# Patient Record
Sex: Female | Born: 1940 | Race: White | Hispanic: No | Marital: Married | Smoking: Former smoker
Health system: Southern US, Community
[De-identification: ages and names within clinical notes are randomized; demographics above are authoritative.]

## PROBLEM LIST (undated history)

## (undated) DIAGNOSIS — I2510 Atherosclerotic heart disease of native coronary artery without angina pectoris: Secondary | ICD-10-CM

## (undated) DIAGNOSIS — C541 Malignant neoplasm of endometrium: Secondary | ICD-10-CM

## (undated) DIAGNOSIS — IMO0001 Reserved for inherently not codable concepts without codable children: Secondary | ICD-10-CM

## (undated) DIAGNOSIS — IMO0002 Reserved for concepts with insufficient information to code with codable children: Secondary | ICD-10-CM

## (undated) DIAGNOSIS — I10 Essential (primary) hypertension: Secondary | ICD-10-CM

## (undated) HISTORY — PX: ABDOMINAL HYSTERECTOMY: SHX81

## (undated) HISTORY — PX: CHOLECYSTECTOMY: SHX55

## (undated) HISTORY — DX: Reserved for inherently not codable concepts without codable children: IMO0001

## (undated) HISTORY — DX: Reserved for concepts with insufficient information to code with codable children: IMO0002

---

## 2015-02-13 HISTORY — PX: ROBOTIC ASSISTED TOTAL HYSTERECTOMY WITH BILATERAL SALPINGO OOPHERECTOMY: SHX6086

## 2015-05-15 ENCOUNTER — Telehealth: Payer: Self-pay | Admitting: *Deleted

## 2015-05-15 NOTE — Telephone Encounter (Signed)
Requested CDs and office notes from Dr. Polly Cobia.  Have pathology

## 2015-05-23 ENCOUNTER — Encounter: Payer: Self-pay | Admitting: Radiation Oncology

## 2015-05-23 NOTE — Progress Notes (Signed)
GYN Location of Tumor / Histology: endometrial cancer  Gina Duncan presented with having postmenopausal bleeding for approximately 2-3 months.   Biopsies revealed:   02/15/15 DIAGNOSIS   1. UTERUS AND BILATERAL ADNEXAE, HYSTERECTOMY AND BILATERAL  SALPINGO-OOPHORECTOMY:  INVASIVE ENDOMETRIAL ADENOCARCINOMA, ENDOMETRIOID TYPE, FIGO GRADE 2  (OF 3).  CERVIX, ATROPHY; NEGATIVE FOR ADENOCARCINOMA.  BILATERAL OVARIES, NEGATIVE FOR ADENOCARCINOMA.  BILATERAL FALLOPIAN TUBES, NEGATIVE FOR ADENOCARCINOMA.  SEE TABLE.  (NLG[RSB])   2. LEFT PELVIC LYMPH NODES, REGIONAL DISSECTION:  SIX LYMPH NODES (OF 6) NEGATIVE FOR CARCINOMA.  (RSB)   3. RIGHT PELVIC LYMPH NODES, REGIONAL DISSECTION:  SEVEN LYMPH NODES (OF 7), NEGATIVE FOR CARCINOMA.  (RSB)     Past/Anticipated interventions by Gyn/Onc surgery, if any: February 13, 2015 where she underwent an uncomplicated robotic hysterectomy with BSO and bilateral pelvic lymph node dissection   Past/Anticipated interventions by medical oncology, if any: Dr. Polly Cobia recommends vaginal cuff brachyetherapy  Weight changes, if any: no  Bowel/Bladder complaints, if any: no  Nausea/Vomiting, if any: no  Pain issues, if any:  no  SAFETY ISSUES:  Prior radiation? no  Pacemaker/ICD? no  Possible current pregnancy? no  Is the patient on methotrexate? no  Current Complaints / other details:  Patient is here with her husband.  She has two children.  Her daughter had breast cancer and recently had chemotherapy here.  BP 189/83 mmHg  Pulse 78  Temp(Src) 98 F (36.7 C) (Oral)  Ht 5\' 5"  (1.651 m)  Wt 190 lb 8 oz (86.41 kg)  BMI 31.70 kg/m2  SpO2 98%

## 2015-05-24 ENCOUNTER — Ambulatory Visit
Admission: RE | Admit: 2015-05-24 | Discharge: 2015-05-24 | Disposition: A | Discharge status: Home / Self Care | Payer: Medicare FFS | Source: Ambulatory Visit | Attending: Radiation Oncology | Admitting: Radiation Oncology

## 2015-05-24 ENCOUNTER — Encounter: Payer: Self-pay | Admitting: Radiation Oncology

## 2015-05-24 VITALS — BP 189/83 | HR 78 | Temp 98.0°F | Ht 65.0 in | Wt 190.5 lb

## 2015-05-24 DIAGNOSIS — Z90722 Acquired absence of ovaries, bilateral: Secondary | ICD-10-CM | POA: Diagnosis not present

## 2015-05-24 DIAGNOSIS — Z510 Encounter for antineoplastic radiation therapy: Secondary | ICD-10-CM | POA: Insufficient documentation

## 2015-05-24 DIAGNOSIS — C541 Malignant neoplasm of endometrium: Secondary | ICD-10-CM | POA: Diagnosis present

## 2015-05-24 DIAGNOSIS — Z803 Family history of malignant neoplasm of breast: Secondary | ICD-10-CM | POA: Insufficient documentation

## 2015-05-24 DIAGNOSIS — Z90710 Acquired absence of both cervix and uterus: Secondary | ICD-10-CM | POA: Insufficient documentation

## 2015-05-24 HISTORY — DX: Essential (primary) hypertension: I10

## 2015-05-24 HISTORY — DX: Malignant neoplasm of endometrium: C54.1

## 2015-05-24 NOTE — Progress Notes (Signed)
Radiation Oncology         [phone] ________________________________  Initial Outpatient Consultation  Name: Gina Duncan MRN: [id number]  Date: 05/24/2015  DOB: 1940-11-12  CC:No primary care provider on file.  Hart Rochester, MD   REFERRING PHYSICIAN: Hart Rochester, MD  DIAGNOSIS: INVASIVE ENDOMETRIAL ADENOCARCINOMA, FIGO Stage IB, grade 2  HISTORY OF PRESENT ILLNESS:Gina Duncan is a 74 y.o. female who is presenting to clinic in regards to her invasive endometrial adenocarcinoma. Unusual vaginal bleeding caused the patient to seek medical attention. Upon examination, Dr. Rosario Adie, MD recommended a biopsy be completed. Biopsy of endometrium showed evidence of endometrial cancer. Dr. Rosario Adie, MD referred the patient to Dr. Polly Cobia, MD for further evaluation. Patient was taken to the operating room on 02/13/2015 at which time she underwent a robotic hysterectomy with BSO and bilateral pelvic node dissection. The patient was found to have a FIGO grade 2 endometrial adenocarcinoma of the uterus. The tumor measured 3.5 x 2.3 x 0.9 cm. Tumor invaded 0.8 cm out of a 1.1 cm myometrial thickness (73%). 13 pelvic lymph nodes were negative for metastatic disease. There was no evidence of lymphovascular space invasion. In light of the above pathologic findings the patient is now referred to radiation oncology for consideration for vaginal brachytherapy as adjuvant treatment.    She reports no symptoms of pain, fatigue, dizziness, difficulty breathing or change in appetite. She reports that she likes to garden, visit the [facility] and use the treadmill. "[place]," the patient's husband confirmed. She presents with no complaint in regards to her recovery from her most recent surgery. She projected a health mental status and was accompanied by her husband. They both currently live in [place], [place] but did not feel comfortable seeking treatment at local  facility. The patient's daughter has underwent chemotherapy here for breast cancer. She expressed that she was scared that she would "start bleeding" again. This concern was addressed.   PREVIOUS RADIATION THERAPY: No  PAST MEDICAL HISTORY:  has a past medical history of Endometrial cancer and Hypertension.    PAST SURGICAL HISTORY: Past Surgical History  Procedure Laterality Date  . Robotic assisted total hysterectomy with bilateral salpingo oopherectomy  02/13/15    by Dr. Polly Cobia    FAMILY HISTORY: family history includes Breast cancer in her daughter.  SOCIAL HISTORY:  reports that she has never smoked. She has never used smokeless tobacco. She reports that she does not drink alcohol or use illicit drugs.  ALLERGIES: Review of patient's allergies indicates no known allergies.  MEDICATIONS:  Current Outpatient Prescriptions  Medication Sig Dispense Refill  . acetaminophen (TYLENOL) 325 MG tablet Take 650 mg by mouth.    Marland Kitchen aspirin 81 MG EC tablet Take 81 mg by mouth.    Marland Kitchen omeprazole (PRILOSEC) 20 MG capsule Take 20 mg by mouth.    . propranolol ER (INDERAL LA) 80 MG 24 hr capsule Take 80 mg by mouth.    . triamterene-hydrochlorothiazide (DYAZIDE) 37.5-25 MG per capsule Take 1 capsule by mouth.     No current facility-administered medications for this encounter.    REVIEW OF SYSTEMS:  A 15 point review of systems is documented in the electronic medical record. This was obtained by the nursing staff. However, I reviewed this with the patient to discuss relevant findings and make appropriate changes.   She reports no symptoms of pain, fatigue, dizziness, difficulty breathing or change in appetite. She reports that she  likes to garden, visit the [facility] and use the treadmill. "[place]," the patient's husband confirmed. She presents with no complaint in regards to her recovery from her most recent surgery. She projected a health mental status and was accompanied by her  husband. They both currently live in [place], [place] but did not feel comfortable seeking treatment at local facility. The patient's daughter has underwent chemotherapy here for breast cancer. She expressed that she was scared that she would "start bleeding" again. This concern was addressed.   PHYSICAL EXAM:  height is 5\' 5"  (1.651 m) and weight is 190 lb 8 oz (86.41 kg). Her oral temperature is 98 F (36.7 C). Her blood pressure is 189/83 and her pulse is 78. Her oxygen saturation is 98%.   BP 189/83 mmHg  Pulse 78  Temp(Src) 98 F (36.7 C) (Oral)  Ht 5\' 5"  (1.651 m)  Wt 190 lb 8 oz (86.41 kg)  BMI 31.70 kg/m2  SpO2 98%  The patient is alert and oriented x3. There is no significant changes to the status of the paients overall health to be noted at this time. Lungs are clear to auscultation bilaterally. Heart has regular rate and rhythm. No palpable cervical, supraclavicular, or axillary adenopathy. Upon physical exam, the patient's surgical scars were noted to heal without sign of swelling, infection, or other irritation.  Abdomen: 4 laparotomy scars which are well healed. Soft, non-tender, bowel sounds active all four quadrants,    no masses, no organomegaly. Pelvic exam deferred until planning day.                                                                  ECOG = 0  LABORATORY DATA:  No results found for: WBC, HGB, HCT, MCV, PLT, NEUTROABS No results found for: NA, K, CL, CO2, GLUCOSE, CREATININE, CALCIUM    RADIOGRAPHY: No results found.    IMPRESSION:  INVASIVE ENDOMETRIAL ADENOCARCINOMA, FIGO Stage IB, grade 2. Patient would be at risk for vaginal cuff recurrence given her pathologic findings. I would recommend high-dose-rate brachytherapy treatments directed at the proximal vagina. Recent exam by Dr. Polly Cobia shows the vaginal cuff to be well healed and intact.  PLAN: The benefits, risks, and purpose of vaginal brachytherapy were reviewed in  detail, including statistics from reputable studies. The patient was educated on common symptoms to expect during radiation treatment and healthy methods of management to address these symptoms if they are to occur. The patient was educated on the traditional process she will experience during vaginal brachytherapy treatments, including the purpose and definition of a CT scan, simulation planning session, and the appropriate time frame to set aside for her 5 sessions of treatment. She will be treated with iridium 192 as the high-dose-rate source She was sent home with additional educational resources that she can access via the Internet. She is aware of her CT scan and simulation planning session to take place as scheduled. She is aware of her follow-up appointment with radiation oncology to take place as scheduled and to notify Dr. Sondra Come, MD if any symptoms of concern occur. All vocalized questions and concerns have been addressed. If the patient develops any further questions or concerns in regards to her treatment and recovery,  she has been encouraged to contact Dr. Sondra Come, MD. Consent documentation was reviewed and signed.    This document serves as a record of services personally performed by Gery Pray, MD. It was created on his behalf by Lenn Cal, a trained medical scribe. The creation of this record is based on the scribe's personal observations and the provider's statements to them. This document has been checked and approved by the attending provider.    ------------------------------------------------  Blair Promise, PhD, MD

## 2015-05-25 ENCOUNTER — Telehealth: Payer: Self-pay | Admitting: *Deleted

## 2015-05-25 NOTE — Telephone Encounter (Signed)
CALLED PATIENT TO INFORM OF NEW HDR VAG. CUFF CASE, SPOKE WITH PATIENT AND SHE IS AWARE OF THESE APPTS. 

## 2015-05-31 ENCOUNTER — Telehealth: Payer: Self-pay | Admitting: *Deleted

## 2015-05-31 NOTE — Telephone Encounter (Signed)
CALLED PATIENT TO REMIND OF HDR VAG. CUFF CASE FOR 06-01-15, LVM FOR A RETURN CALL

## 2015-06-01 ENCOUNTER — Ambulatory Visit
Admission: RE | Admit: 2015-06-01 | Discharge: 2015-06-01 | Disposition: A | Discharge status: Home / Self Care | Payer: Medicare FFS | Source: Ambulatory Visit | Attending: Radiation Oncology | Admitting: Radiation Oncology

## 2015-06-01 ENCOUNTER — Encounter: Payer: Self-pay | Admitting: Radiation Oncology

## 2015-06-01 VITALS — BP 194/105 | HR 72 | Temp 98.2°F | Resp 16 | Ht 65.0 in | Wt 190.0 lb

## 2015-06-01 DIAGNOSIS — C541 Malignant neoplasm of endometrium: Secondary | ICD-10-CM

## 2015-06-01 DIAGNOSIS — Z510 Encounter for antineoplastic radiation therapy: Secondary | ICD-10-CM | POA: Diagnosis not present

## 2015-06-01 NOTE — Progress Notes (Signed)
Gina Duncan here for follow up.  She denies pain and does not have any vaginal bleeding.  Her bp was high today at 194/105.  She did take inderal and dyazide this morning.  BP 194/105 mmHg  Pulse 72  Temp(Src) 98.2 F (36.8 C) (Oral)  Resp 16  Ht 5\' 5"  (1.651 m)  Wt 190 lb (86.183 kg)  BMI 31.62 kg/m2

## 2015-06-01 NOTE — Progress Notes (Signed)
  Radiation Oncology         [phone] ________________________________  Name: Gina Duncan. Levenhagen MRN: [id number]  Date: 06/01/2015  DOB: 1941-08-31   HDR BRACHYTHERAPY   DIAGNOSIS: INVASIVE ENDOMETRIAL ADENOCARCINOMA, FIGO Stage IB, grade 2   Vaginal brachytherapy procedure  NARRATIVE: The patient was taken to the high-dose-rate suite and placed in the dorsolithotomy position. The patient's custom vaginal cylinder was placed in the proximal vagina. This was affixed to the CT/MR stabilization plate to prevent slippage. A fiducial marker was placed within the vaginal cylinder. The patient tolerated the vaginal brachytherapy procedure well   Verification simulation note  An AP and lateral film was obtained of the pelvis documenting accurate position of the vaginal cylinder for treatment.   HDR brachytherapy treatment  The remote afterloading catheter was attached to the vaginal cylinder. The patient then proceeded to undergo her first high-dose-rate treatment directed at the vaginal cuff region. The patient was prescribed a dose of 6 gray to be delivered to the vaginal mucosal surface. This was achieved with a total dwell time of 401.1 seconds. The patient was treated with 1 channel using multiple dwell positions. A 4 cm treatment length was prescribed for the patient. Patient tolerated the procedure well. After completion of her treatment a radiation survey was performed documenting return of the iridium source into the gamma med safe.  ________________________________  Blair Promise, PhD, MD

## 2015-06-01 NOTE — Progress Notes (Signed)
  Radiation Oncology         [phone] ________________________________  Name: Gina Duncan. Leyendecker MRN: [id number]  Date: 06/01/2015  DOB: Nov 09, 1940  Simple treatment device  Note  CC: No primary care provider on file.  Hart Rochester, MD    ICD-9-CM ICD-10-CM   1. Endometrial cancer 182.0 C54.1     Diagnosis:   Endometrial cancer    Narrative: Today the patient had construction of her custom vaginal cylinder. She will be treated with a 3 cm segmented cylinder. This was the optimal diameter to distend the vaginal vault without undue discomfort. ____________________________________ Gery Pray, MD

## 2015-06-01 NOTE — Progress Notes (Signed)
  Radiation Oncology         [phone]) [phone] ________________________________  Name: Gina Duncan MRN: [id number]  Date: 06/01/2015  DOB: November 25, 1940  SIMULATION AND TREATMENT PLANNING NOTE HDR BRACHYTHERAPY  DIAGNOSIS: INVASIVE ENDOMETRIAL ADENOCARCINOMA, FIGO Stage IB, grade 2   NARRATIVE:  The patient was brought to the [facility].  Identity was confirmed.  All relevant records and images related to the planned course of therapy were reviewed.  The patient freely provided informed written consent to proceed with treatment after reviewing the details related to the planned course of therapy. The consent form was witnessed and verified by the simulation staff.  Then, the patient was set-up in a stable reproducible  supine position for radiation therapy.  CT images were obtained.  Surface markings were placed.  The CT images were loaded into the planning software.  Then the target and avoidance structures were contoured.  Treatment planning then occurred.  The radiation prescription was entered and confirmed.   I have requested : Brachytherapy Isodose Plan and Dosimetry Calculations to plan the radiation distribution.    PLAN:  The patient will receive 30 Gy in 5 fractions. Patient will be treated with a 3 cm diameter cylinder. Treatment length will be 4 cm. Prescription will be to the vaginal mucosal surface. Iridium 192 will be the high-dose-rate source    ________________________________  Blair Promise, PhD, MD

## 2015-06-01 NOTE — Progress Notes (Signed)
  Radiation Oncology         [phone] ________________________________  Name: Gina Duncan MRN: [id number]  Date: 06/01/2015  DOB: 25-Dec-1940  Vaginal brachytherapy procedure Note  CC: No primary care provider on file.  Hart Rochester, MD    ICD-9-CM ICD-10-CM   1. Endometrial cancer 182.0 C54.1     Diagnosis:  INVASIVE ENDOMETRIAL ADENOCARCINOMA, FIGO Stage IB, grade 2     Narrative:  The patient returns today for for planning and her first high-dose-rate radiation treatment directed at the proximal vagina.  Patient denies any new problems since her initial consultation September 22.  Physical Findings: The patient is in no acute distress. Patient is alert and oriented.  height is 5\' 5"  (1.651 m) and weight is 190 lb (86.183 kg). Her oral temperature is 98.2 F (36.8 C). Her blood pressure is 194/105 and her pulse is 72. Her respiration is 16. .    The patient was taken to the nursing suite and placed in the dorsal lithotomy position. A pelvic exam was performed. No mucosal lesions were noted in the vaginal vault. The vaginal cuff was noted to be intact. No pelvic masses appreciated. The patient proceeded to undergo fitting for her vaginal cylinder. The optimal diameter to distend the vaginal vault without undue discomfort was a 3 cm diameter cylinder.  The patient tolerated the procedure well.                             Impression:  Patient underwent successful fitting for her vaginal brachytherapy procedure  Plan:  Patient will be transferred to the [facility] for planning and eventual treatment later today.  ____________________________________ Gery Pray, MD

## 2015-06-11 ENCOUNTER — Telehealth: Payer: Self-pay | Admitting: *Deleted

## 2015-06-11 NOTE — Telephone Encounter (Signed)
CALLED PATIENT TO REMIND OF HDR TX. FOR 06-12-15 @ 11 AM, SPOKE WITH PATIENT AND SHE IS AWARE OF THIS APPT.

## 2015-06-12 ENCOUNTER — Ambulatory Visit
Admission: RE | Admit: 2015-06-12 | Discharge: 2015-06-12 | Disposition: A | Discharge status: Home / Self Care | Payer: Medicare FFS | Source: Ambulatory Visit | Attending: Radiation Oncology | Admitting: Radiation Oncology

## 2015-06-12 DIAGNOSIS — Z510 Encounter for antineoplastic radiation therapy: Secondary | ICD-10-CM | POA: Diagnosis not present

## 2015-06-12 DIAGNOSIS — C541 Malignant neoplasm of endometrium: Secondary | ICD-10-CM

## 2015-06-12 NOTE — Progress Notes (Signed)
Expand All Collapse All     Radiation Oncology [phone]) [phone] ________________________________  Name: Gina Duncan. MatherlyMRN: [id number] Date: 10/11/16DOB: 1941/08/25  Simple treatment device Note  CC: No primary care provider on file. Hart Rochester, MD    ICD-9-CM ICD-10-CM   1. Endometrial cancer 182.0 C54.1     Diagnosis: Endometrial cancer    Narrative: Today the patient had construction of her custom vaginal cylinder. She will be treated with a 3 cm segmented cylinder. This was the optimal diameter to distend the vaginal vault without undue discomfort. ____________________________________

## 2015-06-12 NOTE — Progress Notes (Signed)
Expand All Collapse All    Radiation Oncology [phone] ________________________________  Name: Gina Gordon. MatherlyMRN: [id number] Date: 10/11/16DOB: Dec 06, 1940   HDR BRACHYTHERAPY   DIAGNOSIS: INVASIVE ENDOMETRIAL ADENOCARCINOMA, FIGO Stage IB, grade 2   Vaginal brachytherapy procedure  NARRATIVE: The patient was taken to the high-dose-rate suite and placed in the dorsolithotomy position. The patient's custom vaginal cylinder was placed in the proximal vagina. This was affixed to the CT/MR stabilization plate to prevent slippage. A fiducial marker was placed within the vaginal cylinder. The patient tolerated the vaginal brachytherapy procedure well   Verification simulation note  An AP and lateral film was obtained of the pelvis documenting accurate position of the vaginal cylinder for treatment.   HDR brachytherapy treatment  The remote afterloading catheter was attached to the vaginal cylinder. The patient then proceeded to undergo her second high-dose-rate treatment directed at the vaginal cuff region. The patient was prescribed a dose of 6 gray to be delivered to the vaginal mucosal surface. This was achieved with a total dwell time of 204.7 seconds. The patient was treated with 1 channel using multiple dwell positions. A 4 cm treatment length was prescribed for the patient. Patient tolerated the procedure well. After completion of her treatment a radiation survey was performed documenting return of the iridium source into the gamma med safe.  ________________________________  Blair Promise, PhD, MD

## 2015-06-14 ENCOUNTER — Telehealth: Payer: Self-pay | Admitting: *Deleted

## 2015-06-14 NOTE — Telephone Encounter (Signed)
CALLED PATIENT TO REMIND OF HDR TX. FOR 06-15-15 @ 10 AM, SPOKE WITH PATIENT AND SHE IS AWARE OF THIS [place].

## 2015-06-15 ENCOUNTER — Ambulatory Visit
Admission: RE | Admit: 2015-06-15 | Discharge: 2015-06-15 | Disposition: A | Discharge status: Home / Self Care | Payer: Medicare FFS | Source: Ambulatory Visit | Attending: Radiation Oncology | Admitting: Radiation Oncology

## 2015-06-15 DIAGNOSIS — C541 Malignant neoplasm of endometrium: Secondary | ICD-10-CM

## 2015-06-15 DIAGNOSIS — Z510 Encounter for antineoplastic radiation therapy: Secondary | ICD-10-CM | POA: Diagnosis not present

## 2015-06-15 NOTE — Progress Notes (Signed)
Expand All Collapse All     Radiation Oncology [phone] ________________________________  Name: Gina Gordon. MatherlyMRN: [id number] Date: 10/14/16DOB: 1941-02-26  Simple treatment device Note  CC: No primary care provider on file. Hart Rochester, MD    ICD-9-CM ICD-10-CM   1. Endometrial cancer 182.0 C54.1     Diagnosis: Endometrial cancer    Narrative: Today the patient had construction of her custom vaginal cylinder. She will be treated with a 3 cm segmented cylinder. This was the optimal diameter to distend the vaginal vault without undue discomfort. ____________________________________ Gery Pray, MD

## 2015-06-15 NOTE — Progress Notes (Signed)
Expand All Collapse All    Expand All Collapse All   Radiation Oncology [phone] ________________________________  Name: Gina Duncan. MatherlyMRN: [id number] Date: 10/14/16DOB: 1941-05-18   HDR BRACHYTHERAPY   DIAGNOSIS: INVASIVE ENDOMETRIAL ADENOCARCINOMA, FIGO Stage IB, grade 2    Vaginal brachytherapy procedure  NARRATIVE: The patient was taken to the high-dose-rate suite and placed in the dorsolithotomy position. The patient's custom vaginal cylinder was placed in the proximal vagina. This was affixed to the CT/MR stabilization plate to prevent slippage. A fiducial marker was placed within the vaginal cylinder. The patient tolerated the vaginal brachytherapy procedure well   Verification simulation note  An AP and lateral film was obtained of the pelvis documenting accurate position of the vaginal cylinder for treatment.   HDR brachytherapy treatment  The remote afterloading catheter was attached to the vaginal cylinder. The patient then proceeded to undergo her third high-dose-rate treatment directed at the vaginal cuff region. The patient was prescribed a dose of 6 gray to be delivered to the vaginal mucosal surface. This was achieved with a total dwell time of 210.5 seconds. The patient was treated with 1 channel using multiple dwell positions. A 4 cm treatment length was prescribed for the patient. Patient tolerated the procedure well. After completion of her treatment a radiation survey was performed documenting return of the iridium source into the gamma med safe.  ________________________________  Blair Promise, PhD, MD

## 2015-06-18 ENCOUNTER — Telehealth: Payer: Self-pay | Admitting: *Deleted

## 2015-06-18 NOTE — Telephone Encounter (Signed)
Called patient to remind of HDR Tx. For 06-19-15 @ 10 am, lvm for a return call

## 2015-06-19 ENCOUNTER — Encounter: Payer: Self-pay | Admitting: Radiation Oncology

## 2015-06-19 ENCOUNTER — Ambulatory Visit
Admission: RE | Admit: 2015-06-19 | Discharge: 2015-06-19 | Disposition: A | Discharge status: Home / Self Care | Payer: Medicare FFS | Source: Ambulatory Visit | Attending: Radiation Oncology | Admitting: Radiation Oncology

## 2015-06-19 DIAGNOSIS — C541 Malignant neoplasm of endometrium: Secondary | ICD-10-CM

## 2015-06-19 DIAGNOSIS — Z510 Encounter for antineoplastic radiation therapy: Secondary | ICD-10-CM | POA: Diagnosis not present

## 2015-06-19 NOTE — Progress Notes (Signed)
Radiation Oncology [phone]) [phone] ________________________________  Name: Gina Duncan. MatherlyMRN: [id number] Date: 10/18/16DOB: 1940/12/07   HDR BRACHYTHERAPY   DIAGNOSIS: INVASIVE ENDOMETRIAL ADENOCARCINOMA, FIGO Stage IB, grade 2  Vaginal brachytherapy procedure  NARRATIVE: The patient was taken to the high-dose-rate suite and placed in the dorsolithotomy position. The patient's custom vaginal cylinder was placed in the proximal vagina. This was affixed to the CT/MR stabilization plate to prevent slippage. A fiducial marker was placed within the vaginal cylinder. The patient tolerated the vaginal brachytherapy procedure well   Verification simulation note  An AP and lateral film was obtained of the pelvis documenting accurate position of the vaginal cylinder for treatment.   HDR brachytherapy treatment  The remote afterloading catheter was attached to the vaginal cylinder. The patient then proceeded to undergo her fourth high-dose-rate treatment directed at the vaginal cuff region. The patient was prescribed a dose of 6 gray to be delivered to the vaginal mucosal surface. This was achieved with a total dwell time of 218.5 seconds. The patient was treated with 1 channel using multiple dwell positions. A 4 cm treatment length was prescribed for the patient. Patient tolerated the procedure well. After completion of her treatment a radiation survey was performed documenting return of the iridium source into the gamma med safe.  ________________________________  Blair Promise, PhD, MD

## 2015-06-19 NOTE — Progress Notes (Signed)
Expand All Collapse All    Expand All Collapse All    Radiation Oncology [phone] ________________________________  Name: Gina Gordon. MatherlyMRN: [id number] Date: 10/18/16DOB: [birth year]  Simple treatment device Note  CC: No primary care provider on file. Hart Rochester, MD    ICD-9-CM ICD-10-CM   1. Endometrial cancer 182.0 C54.1     Diagnosis: Endometrial cancer    Narrative: Today the patient had construction of her custom vaginal cylinder. She will be treated with a 3 cm segmented cylinder. This was the optimal diameter to distend the vaginal vault without undue discomfort. ____________________________________ Gery Pray, MD

## 2015-06-20 ENCOUNTER — Telehealth: Payer: Self-pay | Admitting: *Deleted

## 2015-06-20 NOTE — Telephone Encounter (Signed)
CALLED PATIENT TO REMIND OF HDR TX. FOR 06-21-15 @ 11 AM, SPOKE WITH PATIENT AND SHE IS AWARE OF THIS APPT.

## 2015-06-21 ENCOUNTER — Ambulatory Visit
Admission: RE | Admit: 2015-06-21 | Discharge: 2015-06-21 | Disposition: A | Discharge status: Home / Self Care | Payer: Medicare FFS | Source: Ambulatory Visit | Attending: Radiation Oncology | Admitting: Radiation Oncology

## 2015-06-21 ENCOUNTER — Encounter: Payer: Self-pay | Admitting: Radiation Oncology

## 2015-06-21 DIAGNOSIS — C541 Malignant neoplasm of endometrium: Secondary | ICD-10-CM

## 2015-06-21 DIAGNOSIS — Z510 Encounter for antineoplastic radiation therapy: Secondary | ICD-10-CM | POA: Diagnosis not present

## 2015-06-21 NOTE — Progress Notes (Signed)
Expand All Collapse All    Radiation Oncology [phone]) [phone] ________________________________  Name: Gina Gordon. MatherlyMRN: [id number] Date: 10/20/16DOB: Sep 05, 1940  Simple treatment device Note  CC: No primary care provider on file. Hart Rochester, MD    ICD-9-CM ICD-10-CM   1. Endometrial cancer 182.0 C54.1     Diagnosis: Endometrial cancer    Narrative: Today the patient had construction of her custom vaginal cylinder. She will be treated with a 3 cm segmented cylinder. This was the optimal diameter to distend the vaginal vault without undue discomfort. ____________________________________ Gery Pray, MD

## 2015-06-21 NOTE — Progress Notes (Signed)
Radiation Oncology [phone]) [phone] ________________________________  Name: Gina Gordon. MatherlyMRN: [id number] Date: 10/20/16DOB: 1941/04/29   HDR BRACHYTHERAPY   DIAGNOSIS: INVASIVE ENDOMETRIAL ADENOCARCINOMA, FIGO Stage IB, grade 2  Vaginal brachytherapy procedure  NARRATIVE: The patient was taken to the high-dose-rate suite and placed in the dorsolithotomy position. The patient's custom vaginal cylinder was placed in the proximal vagina. This was affixed to the CT/MR stabilization plate to prevent slippage. A fiducial marker was placed within the vaginal cylinder. The patient tolerated the vaginal brachytherapy procedure well   Verification simulation note  An AP and lateral film was obtained of the pelvis documenting accurate position of the vaginal cylinder for treatment.   HDR brachytherapy treatment  The remote afterloading catheter was attached to the vaginal cylinder. The patient then proceeded to undergo her fifth high-dose-rate treatment directed at the vaginal cuff region. The patient was prescribed a dose of 6 gray to be delivered to the vaginal mucosal surface. This was achieved with a total dwell time of 222.8 seconds. The patient was treated with 1 channel using multiple dwell positions. A 4 cm treatment length was prescribed for the patient. Patient tolerated the procedure well. After completion of her treatment a radiation survey was performed documenting return of the iridium source into the gamma med safe.  This document serves as a record of services personally performed by Gery Pray, MD. It was created on his behalf by Darcus Austin, a trained medical scribe. The creation of this record is based on the scribe's personal observations and the provider's statements to them. This document has been checked and approved by the attending provider.  ________________________________  Blair Promise, PhD, MD

## 2015-06-27 ENCOUNTER — Encounter: Payer: Self-pay | Admitting: Radiation Oncology

## 2015-06-27 ENCOUNTER — Ambulatory Visit: Payer: Medicare FFS | Admitting: Radiation Oncology

## 2015-07-04 ENCOUNTER — Encounter: Payer: Self-pay | Admitting: Radiation Oncology

## 2015-07-10 NOTE — Progress Notes (Signed)
  Radiation Oncology         [phone]) [phone] ________________________________  Name: Gina Duncan. Wiers MRN: [id number]  Date: 06/21/2015  DOB: [birth year]  End of Treatment Note  DIAGNOSIS: INVASIVE ENDOMETRIAL ADENOCARCINOMA, FIGO Stage IB, grade 2   Indication for treatment:  Postop, risk for vaginal cuff recurrence       Radiation treatment dates:   06/01/2015, 06/12/2015, 06/15/2015, 06/19/2015, 06/21/2015  Site/dose:   Proximal vagina 30 gray in 5 fractions  Beams/energy:   High-dose-rate brachytherapy treatments using iridium 192 as the high-dose-rate source. The patient was treated with a 3 cm diameter segmented cylinder. Treatment length was 4 cm. Prescription to the mucosal surface  Narrative: The patient tolerated radiation treatment relatively well.  Mild discomfort within the vaginal area with treatment and mild urinary symptoms  Plan: The patient has completed radiation treatment. The patient will return to radiation oncology clinic for routine followup in one month. I advised them to call or return sooner if they have any questions or concerns related to their recovery or treatment.  -----------------------------------  Blair Promise, PhD, MD

## 2015-07-30 ENCOUNTER — Encounter: Payer: Self-pay | Admitting: Oncology

## 2015-08-02 ENCOUNTER — Ambulatory Visit
Admission: RE | Admit: 2015-08-02 | Discharge: 2015-08-02 | Disposition: A | Discharge status: Home / Self Care | Payer: Medicare FFS | Source: Ambulatory Visit | Attending: Radiation Oncology | Admitting: Radiation Oncology

## 2015-08-02 ENCOUNTER — Encounter: Payer: Self-pay | Admitting: Radiation Oncology

## 2015-08-02 VITALS — BP 177/66 | HR 79 | Temp 98.3°F | Resp 12 | Wt 191.2 lb

## 2015-08-02 DIAGNOSIS — C541 Malignant neoplasm of endometrium: Secondary | ICD-10-CM

## 2015-08-02 NOTE — Progress Notes (Signed)
PAIN: She is currently in no pain. URINARY: Denies changes.  Pt states they urinate 0 - 1 times per night.   VAGINAL: Denies vaginal bleeding or discharge. No abdominal discomfort/pain. BOWEL: Pt reports a soft bowel movement every/everyother day.  Occasional constipation will take Miralax.  OTHER: Pt reports active lifestyle with walking and exercises at the [facility]   BP 177/66 mmHg  Pulse 79  Temp(Src) 98.3 F (36.8 C) (Oral)  Resp 12  Wt 191 lb 3.2 oz (86.728 kg)  SpO2 95% Wt Readings from Last 3 Encounters:  08/02/15 191 lb 3.2 oz (86.728 kg)  06/01/15 190 lb (86.183 kg)  05/24/15 190 lb 8 oz (86.41 kg)

## 2015-08-02 NOTE — Progress Notes (Signed)
  Radiation Oncology         [phone]) [phone] ________________________________  Name: Gina Duncan MRN: [id number]  Date: 08/02/2015  DOB: [birth year]  Follow-Up Visit Note  CC: No primary care provider on file.  Hart Rochester, MD   Diagnosis: INVASIVE ENDOMETRIAL ADENOCARCINOMA, FIGO Stage IB, grade 2   Interval Since Last Radiation: 6  weeks. 06/01/2015, 06/12/2015, 06/15/2015, 06/19/2015, 06/21/2015  Site/dose:   Proximal vagina 30 gray in 5 fractions  Narrative:  The patient returns today for routine follow-up. The patient denies pain or any urinary changes with no nocturia. She denies vaginal bleeding/discharge, abdominal pain, or bowel issues (except for occasional constipation in which she takes Miralax). She reports living an active lifestyle by walking and exercising at the [facility]. She saw Dr. Polly Cobia on 07/05/15.  ALLERGIES:  has No Known Allergies.  Meds: Current Outpatient Prescriptions  Medication Sig Dispense Refill  . acetaminophen (TYLENOL) 325 MG tablet Take 650 mg by mouth.    Marland Kitchen aspirin 81 MG EC tablet Take 81 mg by mouth.    Marland Kitchen omeprazole (PRILOSEC) 20 MG capsule Take 20 mg by mouth.    . propranolol ER (INDERAL LA) 80 MG 24 hr capsule Take 80 mg by mouth.    . triamterene-hydrochlorothiazide (DYAZIDE) 37.5-25 MG per capsule Take 1 capsule by mouth.     No current facility-administered medications for this encounter.    Physical Findings: The patient is in no acute distress. Patient is alert and oriented.  weight is 191 lb 3.2 oz (86.728 kg). Her oral temperature is 98.3 F (36.8 C). Her blood pressure is 177/66 and her pulse is 79. Her respiration is 12 and oxygen saturation is 95%.  Lungs are clear to auscultation bilaterally. Heart has regular rate and rhythm. No palpable cervical, supraclavicular, or axillary adenopathy.  Lab Findings: No results found for: WBC, HGB, HCT, MCV, PLT  Radiographic Findings: No results found.  Impression:  The patient  is recovering from the effects of radiation. No pelvic exam was performed in light of her recent radiation treatment.  Plan: The patient was given a vaginal dilator and was instructed of its use by the MD and nurse. The patient reports she will be seen by Dr. Polly Cobia in February. She will then follow up with me in May. I also offered follow-up in the [place] at our satellite location where the patient lives.   ____________________________________  Blair Promise, PhD, MD  This document serves as a record of services personally performed by Gery Pray, MD. It was created on his behalf by Darcus Austin, a trained medical scribe. The creation of this record is based on the scribe's personal observations and the provider's statements to them. This document has been checked and approved by the attending provider.

## 2015-09-02 HISTORY — PX: OTHER SURGICAL HISTORY: SHX169

## 2015-09-11 ENCOUNTER — Telehealth: Payer: Self-pay | Admitting: Oncology

## 2015-09-11 NOTE — Telephone Encounter (Addendum)
Twisha's daughter called and said she is and inpatient at [facility] with chest pain and postiive CPK.  She is wondering if Dr. Sondra Come can recommend a cardiologist as she may have to have a cardiac craterization and will have a stress test today.

## 2015-09-11 NOTE — Telephone Encounter (Signed)
Called Terry back and advised her that Dr. Sondra Come is not familiar with the cardiologists that practice at [facility].  Coralyn Mark said her mother would most likely be transferred to [facility] and she is also trying to get a recommendation from Dr. Shelba Flake office as well.

## 2015-09-14 DIAGNOSIS — I2510 Atherosclerotic heart disease of native coronary artery without angina pectoris: Secondary | ICD-10-CM | POA: Diagnosis present

## 2015-09-24 DIAGNOSIS — I10 Essential (primary) hypertension: Secondary | ICD-10-CM | POA: Diagnosis present

## 2016-01-24 ENCOUNTER — Ambulatory Visit
Admission: RE | Admit: 2016-01-24 | Discharge: 2016-01-24 | Disposition: A | Discharge status: Home / Self Care | Payer: Medicare FFS | Source: Ambulatory Visit | Attending: Radiation Oncology | Admitting: Radiation Oncology

## 2016-01-24 ENCOUNTER — Ambulatory Visit: Payer: Medicare FFS | Admitting: Radiation Oncology

## 2016-01-24 DIAGNOSIS — C541 Malignant neoplasm of endometrium: Secondary | ICD-10-CM

## 2020-11-15 ENCOUNTER — Emergency Department (HOSPITAL_COMMUNITY)
Admission: EM | Admit: 2020-11-15 | Discharge: 2020-11-15 | Disposition: A | Discharge status: Home / Self Care | Payer: Medicare PPO | Attending: Emergency Medicine | Admitting: Emergency Medicine

## 2020-11-15 ENCOUNTER — Other Ambulatory Visit: Payer: Self-pay

## 2020-11-15 ENCOUNTER — Emergency Department (HOSPITAL_COMMUNITY): Payer: Medicare PPO

## 2020-11-15 ENCOUNTER — Encounter (HOSPITAL_COMMUNITY): Payer: Self-pay | Admitting: *Deleted

## 2020-11-15 DIAGNOSIS — R9389 Abnormal findings on diagnostic imaging of other specified body structures: Secondary | ICD-10-CM | POA: Diagnosis not present

## 2020-11-15 DIAGNOSIS — W010XXA Fall on same level from slipping, tripping and stumbling without subsequent striking against object, initial encounter: Secondary | ICD-10-CM | POA: Diagnosis not present

## 2020-11-15 DIAGNOSIS — I10 Essential (primary) hypertension: Secondary | ICD-10-CM | POA: Diagnosis not present

## 2020-11-15 DIAGNOSIS — Z7982 Long term (current) use of aspirin: Secondary | ICD-10-CM | POA: Insufficient documentation

## 2020-11-15 DIAGNOSIS — Z79899 Other long term (current) drug therapy: Secondary | ICD-10-CM | POA: Diagnosis not present

## 2020-11-15 DIAGNOSIS — S50812A Abrasion of left forearm, initial encounter: Secondary | ICD-10-CM | POA: Diagnosis not present

## 2020-11-15 DIAGNOSIS — S0011XA Contusion of right eyelid and periocular area, initial encounter: Secondary | ICD-10-CM | POA: Insufficient documentation

## 2020-11-15 DIAGNOSIS — W19XXXA Unspecified fall, initial encounter: Secondary | ICD-10-CM

## 2020-11-15 DIAGNOSIS — S0990XA Unspecified injury of head, initial encounter: Secondary | ICD-10-CM | POA: Diagnosis present

## 2020-11-15 DIAGNOSIS — R918 Other nonspecific abnormal finding of lung field: Secondary | ICD-10-CM

## 2020-11-15 DIAGNOSIS — Z8542 Personal history of malignant neoplasm of other parts of uterus: Secondary | ICD-10-CM | POA: Insufficient documentation

## 2020-11-15 MED ORDER — AZITHROMYCIN 250 MG PO TABS: ORAL_TABLET | ORAL | 0 refills | Status: AC

## 2020-11-15 MED ORDER — ACETAMINOPHEN 325 MG PO TABS
650.0000 mg | ORAL_TABLET | Freq: Once | ORAL | Status: AC
  Administered 2020-11-15: 650 mg via ORAL
  Filled 2020-11-15: qty 2

## 2020-11-15 NOTE — ED Provider Notes (Signed)
Accidental trip and fall just prior to arrival, no loss of consciousness, on exam has a periorbital hematoma to the lateral orbit, no signs of facial droop, normal mental status, minimal headache.  CT scan reveals some abnormal findings such as a possible polyp or fungal problem with the maxillary sinus as well as a tree-in-bud type pattern in the upper lung seen on neck CT.  No signs of significant traumatic injury, patient is having no symptoms of respiratory disease, will need to follow-up with ENT as well as family doctor, 5 days of Zithromax.  Patient agreeable  Medical screening examination/treatment/procedure(s) were conducted as a shared visit with non-physician practitioner(s) and myself.  I personally evaluated the patient during the encounter.  Clinical Impression:   Final diagnoses:  Fall, initial encounter  Abnormal CT scan  Hypertension, unspecified type  Abnormal finding on lung imaging         Noemi Chapel, MD 11/18/20 1024

## 2020-11-15 NOTE — ED Provider Notes (Signed)
[facility] EMERGENCY DEPARTMENT Provider Note   CSN: [id number] Arrival date & time: 11/15/20  1457     History Chief Complaint  Patient presents with  . Head Injury    Gina Duncan is a 80 y.o. female with a past medical history of endometrial cancer, hypertension, who presents today for evaluation after a mechanical nonsyncopal fall.  She states that she was out in the yard when she tripped causing her to fall.  She states that she had "stuff" in her hands and was unable to get herself.  She states that she struck the right side of her face on the ground.  She says that her family told her she needed to come in to get "checked out."  She denies loss of consciousness or any prodromal symptoms.  She denies any vision changes or headache.  She does not take any blood thinning medications.  She states that she does not have any pain in her bilateral arms or legs.  Had a small abrasion on the left forearm that she covered with a bandage PTA.   No pain in her neck, back, hips or abdomen.    HPI     Past Medical History:  Diagnosis Date  . Endometrial cancer ([place])   . Hypertension   . Radiation 06/01/15, 06/12/15, 06/15/15, 06/19/15, 06/21/15   proximal vagina 30 gray    Patient Active Problem List   Diagnosis Date Noted  . Endometrial cancer ([place]) 05/24/2015    Past Surgical History:  Procedure Laterality Date  . ROBOTIC ASSISTED TOTAL HYSTERECTOMY WITH BILATERAL SALPINGO OOPHERECTOMY  02/13/15   by Dr. Polly Cobia     OB History   No obstetric history on file.     Family History  Problem Relation Age of Onset  . Breast cancer Daughter     Social History   Tobacco Use  . Smoking status: Never Smoker  . Smokeless tobacco: Never Used  Substance Use Topics  . Alcohol use: No    Alcohol/week: 0.0 standard drinks  . Drug use: No    Home Medications Prior to Admission medications   Medication Sig Start Date End Date Taking? Authorizing Provider  azithromycin  (ZITHROMAX) 250 MG tablet Take 2 tablets (500 mg total) by mouth daily for 1 day, THEN 1 tablet (250 mg total) daily for 4 days. 11/15/20 11/20/20 Yes Lorin Glass, PA-C  acetaminophen (TYLENOL) 325 MG tablet Take 650 mg by mouth.    [provider]  aspirin 81 MG EC tablet Take 81 mg by mouth.    [provider]  omeprazole (PRILOSEC) 20 MG capsule Take 20 mg by mouth.    [provider]  propranolol ER (INDERAL LA) 80 MG 24 hr capsule Take 80 mg by mouth.    [provider]  triamterene-hydrochlorothiazide (DYAZIDE) 37.5-25 MG per capsule Take 1 capsule by mouth.    [provider]    Allergies    Patient has no known allergies.  Review of Systems   Review of Systems  Constitutional: Negative for chills and fever.  HENT: Positive for facial swelling.   Eyes: Negative for pain and visual disturbance.  Respiratory: Negative for cough, chest tightness and shortness of breath.   Cardiovascular: Negative for chest pain.  Gastrointestinal: Negative for abdominal pain.  Musculoskeletal: Negative for back pain and neck pain.  Skin: Positive for wound.  Neurological: Negative for syncope, weakness and headaches.  Psychiatric/Behavioral: Negative for confusion.    Physical Exam Updated Vital  Signs BP (!) 159/67 (BP Location: Left Arm)   Pulse 63   Temp 97.6 F (36.4 C) (Oral)   Resp 18   SpO2 100%   Physical Exam Vitals and nursing note reviewed.  Constitutional:      General: She is not in acute distress.    Appearance: She is not diaphoretic.  HENT:     Head: Normocephalic.     Comments: There is a approx 2cm diameter contusion on the right sided lateral orbit.  Full EOMs.  Pupils are equal round reactive to light.  No nystagmus. Eyes:     General: No scleral icterus.       Right eye: No discharge.        Left eye: No discharge.     Conjunctiva/sclera: Conjunctivae normal.  Neck:     Comments: No midline C/upper T-spine  tenderness palpation, step-offs, or deformities.   Cardiovascular:     Rate and Rhythm: Normal rate and regular rhythm.  Pulmonary:     Effort: Pulmonary effort is normal. No respiratory distress.     Breath sounds: No stridor.  Abdominal:     General: There is no distension.  Musculoskeletal:        General: No deformity.     Cervical back: No tenderness.     Comments: Patient has full active range of motion of bilateral arms and legs, is able to grossly range all major joints in bilateral arms and legs without significant pain.  Skin:    General: Skin is warm and dry.  Neurological:     Mental Status: She is alert.     Motor: No abnormal muscle tone.  Psychiatric:        Behavior: Behavior normal.     ED Results / Procedures / Treatments   Labs (all labs ordered are listed, but only abnormal results are displayed) Labs Reviewed - No data to display  EKG None  Radiology CT Head Wo Contrast  Result Date: 11/15/2020 CLINICAL DATA:  Fall in the yard with bruising about right eye. No loss of consciousness. EXAM: CT HEAD WITHOUT CONTRAST TECHNIQUE: Contiguous axial images were obtained from the base of the skull through the vertex without intravenous contrast. COMPARISON:  None. FINDINGS: Brain: No intracranial hemorrhage, mass effect, or midline shift. Brain volume is normal for age. No hydrocephalus. The basilar cisterns are patent. Punctate left basal ganglia mineralization is likely senescent. No evidence of territorial infarct or acute ischemia. No extra-axial or intracranial fluid collection. Vascular: No hyperdense vessel. Skull: No fracture or focal lesion. Sinuses/Orbits: Assessed on concurrent face CT, reported separately. Other: None. IMPRESSION: No acute intracranial abnormality. No skull fracture. Electronically Signed   By: Keith Rake M.D.   On: 11/15/2020 17:42   CT Cervical Spine Wo Contrast  Result Date: 11/15/2020 CLINICAL DATA:  Neck trauma (Age >= 65y) Fall  in yard. EXAM: CT CERVICAL SPINE WITHOUT CONTRAST TECHNIQUE: Multidetector CT imaging of the cervical spine was performed without intravenous contrast. Multiplanar CT image reconstructions were also generated. COMPARISON:  None. FINDINGS: Alignment: Straightening of normal lordosis. No traumatic subluxation. Skull base and vertebrae: No acute fracture. Vertebral body heights are maintained. The dens and skull base are intact. Degenerative pannus at C1-C2. Small bone island in right transverse process of T1. Soft tissues and spinal canal: No prevertebral fluid or swelling. No visible canal hematoma. Disc levels: Disc space narrowing from C4-C5 through C6-C7. There are anterior spurs as well as posterior disc osteophyte complexes at this level.  This causes bony neural foraminal stenosis at C5-C6. Left neural foraminal stenosis at C4-C5 and C5-C6. Scattered multilevel facet hypertrophy. Upper chest: Biapical tree-in-bud opacities.  No traumatic findings. Other: Carotid calcifications. IMPRESSION: 1. No acute fracture or traumatic subluxation of the cervical spine. 2. Multilevel degenerative disc disease and facet hypertrophy. Partially calcified posterior disc osteophyte complex at C5-C6 causes bony canal narrowing. 3. Biapical tree-in-bud opacities in the included lung apices, suspicious for bronchiolitis. This may be related to atypical infection or potentially aspiration. Recommend correlation for aspiration risk factors. Electronically Signed   By: Keith Rake M.D.   On: 11/15/2020 17:51   CT Maxillofacial WO CM  Result Date: 11/15/2020 CLINICAL DATA:  Facial trauma. Fall on right side of face. Bruising right-sided by. EXAM: CT MAXILLOFACIAL WITHOUT CONTRAST TECHNIQUE: Multidetector CT imaging of the maxillofacial structures was performed. Multiplanar CT image reconstructions were also generated. COMPARISON:  None. FINDINGS: Osseous: The nasal bone, zygomatic arches, and mandibles are intact. The  temporomandibular joints are congruent. Nasal septum is midline. No fracture or pterygoid plates. Poor dentition with scattered periapical lucencies. Orbits: No acute orbital fracture. Both orbits and globes are intact. Sinuses: No sinus fracture. Right maxillary sinus is filled with hyperdense and partially calcified contents. There is mild bulging of the medial wall into the right nasal cavity. Trace mucosal thickening of left side of sphenoid sinus. Mastoid air cells are clear. Soft tissues: Right periorbital soft tissue edema. Limited intracranial: Assessed on concurrent head CT, reported separately. IMPRESSION: 1. Right periorbital soft tissue edema. No orbital or facial bone fracture. 2. Poor dentition with scattered periapical lucencies. 3. Right maxillary sinus is filled with hyperdense and partially calcified contents. There is mild bulging of the medial wall into the right nasal cavity. Findings are suspicious for fungal infection, possibility of underlying polyp is also considered. Consider elective ENT follow-up. Electronically Signed   By: Keith Rake M.D.   On: 11/15/2020 17:47    Procedures Procedures   Medications Ordered in ED Medications  acetaminophen (TYLENOL) tablet 650 mg (650 mg Oral Given 11/15/20 1633)    ED Course  I have reviewed the triage vital signs and the nursing notes.  Pertinent labs & imaging results that were available during my care of the patient were reviewed by me and considered in my medical decision making (see chart for details).    MDM Rules/Calculators/A&P                         Patient is a 80 year old woman who presents today for evaluation after mechanical nonsyncopal fall striking the right side of her head on the ground.  She does have a superficial abrasion that is small on her forearm however does not have evidence of significant injuries to bilateral hands or legs and has been ambulatory since.  She does not take blood thinning  medications. CT head, face, neck was obtained given mechanism, contusion, along with her age putting her at increased risk.  CT max face did not show evidence of acute abnormalities however did show poor dentition and that the right maxillary sinus is filled with hyperdense and possibly calcified contents with suspicion for fungal infection or underlying polyp. Patient was informed of this result and given outpatient ENT follow-up. Patient CT scanning on her head and neck are without acute abnormalities however CT scan of C-spine did show in the lung apices bilaterally there is tree-in-bud opacities.  Patient denies cough, chest pain or shortness of  breath, denies any recent aspiration type events.  We will treat her with a course of azithromycin.  I recommended that she call her primary care doctor and get seen in the next 5 days for recheck and consideration of additional therapy if needed.  Patient is given her CT scan results to bring to her PCP.  This patient was seen as a shared visit with Dr. Sabra Heck.  Return precautions were discussed with patient who states their understanding.  At the time of discharge patient denied any unaddressed complaints or concerns.  Patient is agreeable for discharge home.  Note: Portions of this report may have been transcribed using voice recognition software. Every effort was made to ensure accuracy; however, inadvertent computerized transcription errors may be present  Final Clinical Impression(s) / ED Diagnoses Final diagnoses:  Fall, initial encounter  Abnormal CT scan  Hypertension, unspecified type  Abnormal finding on lung imaging    Rx / DC Orders ED Discharge Orders         Ordered    azithromycin (ZITHROMAX) 250 MG tablet        11/15/20 1822           Ollen Gross 11/15/20 1921    Noemi Chapel, MD 11/18/20 1024

## 2020-11-15 NOTE — ED Notes (Signed)
Ice pack to right forehead.

## 2020-11-15 NOTE — Discharge Instructions (Signed)
Today your CT scan showed concern for a large polyp or fungal infection of the right maxillary sinus.  If you do not already see an ENT doctor please schedule a appointment with Dr. Benjamine Mola for follow-up and further evaluation of this.   Additionally on the CT scan of your neck they pictures of the top part of your lungs showed concern for what we call an atypical infection.  You have been given antibiotics for this.  Please call your primary care doctor tomorrow to set up an appointment.  You should be seen in the next 5 days.  Additionally please verify with your primary care doctor that your tetanus is up-to-date.  If you develop fevers, weakness, or any other new or concerning symptoms please seek additional medical care and evaluation.  While in the emergency room your blood pressure was elevated.  This may be due to the stress of being in the emergency room however I would recommend that you follow-up with your primary care doctor and get this rechecked in the next few weeks as if it remains elevated you may need a change in your medications.  Please take Tylenol (acetaminophen) to relieve your pain.  You may take tylenol, up to 1,000 mg (two extra strength pills).  Do not take more than 3,000 mg tylenol in a 24 hour period.  Please check all medication labels as many medications such as pain and cold medications may contain tylenol. Please do not drink alcohol while taking this medication.

## 2020-11-15 NOTE — ED Triage Notes (Signed)
Fell in the yard. Bruised area right side of eye, no LOC

## 2022-03-20 IMAGING — CT CT HEAD W/O CM
3 series · 16 of 47 positions shown, 19 images · non-contrast
Comparison: None.

CLINICAL DATA: Fall in the yard with bruising about right eye. No
loss of consciousness.

EXAM:
CT HEAD WITHOUT CONTRAST
TECHNIQUE: Contiguous axial images were obtained from the base of the skull
through the vertex without intravenous contrast.

[Series 3: head w o · axial · 0.47mm/px · z∈[+63,+198]mm · 10 of 33 slices shown, 13 images]
[im 3/33  brain]
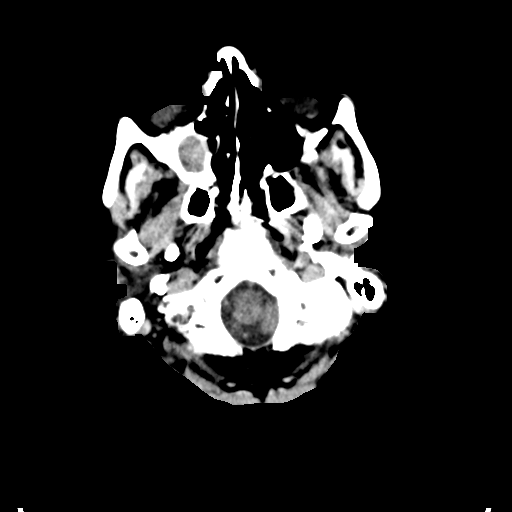
[im 3/33  bone]
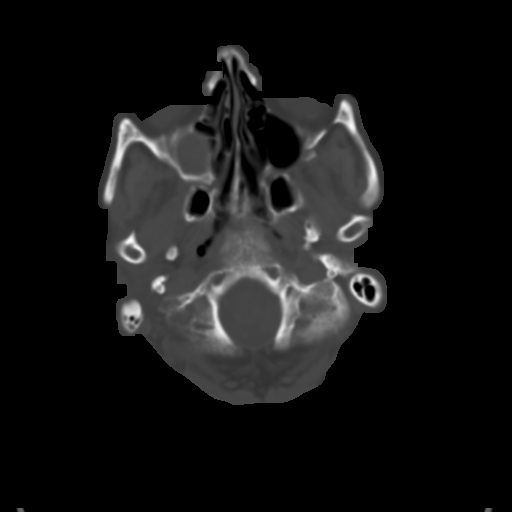
[im 6/33  brain]
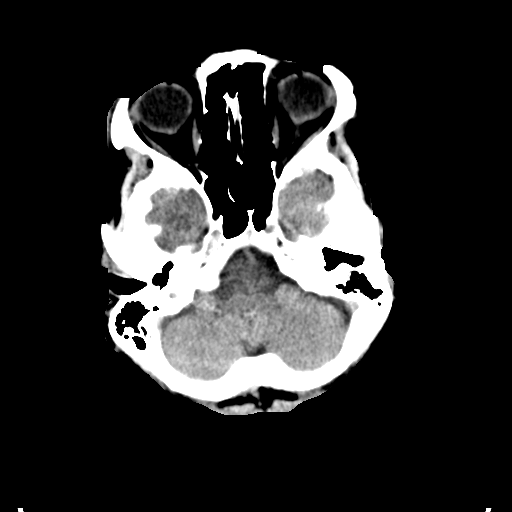
[im 9/33  brain]
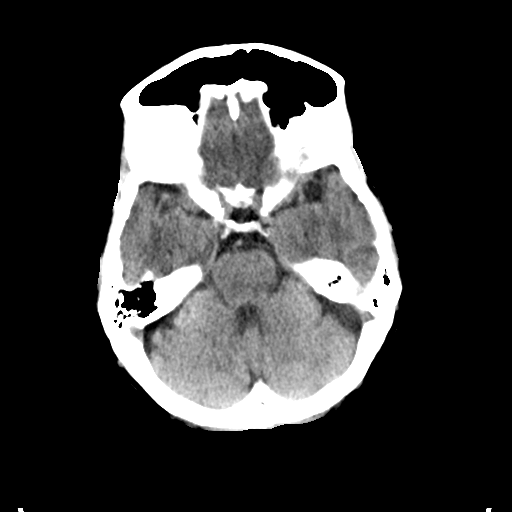
[im 12/33  brain]
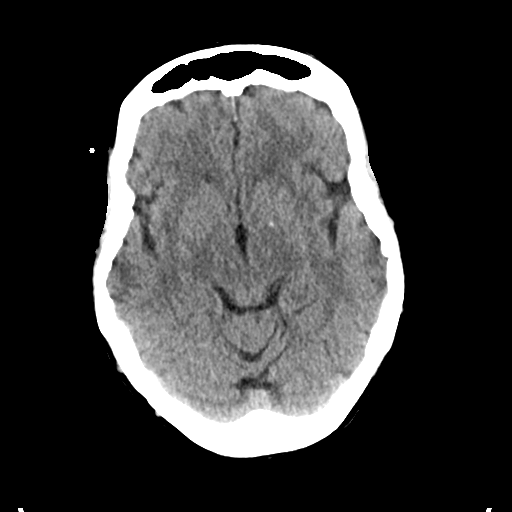
[im 15/33  brain]
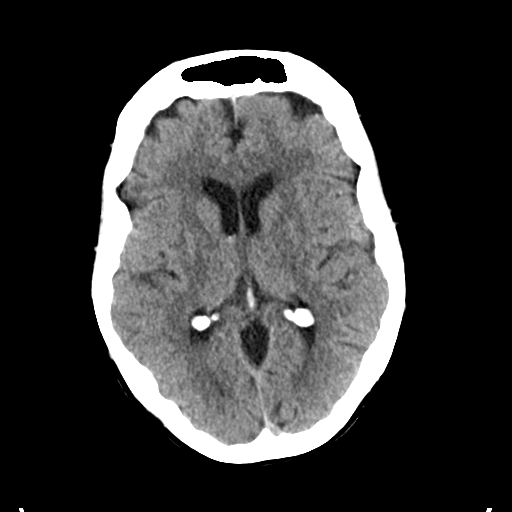
[im 15/33  bone]
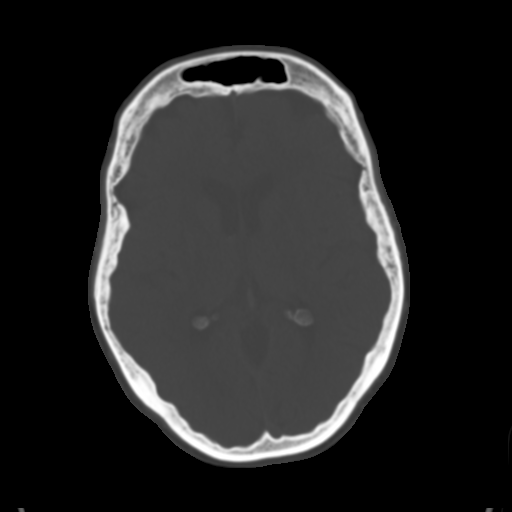
[im 18/33  brain]
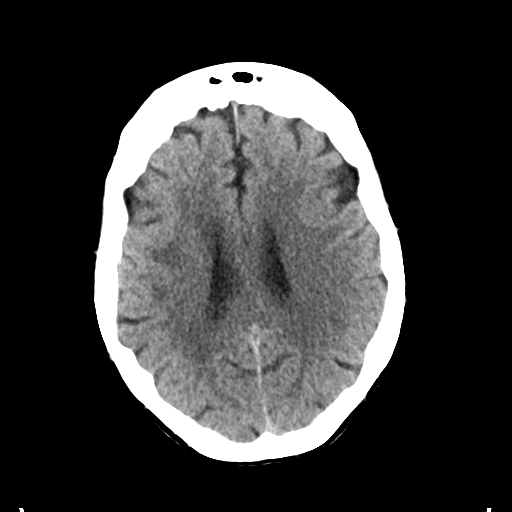
[im 21/33  brain]
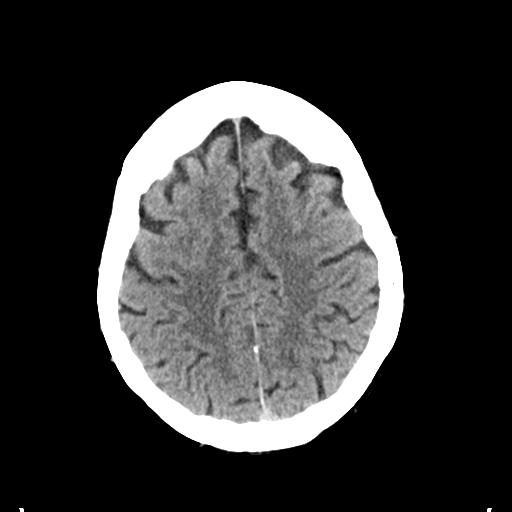
[im 25/33  brain]
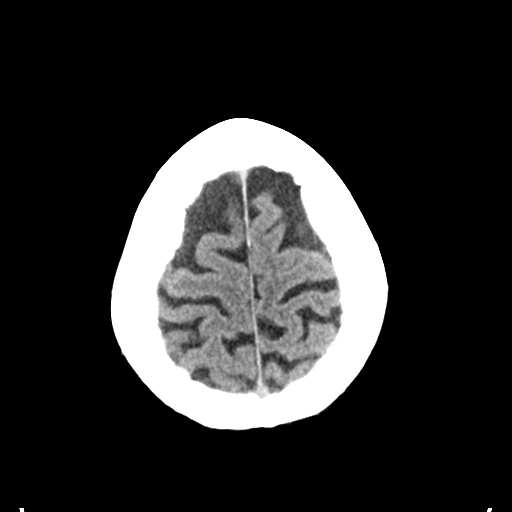
[im 27/33  brain]
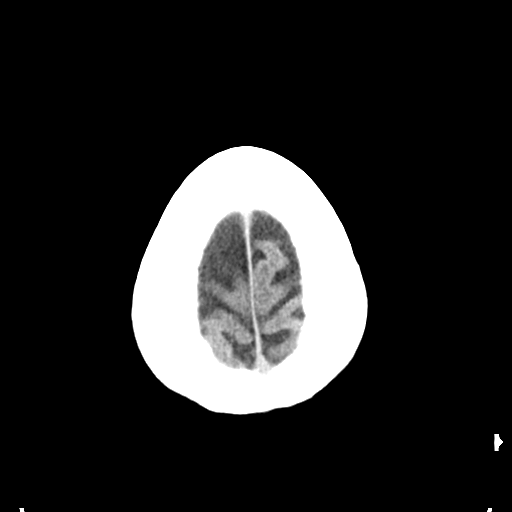
[im 27/33  bone]
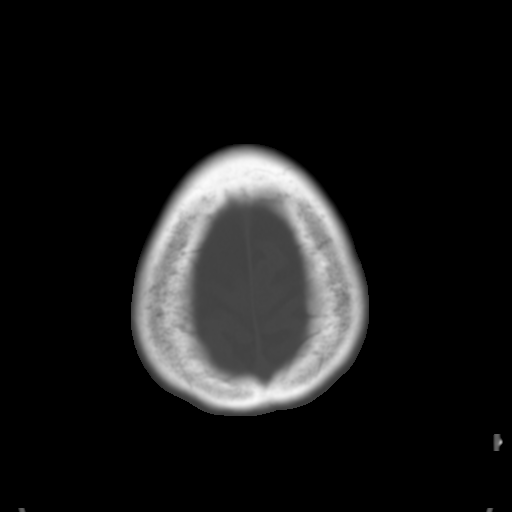
[im 30/33  brain]
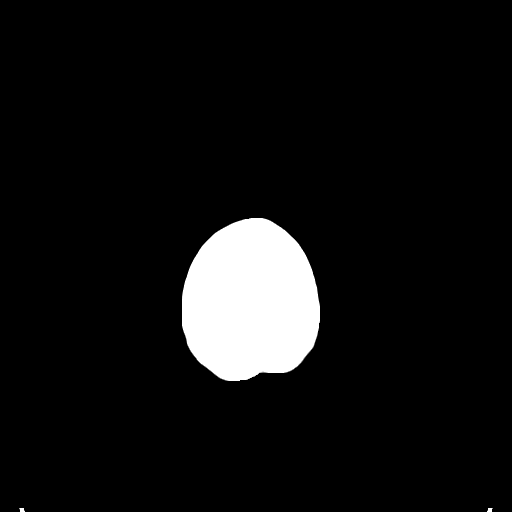

[Series 5: coronal soft · coronal · 0.34mm/px · 3 of 70 slices shown]
[im 24/70  brain]
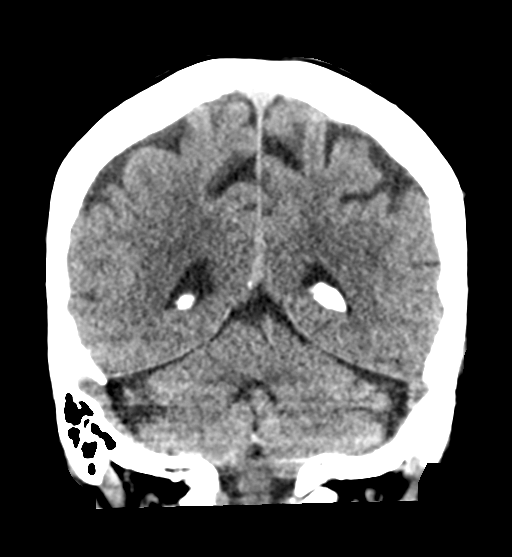
[im 31/70  brain]
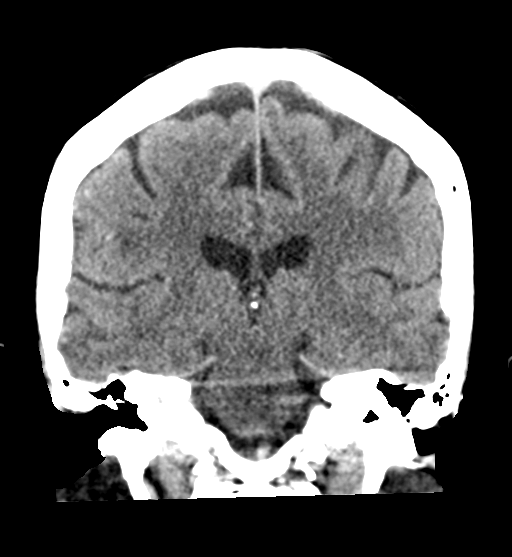
[im 39/70  brain]
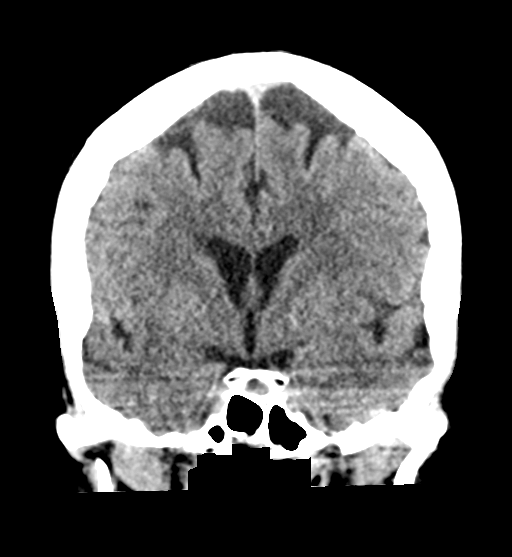

[Series 6: sagittal soft · sagittal · 0.36mm/px · 3 of 67 slices shown]
[im 23/67  brain]
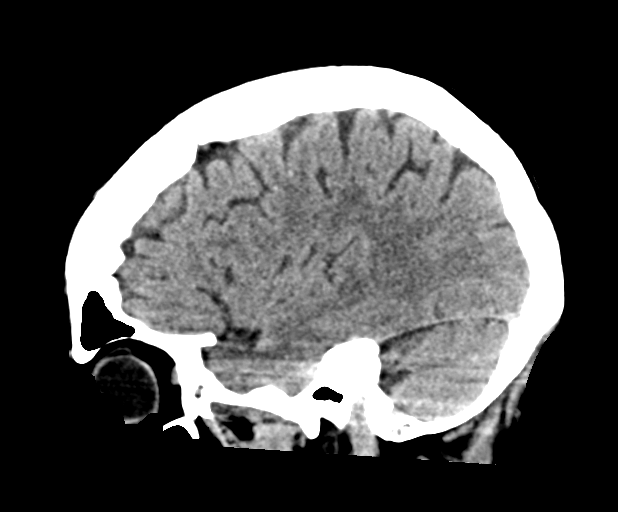
[im 34/67  brain]
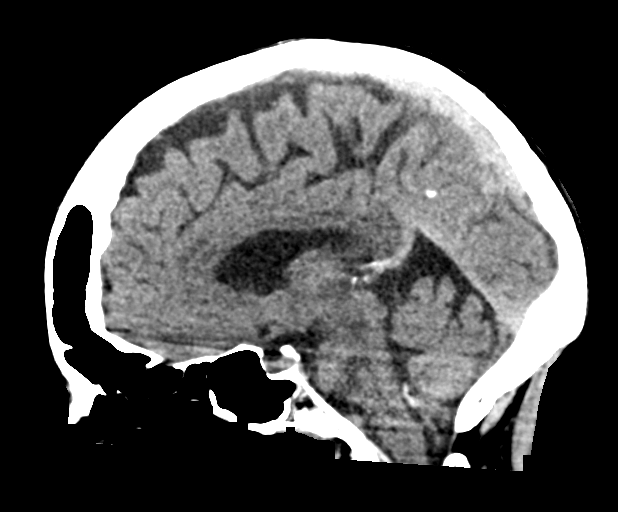
[im 45/67  brain]
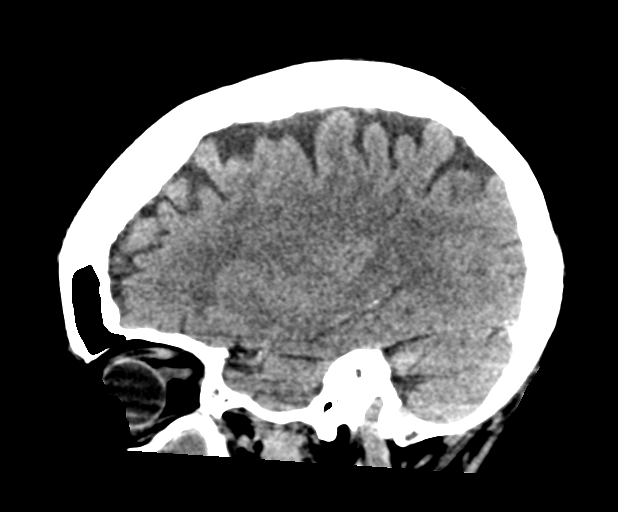

[16 of 47 positions shown; findings below may reference images not displayed]

FINDINGS: Brain: No intracranial hemorrhage, mass effect, or midline shift.
Brain volume is normal for age. No hydrocephalus. The basilar
cisterns are patent. Punctate left basal ganglia mineralization is
likely senescent. No evidence of territorial infarct or acute
ischemia. No extra-axial or intracranial fluid collection.

Vascular: No hyperdense vessel.

Skull: No fracture or focal lesion.

Sinuses/Orbits: Assessed on concurrent face CT, reported separately.

Other: None.
IMPRESSION: No acute intracranial abnormality. No skull fracture.

## 2022-04-18 ENCOUNTER — Encounter (HOSPITAL_COMMUNITY): Payer: Self-pay

## 2022-04-18 ENCOUNTER — Emergency Department (HOSPITAL_COMMUNITY): Payer: Medicare PPO

## 2022-04-18 ENCOUNTER — Other Ambulatory Visit: Payer: Self-pay

## 2022-04-18 ENCOUNTER — Inpatient Hospital Stay (HOSPITAL_COMMUNITY)
Admission: EM | Admit: 2022-04-18 | Discharge: 2022-04-22 | DRG: 308 | Disposition: A | Discharge status: Home / Self Care | Payer: Medicare PPO | Attending: Internal Medicine | Admitting: Internal Medicine

## 2022-04-18 ENCOUNTER — Other Ambulatory Visit (HOSPITAL_COMMUNITY): Payer: Medicare PPO

## 2022-04-18 DIAGNOSIS — I5021 Acute systolic (congestive) heart failure: Secondary | ICD-10-CM | POA: Diagnosis present

## 2022-04-18 DIAGNOSIS — Z823 Family history of stroke: Secondary | ICD-10-CM

## 2022-04-18 DIAGNOSIS — E785 Hyperlipidemia, unspecified: Secondary | ICD-10-CM | POA: Diagnosis present

## 2022-04-18 DIAGNOSIS — Z923 Personal history of irradiation: Secondary | ICD-10-CM

## 2022-04-18 DIAGNOSIS — R7303 Prediabetes: Secondary | ICD-10-CM | POA: Diagnosis present

## 2022-04-18 DIAGNOSIS — Z8542 Personal history of malignant neoplasm of other parts of uterus: Secondary | ICD-10-CM

## 2022-04-18 DIAGNOSIS — Z8249 Family history of ischemic heart disease and other diseases of the circulatory system: Secondary | ICD-10-CM

## 2022-04-18 DIAGNOSIS — Z79899 Other long term (current) drug therapy: Secondary | ICD-10-CM

## 2022-04-18 DIAGNOSIS — I4891 Unspecified atrial fibrillation: Secondary | ICD-10-CM | POA: Diagnosis not present

## 2022-04-18 DIAGNOSIS — K219 Gastro-esophageal reflux disease without esophagitis: Secondary | ICD-10-CM | POA: Diagnosis present

## 2022-04-18 DIAGNOSIS — I110 Hypertensive heart disease with heart failure: Secondary | ICD-10-CM | POA: Diagnosis present

## 2022-04-18 DIAGNOSIS — Z9049 Acquired absence of other specified parts of digestive tract: Secondary | ICD-10-CM

## 2022-04-18 DIAGNOSIS — T502X5A Adverse effect of carbonic-anhydrase inhibitors, benzothiadiazides and other diuretics, initial encounter: Secondary | ICD-10-CM | POA: Diagnosis present

## 2022-04-18 DIAGNOSIS — E873 Alkalosis: Secondary | ICD-10-CM | POA: Diagnosis present

## 2022-04-18 DIAGNOSIS — I10 Essential (primary) hypertension: Secondary | ICD-10-CM | POA: Diagnosis present

## 2022-04-18 DIAGNOSIS — Z90710 Acquired absence of both cervix and uterus: Secondary | ICD-10-CM

## 2022-04-18 DIAGNOSIS — Z7982 Long term (current) use of aspirin: Secondary | ICD-10-CM

## 2022-04-18 DIAGNOSIS — E876 Hypokalemia: Secondary | ICD-10-CM | POA: Diagnosis present

## 2022-04-18 DIAGNOSIS — R000 Tachycardia, unspecified: Secondary | ICD-10-CM | POA: Diagnosis present

## 2022-04-18 DIAGNOSIS — Z955 Presence of coronary angioplasty implant and graft: Secondary | ICD-10-CM

## 2022-04-18 DIAGNOSIS — R8281 Pyuria: Secondary | ICD-10-CM | POA: Diagnosis present

## 2022-04-18 DIAGNOSIS — Z803 Family history of malignant neoplasm of breast: Secondary | ICD-10-CM

## 2022-04-18 DIAGNOSIS — I252 Old myocardial infarction: Secondary | ICD-10-CM

## 2022-04-18 DIAGNOSIS — Y92009 Unspecified place in unspecified non-institutional (private) residence as the place of occurrence of the external cause: Secondary | ICD-10-CM

## 2022-04-18 DIAGNOSIS — R809 Proteinuria, unspecified: Secondary | ICD-10-CM | POA: Diagnosis present

## 2022-04-18 DIAGNOSIS — I2510 Atherosclerotic heart disease of native coronary artery without angina pectoris: Secondary | ICD-10-CM | POA: Diagnosis present

## 2022-04-18 DIAGNOSIS — R262 Difficulty in walking, not elsewhere classified: Secondary | ICD-10-CM | POA: Diagnosis present

## 2022-04-18 DIAGNOSIS — Z833 Family history of diabetes mellitus: Secondary | ICD-10-CM

## 2022-04-18 HISTORY — DX: Atherosclerotic heart disease of native coronary artery without angina pectoris: I25.10

## 2022-04-18 LAB — URINALYSIS, ROUTINE W REFLEX MICROSCOPIC
Glucose, UA: NEGATIVE mg/dL
Hgb urine dipstick: NEGATIVE
Ketones, ur: NEGATIVE mg/dL
Nitrite: NEGATIVE
Protein, ur: 100 mg/dL — AB
Specific Gravity, Urine: 1.026 (ref 1.005–1.030)
pH: 5 (ref 5.0–8.0)

## 2022-04-18 LAB — CBC WITH DIFFERENTIAL/PLATELET
Abs Immature Granulocytes: 0.05 10*3/uL (ref 0.00–0.07)
Basophils Absolute: 0.1 10*3/uL (ref 0.0–0.1)
Basophils Relative: 1 %
Eosinophils Absolute: 0 10*3/uL (ref 0.0–0.5)
Eosinophils Relative: 0 %
HCT: 40.9 % (ref 36.0–46.0)
Hemoglobin: 13.5 g/dL (ref 12.0–15.0)
Immature Granulocytes: 1 %
Lymphocytes Relative: 19 %
Lymphs Abs: 1.8 10*3/uL (ref 0.7–4.0)
MCH: 31 pg (ref 26.0–34.0)
MCHC: 33 g/dL (ref 30.0–36.0)
MCV: 93.8 fL (ref 80.0–100.0)
Monocytes Absolute: 0.8 10*3/uL (ref 0.1–1.0)
Monocytes Relative: 8 %
Neutro Abs: 6.8 10*3/uL (ref 1.7–7.7)
Neutrophils Relative %: 71 %
Platelets: 378 10*3/uL (ref 150–400)
RBC: 4.36 MIL/uL (ref 3.87–5.11)
RDW: 13.1 % (ref 11.5–15.5)
WBC: 9.6 10*3/uL (ref 4.0–10.5)
nRBC: 0 % (ref 0.0–0.2)

## 2022-04-18 LAB — LIPID PANEL
Cholesterol: 80 mg/dL (ref 0–200)
HDL: 36 mg/dL — ABNORMAL LOW (ref >40)
LDL Cholesterol: 21 mg/dL (ref 0–99)
Total CHOL/HDL Ratio: 2.2 RATIO
Triglycerides: 113 mg/dL (ref <150)
VLDL: 23 mg/dL (ref 0–40)

## 2022-04-18 LAB — COMPREHENSIVE METABOLIC PANEL
ALT: 15 U/L (ref 0–44)
AST: 29 U/L (ref 15–41)
Albumin: 3.2 g/dL — ABNORMAL LOW (ref 3.5–5.0)
Alkaline Phosphatase: 106 U/L (ref 38–126)
Anion gap: 10 (ref 5–15)
BUN: 19 mg/dL (ref 8–23)
CO2: 33 mmol/L — ABNORMAL HIGH (ref 22–32)
Calcium: 8.5 mg/dL — ABNORMAL LOW (ref 8.9–10.3)
Chloride: 95 mmol/L — ABNORMAL LOW (ref 98–111)
Creatinine, Ser: 0.94 mg/dL (ref 0.44–1.00)
GFR, Estimated: >60 mL/min (ref >60)
Glucose, Bld: 149 mg/dL — ABNORMAL HIGH (ref 70–99)
Potassium: 3.4 mmol/L — ABNORMAL LOW (ref 3.5–5.1)
Sodium: 138 mmol/L (ref 135–145)
Total Bilirubin: 0.9 mg/dL (ref 0.3–1.2)
Total Protein: 7 g/dL (ref 6.5–8.1)

## 2022-04-18 LAB — HEMOGLOBIN A1C
Hgb A1c MFr Bld: 5.9 % — ABNORMAL HIGH (ref 4.8–5.6)
Mean Plasma Glucose: 122.63 mg/dL

## 2022-04-18 LAB — LIPASE, BLOOD: Lipase: 28 U/L (ref 11–51)

## 2022-04-18 LAB — PHOSPHORUS: Phosphorus: 3.2 mg/dL (ref 2.5–4.6)

## 2022-04-18 LAB — TSH: TSH: 3.122 u[IU]/mL (ref 0.350–4.500)

## 2022-04-18 LAB — TROPONIN I (HIGH SENSITIVITY)
Troponin I (High Sensitivity): 63 ng/L — ABNORMAL HIGH (ref <18)
Troponin I (High Sensitivity): 53 ng/L — ABNORMAL HIGH (ref <18)

## 2022-04-18 LAB — MAGNESIUM: Magnesium: 1.1 mg/dL — ABNORMAL LOW (ref 1.7–2.4)

## 2022-04-18 MED ORDER — SODIUM CHLORIDE 0.9 % IV SOLN
1.0000 g | Freq: Once | INTRAVENOUS | Status: AC
  Administered 2022-04-18: 1 g via INTRAVENOUS
  Filled 2022-04-18: qty 10

## 2022-04-18 MED ORDER — SODIUM CHLORIDE 0.9 % IV SOLN
500.0000 mg | Freq: Once | INTRAVENOUS | Status: DC
  Administered 2022-04-18: 500 mg via INTRAVENOUS
  Filled 2022-04-18: qty 5

## 2022-04-18 MED ORDER — DILTIAZEM HCL-DEXTROSE 125-5 MG/125ML-% IV SOLN (PREMIX)
5.0000 mg/h | INTRAVENOUS | Status: DC
  Filled 2022-04-18: qty 125

## 2022-04-18 MED ORDER — MAGNESIUM SULFATE 2 GM/50ML IV SOLN
2.0000 g | Freq: Once | INTRAVENOUS | Status: AC
  Administered 2022-04-18: 2 g via INTRAVENOUS
  Filled 2022-04-18: qty 50

## 2022-04-18 MED ORDER — POLYETHYLENE GLYCOL 3350 17 G PO PACK: 17.0000 g | PACK | Freq: Every day | ORAL | Status: DC | PRN

## 2022-04-18 MED ORDER — DILTIAZEM LOAD VIA INFUSION
20.0000 mg | Freq: Once | INTRAVENOUS | Status: DC
  Filled 2022-04-18: qty 20

## 2022-04-18 MED ORDER — ACETAMINOPHEN 325 MG PO TABS: 650.0000 mg | ORAL_TABLET | Freq: Four times a day (QID) | ORAL | Status: DC | PRN

## 2022-04-18 MED ORDER — METOPROLOL TARTRATE 25 MG PO TABS
25.0000 mg | ORAL_TABLET | Freq: Four times a day (QID) | ORAL | Status: DC
  Administered 2022-04-18 – 2022-04-20 (×8): 25 mg via ORAL
  Filled 2022-04-18 (×8): qty 1

## 2022-04-18 MED ORDER — POTASSIUM CHLORIDE CRYS ER 20 MEQ PO TBCR
40.0000 meq | EXTENDED_RELEASE_TABLET | Freq: Once | ORAL | Status: AC
  Administered 2022-04-18: 40 meq via ORAL
  Filled 2022-04-18: qty 2

## 2022-04-18 MED ORDER — APIXABAN 5 MG PO TABS
5.0000 mg | ORAL_TABLET | Freq: Two times a day (BID) | ORAL | Status: DC
  Administered 2022-04-18 – 2022-04-22 (×9): 5 mg via ORAL
  Filled 2022-04-18 (×9): qty 1

## 2022-04-18 MED ORDER — ACETAMINOPHEN 650 MG RE SUPP: 650.0000 mg | Freq: Four times a day (QID) | RECTAL | Status: DC | PRN

## 2022-04-18 MED ORDER — ROSUVASTATIN CALCIUM 5 MG PO TABS
10.0000 mg | ORAL_TABLET | Freq: Every day | ORAL | Status: DC
  Administered 2022-04-18 – 2022-04-21 (×4): 10 mg via ORAL
  Filled 2022-04-18 (×4): qty 2

## 2022-04-18 NOTE — Progress Notes (Signed)
Heart rate too high for accurate echo at this time. 

## 2022-04-18 NOTE — ED Provider Triage Note (Signed)
Emergency Medicine Provider Triage Evaluation Note  Gina Duncan. Broecker , a 81 y.o. female  was evaluated in triage.  Pt complains of not feeling well for the past week including weakness, lack of appetite.  She was evaluated at urgent care and found to be in A-fib.  No prior history of A-fib.  Does have history of CAD status post stent.  Not on anticoagulation.  She denies chest pain, shortness of breath.  Review of Systems  Positive: As above Negative: As above  Physical Exam  BP (!) 131/92 (BP Location: Right Arm)   Pulse (!) 127   Temp 97.8 F (36.6 C) (Oral)   Resp 18   Ht '5\' 5"'$  (1.651 m)   Wt 60.8 kg   SpO2 97%   BMI 22.30 kg/m  Gen:   Awake, no distress   Resp:  Normal effort  MSK:   Moves extremities without difficulty  Other:   Medical Decision Making  Medically screening exam initiated at 12:23 PM.  Appropriate orders placed.  Farrel Gordon. Bowermaster was informed that the remainder of the evaluation will be completed by another provider, this initial triage assessment does not replace that evaluation, and the importance of remaining in the ED until their evaluation is complete.     Evlyn Courier, PA-C 04/18/22 1224

## 2022-04-18 NOTE — Plan of Care (Signed)
  Problem: Clinical Measurements: Goal: Diagnostic test results will improve Outcome: Progressing Goal: Respiratory complications will improve Outcome: Progressing   Problem: Activity: Goal: Risk for activity intolerance will decrease Outcome: Progressing   Problem: Safety: Goal: Ability to remain free from injury will improve Outcome: Progressing   

## 2022-04-18 NOTE — Consult Note (Addendum)
Cardiology Consultation:   Patient ID: Gina Gala T. Chestine Spore; [id number]; 06/12/1941   Admit date: 04/18/2022 Date of Consult: 04/18/2022  Primary Care Provider: Pcp, No Primary Cardiologist: None (Was at [facility].  Physician moved.) Primary Electrophysiologist:  None   Patient Profile:   Gina Gala T. Muzio is a 81 y.o. female with a hx of CAD who is being seen today for the evaluation of atrial fib at the request of Dr. Vanita Panda.  History of Present Illness:   Ms. 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   Her care has been at [facility].  She said she been feeling well up until recently having a dental infection.  She was treated with Flagyl and amoxicillin.  She has been feeling poorly.  She has been weak.  She started having some problems with her balance.  Has had some decreased appetite.  However, she did not notice any palpitations.  She was not having any presyncope or syncope.  She was not having any shortness of breath, PND or orthopnea.  She is not having any chest pressure, neck or arm discomfort.  Prior to this recent dental infection she has been feeling okay and was very active in her yard and with chores.  She had no symptoms related to this.  She does wear a FitBit but we cannot retrieve any heart rates from it.  We checked her phone and the device.  She went to an urgent care today because of her dental problem and symptoms and was noted to be in fibrillation.  She had atrial fibrillation with a rapid rate.In the ED she was found to have a magnesium level of 1.1.    Trop was elevated at 63.  Initial cardiac enzymes have been negative.  There has been some subtle ST changes on the EKG as described below   Past Medical History:  Diagnosis Date   CAD (coronary artery disease)    DES to LAD 2017   Endometrial cancer ([place])    Hypertension    Radiation 06/01/15, 06/12/15, 06/15/15, 06/19/15, 06/21/15   proximal vagina 30 gray    Past Surgical  History:  Procedure Laterality Date   CHOLECYSTECTOMY     ROBOTIC ASSISTED TOTAL HYSTERECTOMY WITH BILATERAL SALPINGO OOPHERECTOMY  02/13/2015   by Dr. Polly Cobia     Home Medications:  Prior to Admission medications   Medication Sig Start Date End Date Taking? Authorizing Provider  acetaminophen (TYLENOL) 325 MG tablet Take 650 mg by mouth.    [provider]  amoxicillin (AMOXIL) 875 MG tablet Take 875 mg by mouth 2 (two) times daily. 04/07/22   [provider]  aspirin 81 MG EC tablet Take 81 mg by mouth.    [provider]  metroNIDAZOLE (FLAGYL) 500 MG tablet Take 500 mg by mouth 3 (three) times daily. 04/07/22   [provider]  omeprazole (PRILOSEC) 20 MG capsule Take 20 mg by mouth.    [provider]  propranolol ER (INDERAL LA) 80 MG 24 hr capsule Take 80 mg by mouth.    [provider]  triamterene-hydrochlorothiazide (DYAZIDE) 37.5-25 MG per capsule Take 1 capsule by mouth.    [provider]    Inpatient Medications: Scheduled Meds:  diltiazem  20 mg Intravenous Once   Continuous Infusions:  azithromycin     cefTRIAXone (ROCEPHIN)  IV     diltiazem (CARDIZEM) infusion     magnesium sulfate bolus IVPB  PRN Meds:   Allergies:   No Known Allergies  Social History:   Social History   Socioeconomic History   Marital status: Married    Spouse name: Not on file   Number of children: 2   Years of education: Not on file   Highest education level: Not on file  Occupational History   Not on file  Tobacco Use   Smoking status: Never   Smokeless tobacco: Never  Substance and Sexual Activity   Alcohol use: No    Alcohol/week: 0.0 standard drinks of alcohol   Drug use: No   Sexual activity: Not on file  Other Topics Concern   Not on file  Social History Narrative   Married, 2 children and 8 grand and 5 great   Social Determinants of Health   Financial Resource Strain: Not on file  Food Insecurity:  Not on file  Transportation Needs: Not on file  Physical Activity: Not on file  Stress: Not on file  Social Connections: Not on file  Intimate Partner Violence: Not on file    Family History:   \ Family History  Problem Relation Age of Onset   Diabetes Mother    Peripheral vascular disease Mother    Stroke Father    Breast cancer Daughter      ROS:  Please see the history of present illness.  All other ROS reviewed and negative.     Physical Exam/Data:   Vitals:   04/18/22 1214 04/18/22 1327  BP: (!) 131/92 113/89  Pulse: (!) 127 (!) 129  Resp: 18 16  Temp: 97.8 F (36.6 C)   TempSrc: Oral   SpO2: 97% 99%  Weight: 60.8 kg   Height: '5\' 5"'$  (1.651 m)    No intake or output data in the 24 hours ending 04/18/22 1523 Filed Weights   04/18/22 1214  Weight: 60.8 kg   Body mass index is 22.3 kg/m.  GENERAL:  Wellappearing HEENT:   Pupils equal round and reactive, fundi not visualized, oral mucosa unremarkable NECK:  No  jugular venous distention, waveform within normal limits, carotid upstroke brisk and symmetric, no bruits, no thyromegaly LYMPHATICS:  No cervical, inguinal adenopathy LUNGS:   Clear to auscultation bilaterally BACK:  No CVA tenderness CHEST:   Unremarkable HEART:  PMI not displaced or sustained,S1 and S2 within normal limits, no S3,  no clicks, no rubs, no murmurs, irregular  ABD:  Flat, positive bowel sounds normal in frequency in pitch, no bruits, no rebound, no guarding, no midline pulsatile mass, no hepatomegaly, no splenomegaly EXT:  2 plus pulses throughout, no  edema, no cyanosis no clubbing SKIN:  No rashes no nodules NEURO:   Cranial nerves II through XII grossly intact, motor grossly intact throughout PSYCH:    Cognitively intact, oriented to person place and time   EKG:  The EKG was personally reviewed and demonstrates: Atrial fibrillation, rate 132, axis within normal limits, QT prolonged, diffuse inferolateral ST depression T wave  inversion with mild 1 mm ST elevation in V1 only, no old EKGs for comparison  Telemetry:  Telemetry was personally reviewed and demonstrates: Atrial fibrillation with rapid ventricular rate  Relevant CV Studies: None  Laboratory Data:  Chemistry Recent Labs  Lab 04/18/22 1238  NA 138  K 3.4*  CL 95*  CO2 33*  GLUCOSE 149*  BUN 19  CREATININE 0.94  CALCIUM 8.5*  GFRNONAA >60  ANIONGAP 10    Recent Labs  Lab 04/18/22 1238  PROT 7.0  ALBUMIN 3.2*  AST 29  ALT 15  ALKPHOS 106  BILITOT 0.9   Hematology Recent Labs  Lab 04/18/22 1238  WBC 9.6  RBC 4.36  HGB 13.5  HCT 40.9  MCV 93.8  MCH 31.0  MCHC 33.0  RDW 13.1  PLT 378   Cardiac EnzymesNo results for input(s): "TROPONINI" in the last 168 hours. No results for input(s): "TROPIPOC" in the last 168 hours.  BNPNo results for input(s): "BNP", "PROBNP" in the last 168 hours.  DDimer No results for input(s): "DDIMER" in the last 168 hours.  Radiology/Studies:  DG Chest 2 View  Result Date: 04/18/2022 CLINICAL DATA:  Nausea, vomiting, history of AFib. EXAM: CHEST - 2 VIEW COMPARISON:  None Available. FINDINGS: The cardiomediastinal silhouette is normal. There are hazy opacities in the lingula with silhouetting of the left heart border. There is no other focal consolidation. There is no pulmonary edema. There is no pleural effusion or pneumothorax The bones are diffusely demineralized. There is no acute osseous abnormality. IMPRESSION: Hazy opacities in the lingula with silhouetting of the left heart border could reflect pneumonia in the correct clinical setting. Recommend follow-up radiographs in 3-4 weeks to assess for resolution to exclude solid lesion. Electronically Signed   By: Valetta Mole M.D.   On: 04/18/2022 12:55    Assessment and Plan:   CAD: Patient is not having any anginal symptoms.  We can continue to cycle enzymes.  For right now continue her aspirin no I will discontinue this in the future.  She is  going to be on metoprolol.  I am going to stop the propranolol that she has been on.    ATRIAL FIB: As above, I like to try to use this initially for rate control since she is fairly asymptomatic and I have canceled in order for IV diltiazem.  I think we could quickly titrate the beta-blocker if she has not had adequate rate control over the evening and into the morning.  I am going to start her on Eliquis.  I talked over the risk benefits of anticoagulation with her family. Ms. Coren Crownover. Quale has a CHA2DS2 - VASc score of 3.  We discussed briefly the potential for TEE DC cardioversion if she is not rate controlled.  Likely however she would be cardioverted as an outpatient after adequate anticoagulation.  She is going to decide whether she wants to be seen in [place] as a new patient with [place] or I have invited her to [place] if she wants.  She lives in [place] which is inconvenient to [place] however.  ELECTROLYTES: She has been managed with magnesium supplementation.  We will also supplement.  I will also give her a dose of potassium.  PROLONGED QT: Follow-up EKG in the a.m.   For questions or updates, please contact [place] Please consult [email or web address] for contact info under Cardiology/STEMI.   Signed, Minus Breeding, MD  04/18/2022 3:23 PM

## 2022-04-18 NOTE — H&P (Cosign Needed Addendum)
Date: 04/18/2022               Patient Name:  Gina Duncan. Obi MRN: [id number]  DOB: March 27, 1941 Age / Sex: 81 y.o., female   PCP: Pcp, No         Medical Service: Internal Medicine Teaching Service         Attending Physician: Dr. Heber Nelson, Rachel Moulds, DO    First Contact: Angelique Blonder, DO      Pager: MZ [phone]      Second Contact: Sanjuana Letters, DO     Pager: Maryjean Morn [phone]        After Hours (After 5p/  First Contact Pager: [phone]  weekends / holidays): Second Contact Pager: [phone]   SUBJECTIVE   Chief Complaint: weakness, nausea, vomiting  History of Present Illness:   [name] is a 81 y/o person living with a history of CAD w/ MI of LAD rotablator and DES 2017, HTN, GERD, uterine cancer that presented with worsening weakness, nausea and vomiting for past 1 week. History of recent dental infection of left lower jaw. She was started on amoxicillin and flagyl on 8/7 and completed a 7 day course. She continued to have fatigue and weakness. Nauseated with poor appetite. She notes that even looking at food makes her nauseous. She reports one episode of vomiting that was brown in appearance. Denies fevers or chills. Denies chest pain, shortness of breath, cough, lightheadedness or dizziness. Denies feeling heart beating out of chest. Denies abdominal pain or diarrhea, but is hungry since she has not eaten since early this morning. Prior to the dental infection, she was ambulating well and able to do her daily chores. Now she requires assistance which prompted her husband to take her to the ED for evaluation.   She reports difficulty walking around due to feeling unbalanced. Needed assistance from her husband with moving around. Prior to the infection and antibiotics, she was steady and had no issues with gait.  She does not have a PCP, but she is trying to establish care at [facility] in [place]. She used to see a cardiologist in [place] but they left and would like to  establish care somewhere close to [facility].   ED Course:  Patient was tachycardic but otherwise stable. EKG showed atrial fibrillation with RVR. Labs significant for hypokalemia and hypomagnesemia. CBC unremarkable. Troponin 63 to 53. UA has leukocytes, proteinuria and rare bacteria. CXR showed hazy opacities on left concern for pneumonia. IV antibiotics were started.   Meds:  Crestor 10 mg Propranolol Aspirin 81 mg Omeprazole 20 mg daily Triameterne-HCTZ 37.5-25 mg daily  Past Medical History GERD Hypertension Uterine Cancer CAD w/ MI of LAD rotablator and DES 2017  Past Surgical History:  Procedure Laterality Date   CHOLECYSTECTOMY     ROBOTIC ASSISTED TOTAL HYSTERECTOMY WITH BILATERAL SALPINGO OOPHERECTOMY  02/13/2015   by Dr. Polly Cobia   Social:  Lives With: Husband lives in [place] Occupation: Retired  Support: Family in the area Level of Function: Normally able to perform ADL/IADL, but has been unable to over the past week PCP: New PCP in [place], her primary cardiology Dr. Hamilton Capri recently moved Substances: Denies tobacco, alcohol, or other substance use  Family History:  Mother with diabetes Has 8 brothers and sisters and is unaware of their health history Son - Back problems Daughter - Breast cancer  Allergies: Allergies as of 04/18/2022   (No Known Allergies)   Review of Systems: A complete ROS was negative except as  per HPI.   OBJECTIVE:   Physical Exam: Blood pressure (!) 140/103, pulse (!) 133, temperature 97.8 F (36.6 C), temperature source Oral, resp. rate 20, height '5\' 5"'$  (1.651 m), weight 60.8 kg, SpO2 96 %.  Constitutional: alert, laying in bed, in no acute distress HENT: normocephalic atraumatic, mucous membranes moist, no dental caries or gingival inflammation Neck: supple Cardiovascular: irregular rhythm, tachycardia Pulmonary/Chest: normal work of breathing on room air, lungs clear to ausculation bilaterally Abdominal: soft,  tenderness at epigastric area, non-distended, no guarding MSK: normal bulk and tone Extremities: radial pulse 2+ bilaterally, dorsalis pedis pulse 2+ bilaterally, no LE edema Neurological: alert & oriented x 3 Skin: warm and dry Psych: pleasant, normal mood and behavior  Labs: CBC    Component Value Date/Time   WBC 9.6 04/18/2022 1238   RBC 4.36 04/18/2022 1238   HGB 13.5 04/18/2022 1238   HCT 40.9 04/18/2022 1238   PLT 378 04/18/2022 1238   MCV 93.8 04/18/2022 1238   MCH 31.0 04/18/2022 1238   MCHC 33.0 04/18/2022 1238   RDW 13.1 04/18/2022 1238   LYMPHSABS 1.8 04/18/2022 1238   MONOABS 0.8 04/18/2022 1238   EOSABS 0.0 04/18/2022 1238   BASOSABS 0.1 04/18/2022 1238     CMP     Component Value Date/Time   NA 138 04/18/2022 1238   K 3.4 (L) 04/18/2022 1238   CL 95 (L) 04/18/2022 1238   CO2 33 (H) 04/18/2022 1238   GLUCOSE 149 (H) 04/18/2022 1238   BUN 19 04/18/2022 1238   CREATININE 0.94 04/18/2022 1238   CALCIUM 8.5 (L) 04/18/2022 1238   PROT 7.0 04/18/2022 1238   ALBUMIN 3.2 (L) 04/18/2022 1238   AST 29 04/18/2022 1238   ALT 15 04/18/2022 1238   ALKPHOS 106 04/18/2022 1238   BILITOT 0.9 04/18/2022 1238   GFRNONAA >60 04/18/2022 1238    Imaging: CXR IMPRESSION: Hazy opacities in the lingula with silhouetting of the left heart border could reflect pneumonia in the correct clinical setting. Recommend follow-up radiographs in 3-4 weeks to assess for resolution to exclude solid lesion.   EKG: personally reviewed my interpretation is: atrial fibrillation, rate of 132, normal axis. No prior EKG available for comparison.   ASSESSMENT & PLAN:   Assessment & Plan by Problem: Principal Problem:   Atrial fibrillation ([place])   Farrel Gordon. Spizzirri is a 81 y.o. person living with a history of CAD w/ MI of LAD rotablator and DES 2017, HTN, GERD, uterine cancer who presented with weakness, nausea and vomiting and admitted for atrial fibrillation on hospital day 0  Atrial  Fibrillation Patient presents with persistent weakness, nausea and vomiting since recent dental infection, treated with amoxicillin and flagyl for 7 days. Cardiology consulted and started her on metoprolol for rate control and discussed potential for TEE DC cardioversion if not rate controlled. Troponin 63 to 53. A1c 5.9. TSH normal. A-fib could be from recent infection or electrolyte disturbances due to poor nutrition. CHA2DS2VASc score of 5 which is moderate-high stroke risk. Will continue workup for other etiologies.  -appreciate cardiology assistance in her care -pending Echo -continue metoprolol 25 mg 4 times daily -continue eliquis 5 mg twice daily  Concern for pneumonia CXR showed hazy opacities concern for pneumonia. Patient denies cough, fever, chills, or dyspnea. Lung exam was clear to ausculation bilaterally. Unlikely to have pneumonia. Stopped IV antibiotics that was given in ED. Radiology recommend repeat CXR in 3-4 weeks to assess for resolution and exclude solid lesion.   Electrolytes  Abnormalities  Magnesium 1.1, Potassium 3.4. Phosphorus 3.2. Replenished magnesium and potassium at ED. Will repeat labs.  -repeat BMP, magnesium  HTN BP in goal range. She takes propanolol and Triameterne-HCTZ at home. Will hold triameterne-HCTZ due to hypokalemia. She was started on metoprolol per cardiology for rate control of A-fib.  -continue metoprolol  Pyuria Proteinuria  UA at ED showed proteinuria, leukocytes and rare bacteria. Patient denies urinary symptoms. Will continue to monitor.  HLD She is on Crestor 10 mg at home. Pending lipid panel.    Prediabetes A1c today was 5.9. Will need outpatient follow up.   Diet: Normal VTE: DOAC IVF: None,None Code: Full  Prior to Admission Living Arrangement: Home, living husband Anticipated Discharge Location: Home Barriers to Discharge: further evaluation and management of atrial fibrillation  Dispo: Admit patient to Observation with  expected length of stay less than 2 midnights.  Signed: Angelique Blonder, DO Internal Medicine Resident PGY-1  04/18/2022, 6:29 PM

## 2022-04-18 NOTE — ED Triage Notes (Signed)
Recently given antbiotics for dental infection but has been n/v x 1 week so went to UC and they noticed she has afib with no hx.  Patient complains of n/v and weakness.

## 2022-04-18 NOTE — ED Provider Notes (Signed)
[facility] EMERGENCY DEPARTMENT Provider Note   CSN: [id number] Arrival date & time: 04/18/22  1151     History  Chief Complaint  Patient presents with   Atrial Fibrillation    Gina Duncan is a 81 y.o. female.  HPI Patient presents with family members who assist with the history.  She notes that she is felt weak for about 4 days, went to her physician today and was sent here due to concern for A-fib.  Prior to the onset she did complete a course of antibiotics for dental infection, for which she has recovered.  No chest pain, no dyspnea, there is generalized fatigue and palpitations, however.  No relief with anything.    Home Medications Prior to Admission medications   Medication Sig Start Date End Date Taking? Authorizing Provider  acetaminophen (TYLENOL) 325 MG tablet Take 650 mg by mouth.    [provider]  amoxicillin (AMOXIL) 875 MG tablet Take 875 mg by mouth 2 (two) times daily. 04/07/22   [provider]  aspirin 81 MG EC tablet Take 81 mg by mouth.    [provider]  metroNIDAZOLE (FLAGYL) 500 MG tablet Take 500 mg by mouth 3 (three) times daily. 04/07/22   [provider]  omeprazole (PRILOSEC) 20 MG capsule Take 20 mg by mouth.    [provider]  propranolol ER (INDERAL LA) 80 MG 24 hr capsule Take 80 mg by mouth.    [provider]  triamterene-hydrochlorothiazide (DYAZIDE) 37.5-25 MG per capsule Take 1 capsule by mouth.    [provider]      Allergies    Patient has no known allergies.    Review of Systems   Review of Systems  All other systems reviewed and are negative.   Physical Exam Updated Vital Signs BP (!) 140/103   Pulse (!) 133   Temp 97.8 F (36.6 C) (Oral)   Resp 20   Ht '5\' 5"'$  (1.651 m)   Wt 60.8 kg   SpO2 96%   BMI 22.30 kg/m  Physical Exam Vitals and nursing note reviewed.  Constitutional:      General: She is not in acute distress.     Appearance: She is well-developed.  HENT:     Head: Normocephalic and atraumatic.  Eyes:     Conjunctiva/sclera: Conjunctivae normal.  Cardiovascular:     Rate and Rhythm: Tachycardia present. Rhythm irregular.  Pulmonary:     Effort: Pulmonary effort is normal. No respiratory distress.     Breath sounds: Normal breath sounds. No stridor.  Abdominal:     General: There is no distension.  Skin:    General: Skin is warm and dry.  Neurological:     Mental Status: She is alert and oriented to person, place, and time.     Cranial Nerves: No cranial nerve deficit.  Psychiatric:        Mood and Affect: Mood normal.     ED Results / Procedures / Treatments   Labs (all labs ordered are listed, but only abnormal results are displayed) Labs Reviewed  COMPREHENSIVE METABOLIC PANEL - Abnormal; Notable for the following components:      Result Value   Potassium 3.4 (*)    Chloride 95 (*)    CO2 33 (*)    Glucose, Bld 149 (*)    Calcium 8.5 (*)    Albumin 3.2 (*)    All other components within normal limits  URINALYSIS, ROUTINE W REFLEX  MICROSCOPIC - Abnormal; Notable for the following components:   Color, Urine AMBER (*)    APPearance HAZY (*)    Bilirubin Urine SMALL (*)    Protein, ur 100 (*)    Leukocytes,Ua SMALL (*)    Bacteria, UA RARE (*)    All other components within normal limits  MAGNESIUM - Abnormal; Notable for the following components:   Magnesium 1.1 (*)    All other components within normal limits  TROPONIN I (HIGH SENSITIVITY) - Abnormal; Notable for the following components:   Troponin I (High Sensitivity) 63 (*)    All other components within normal limits  CBC WITH DIFFERENTIAL/PLATELET  LIPASE, BLOOD  TROPONIN I (HIGH SENSITIVITY)    EKG None  Radiology DG Chest 2 View  Result Date: 04/18/2022 CLINICAL DATA:  Nausea, vomiting, history of AFib. EXAM: CHEST - 2 VIEW COMPARISON:  None Available. FINDINGS: The cardiomediastinal silhouette is normal.  There are hazy opacities in the lingula with silhouetting of the left heart border. There is no other focal consolidation. There is no pulmonary edema. There is no pleural effusion or pneumothorax The bones are diffusely demineralized. There is no acute osseous abnormality. IMPRESSION: Hazy opacities in the lingula with silhouetting of the left heart border could reflect pneumonia in the correct clinical setting. Recommend follow-up radiographs in 3-4 weeks to assess for resolution to exclude solid lesion. Electronically Signed   By: Valetta Mole M.D.   On: 04/18/2022 12:55    Procedures Procedures    Medications Ordered in ED Medications  magnesium sulfate IVPB 2 g 50 mL (has no administration in time range)  azithromycin (ZITHROMAX) 500 mg in sodium chloride 0.9 % 250 mL IVPB (has no administration in time range)  cefTRIAXone (ROCEPHIN) 1 g in sodium chloride 0.9 % 100 mL IVPB (1 g Intravenous New Bag/Given 04/18/22 1522)  apixaban (ELIQUIS) tablet 5 mg (5 mg Oral Given 04/18/22 1548)  metoprolol tartrate (LOPRESSOR) tablet 25 mg (25 mg Oral Given 04/18/22 1548)  potassium chloride SA (KLOR-CON M) CR tablet 40 mEq (40 mEq Oral Given 04/18/22 1547)    ED Course/ Medical Decision Making/ A&P This patient with a Hx of multiple medical issues, but no history of atrial fibrillation presents to the ED for concern of weakness, this involves an extensive number of treatment options, and is a complaint that carries with it a high risk of complications and morbidity.    The differential diagnosis includes A-fib, pneumonia, bacteremia, sepsis   Social Determinants of Health:  No limits other than age  Additional history obtained:  Additional history and/or information obtained from family members and labs from outside hospital, notable for unremarkable labs, family for history   After the initial evaluation, orders, including: Labs x-ray were initiated.   Patient placed on Cardiac and  Pulse-Oximetry Monitors. The patient was maintained on a cardiac monitor.  The cardiac monitored showed an rhythm of A-fib 120 abnormal The patient was also maintained on pulse oximetry. The readings were typically 99% on room air normal   On repeat evaluation of the patient stayed the same  Lab Tests:  I personally interpreted labs.  The pertinent results include: Unremarkable labs  Imaging Studies ordered:  I independently visualized and interpreted imaging which showed possible pneumonia I agree with the radiologist interpretation  Consultations Obtained:  I requested consultation with the cardiology,  and discussed lab and imaging findings as well as pertinent plan - they recommend: Switch to beta-blocker after initially provided the patient calcium  channel blocker.  Patient will eval by cardiology as a consulting service during this hospitalization  Dispostion / Final MDM:  After consideration of the diagnostic results and the patient's response to treatment, this patient without history of A-fib but with history of stent in the distant past presents with weakness, found to have evidence for A-fib as well as x-ray concerning for possible pneumonia.  Patient also with multiple electrolyte abnormalities requiring supplementation in the ED.  After initiation of antibiotics, electrolytes, rate control agents patient admitted for monitoring, management.  Final Clinical Impression(s) / ED Diagnoses Final diagnoses:  Atrial fibrillation with RVR ([place])  Hypomagnesemia  Hypokalemia    CRITICAL CARE Performed by: Carmin Muskrat Total critical care time: 35 minutes Critical care time was exclusive of separately billable procedures and treating other patients. Critical care was necessary to treat or prevent imminent or life-threatening deterioration. Critical care was time spent personally by me on the following activities: development of treatment plan with patient and/or surrogate as  well as nursing, discussions with consultants, evaluation of patient's response to treatment, examination of patient, obtaining history from patient or surrogate, ordering and performing treatments and interventions, ordering and review of laboratory studies, ordering and review of radiographic studies, pulse oximetry and re-evaluation of patient's condition.    Carmin Muskrat, MD 04/18/22 1556

## 2022-04-19 ENCOUNTER — Observation Stay (HOSPITAL_COMMUNITY): Payer: Medicare PPO

## 2022-04-19 ENCOUNTER — Inpatient Hospital Stay (HOSPITAL_COMMUNITY): Payer: Medicare PPO

## 2022-04-19 DIAGNOSIS — R809 Proteinuria, unspecified: Secondary | ICD-10-CM

## 2022-04-19 DIAGNOSIS — I5021 Acute systolic (congestive) heart failure: Secondary | ICD-10-CM | POA: Diagnosis present

## 2022-04-19 DIAGNOSIS — Y92009 Unspecified place in unspecified non-institutional (private) residence as the place of occurrence of the external cause: Secondary | ICD-10-CM | POA: Diagnosis not present

## 2022-04-19 DIAGNOSIS — Z823 Family history of stroke: Secondary | ICD-10-CM | POA: Diagnosis not present

## 2022-04-19 DIAGNOSIS — R8281 Pyuria: Secondary | ICD-10-CM | POA: Diagnosis present

## 2022-04-19 DIAGNOSIS — R000 Tachycardia, unspecified: Secondary | ICD-10-CM | POA: Diagnosis present

## 2022-04-19 DIAGNOSIS — Z833 Family history of diabetes mellitus: Secondary | ICD-10-CM | POA: Diagnosis not present

## 2022-04-19 DIAGNOSIS — I5022 Chronic systolic (congestive) heart failure: Secondary | ICD-10-CM | POA: Diagnosis not present

## 2022-04-19 DIAGNOSIS — R7303 Prediabetes: Secondary | ICD-10-CM | POA: Diagnosis present

## 2022-04-19 DIAGNOSIS — Z7982 Long term (current) use of aspirin: Secondary | ICD-10-CM | POA: Diagnosis not present

## 2022-04-19 DIAGNOSIS — Z803 Family history of malignant neoplasm of breast: Secondary | ICD-10-CM | POA: Diagnosis not present

## 2022-04-19 DIAGNOSIS — I080 Rheumatic disorders of both mitral and aortic valves: Secondary | ICD-10-CM | POA: Diagnosis not present

## 2022-04-19 DIAGNOSIS — T502X5A Adverse effect of carbonic-anhydrase inhibitors, benzothiadiazides and other diuretics, initial encounter: Secondary | ICD-10-CM | POA: Diagnosis present

## 2022-04-19 DIAGNOSIS — E785 Hyperlipidemia, unspecified: Secondary | ICD-10-CM | POA: Insufficient documentation

## 2022-04-19 DIAGNOSIS — K219 Gastro-esophageal reflux disease without esophagitis: Secondary | ICD-10-CM | POA: Diagnosis present

## 2022-04-19 DIAGNOSIS — Z8542 Personal history of malignant neoplasm of other parts of uterus: Secondary | ICD-10-CM | POA: Diagnosis not present

## 2022-04-19 DIAGNOSIS — I252 Old myocardial infarction: Secondary | ICD-10-CM | POA: Diagnosis not present

## 2022-04-19 DIAGNOSIS — E873 Alkalosis: Secondary | ICD-10-CM | POA: Diagnosis present

## 2022-04-19 DIAGNOSIS — I4891 Unspecified atrial fibrillation: Secondary | ICD-10-CM

## 2022-04-19 DIAGNOSIS — I4819 Other persistent atrial fibrillation: Secondary | ICD-10-CM

## 2022-04-19 DIAGNOSIS — Z8249 Family history of ischemic heart disease and other diseases of the circulatory system: Secondary | ICD-10-CM | POA: Diagnosis not present

## 2022-04-19 DIAGNOSIS — E878 Other disorders of electrolyte and fluid balance, not elsewhere classified: Secondary | ICD-10-CM | POA: Diagnosis not present

## 2022-04-19 DIAGNOSIS — R262 Difficulty in walking, not elsewhere classified: Secondary | ICD-10-CM | POA: Diagnosis present

## 2022-04-19 DIAGNOSIS — I2510 Atherosclerotic heart disease of native coronary artery without angina pectoris: Secondary | ICD-10-CM | POA: Diagnosis present

## 2022-04-19 DIAGNOSIS — I110 Hypertensive heart disease with heart failure: Secondary | ICD-10-CM | POA: Diagnosis present

## 2022-04-19 DIAGNOSIS — I10 Essential (primary) hypertension: Secondary | ICD-10-CM

## 2022-04-19 DIAGNOSIS — I361 Nonrheumatic tricuspid (valve) insufficiency: Secondary | ICD-10-CM | POA: Diagnosis not present

## 2022-04-19 DIAGNOSIS — Z79899 Other long term (current) drug therapy: Secondary | ICD-10-CM | POA: Diagnosis not present

## 2022-04-19 DIAGNOSIS — E876 Hypokalemia: Secondary | ICD-10-CM

## 2022-04-19 DIAGNOSIS — Z90710 Acquired absence of both cervix and uterus: Secondary | ICD-10-CM | POA: Diagnosis not present

## 2022-04-19 LAB — CBC WITH DIFFERENTIAL/PLATELET
Abs Immature Granulocytes: 0.03 10*3/uL (ref 0.00–0.07)
Basophils Absolute: 0.1 10*3/uL (ref 0.0–0.1)
Basophils Relative: 1 %
Eosinophils Absolute: 0.1 10*3/uL (ref 0.0–0.5)
Eosinophils Relative: 1 %
HCT: 37.2 % (ref 36.0–46.0)
Hemoglobin: 12 g/dL (ref 12.0–15.0)
Immature Granulocytes: 0 %
Lymphocytes Relative: 23 %
Lymphs Abs: 1.7 10*3/uL (ref 0.7–4.0)
MCH: 30.2 pg (ref 26.0–34.0)
MCHC: 32.3 g/dL (ref 30.0–36.0)
MCV: 93.7 fL (ref 80.0–100.0)
Monocytes Absolute: 0.7 10*3/uL (ref 0.1–1.0)
Monocytes Relative: 10 %
Neutro Abs: 4.8 10*3/uL (ref 1.7–7.7)
Neutrophils Relative %: 65 %
Platelets: 334 10*3/uL (ref 150–400)
RBC: 3.97 MIL/uL (ref 3.87–5.11)
RDW: 13.3 % (ref 11.5–15.5)
WBC: 7.3 10*3/uL (ref 4.0–10.5)
nRBC: 0 % (ref 0.0–0.2)

## 2022-04-19 LAB — BASIC METABOLIC PANEL
Anion gap: 10 (ref 5–15)
BUN: 18 mg/dL (ref 8–23)
CO2: 33 mmol/L — ABNORMAL HIGH (ref 22–32)
Calcium: 8.2 mg/dL — ABNORMAL LOW (ref 8.9–10.3)
Chloride: 97 mmol/L — ABNORMAL LOW (ref 98–111)
Creatinine, Ser: 0.88 mg/dL (ref 0.44–1.00)
GFR, Estimated: >60 mL/min (ref >60)
Glucose, Bld: 106 mg/dL — ABNORMAL HIGH (ref 70–99)
Potassium: 3.5 mmol/L (ref 3.5–5.1)
Sodium: 140 mmol/L (ref 135–145)

## 2022-04-19 LAB — MAGNESIUM: Magnesium: 1.5 mg/dL — ABNORMAL LOW (ref 1.7–2.4)

## 2022-04-19 LAB — ECHOCARDIOGRAM COMPLETE
Height: 65 in
S' Lateral: 2.9 cm
Weight: 2228.8 oz

## 2022-04-19 MED ORDER — DIGOXIN 125 MCG PO TABS
0.1250 mg | ORAL_TABLET | Freq: Every day | ORAL | Status: DC
  Administered 2022-04-20 – 2022-04-22 (×3): 0.125 mg via ORAL
  Filled 2022-04-19 (×4): qty 1

## 2022-04-19 MED ORDER — PANTOPRAZOLE SODIUM 40 MG PO TBEC: 80.0000 mg | DELAYED_RELEASE_TABLET | Freq: Every day | ORAL | Status: DC | PRN

## 2022-04-19 MED ORDER — DIGOXIN 0.25 MG/ML IJ SOLN
0.2500 mg | Freq: Four times a day (QID) | INTRAMUSCULAR | Status: AC
  Administered 2022-04-19 – 2022-04-20 (×3): 0.25 mg via INTRAVENOUS
  Filled 2022-04-19 (×3): qty 2

## 2022-04-19 MED ORDER — MAGNESIUM SULFATE 2 GM/50ML IV SOLN
2.0000 g | Freq: Once | INTRAVENOUS | Status: AC
Start: 2022-04-19 — End: 2022-04-19
  Administered 2022-04-19: 2 g via INTRAVENOUS
  Filled 2022-04-19: qty 50

## 2022-04-19 MED ORDER — POTASSIUM CHLORIDE CRYS ER 20 MEQ PO TBCR
40.0000 meq | EXTENDED_RELEASE_TABLET | Freq: Once | ORAL | Status: AC
  Administered 2022-04-19: 40 meq via ORAL
  Filled 2022-04-19: qty 2

## 2022-04-19 NOTE — Progress Notes (Signed)
  Echocardiogram 2D Echocardiogram has been performed.  Gina Duncan 04/19/2022, 4:26 PM

## 2022-04-19 NOTE — Progress Notes (Signed)
Progress Note  Patient Name: Gina Duncan. Betker Date of Encounter: 04/19/2022  [facility] Cardiologist: Hochrein  Subjective   81 yo with hx of CAD, atrial fib   HR remains elevated TSH is 3.122 ( 04/18/22)  Occasional night sweats    Inpatient Medications    Scheduled Meds:  apixaban  5 mg Oral BID   metoprolol tartrate  25 mg Oral QID   rosuvastatin  10 mg Oral Daily   Continuous Infusions:  PRN Meds: acetaminophen **OR** acetaminophen, pantoprazole, polyethylene glycol   Vital Signs    Vitals:   04/18/22 1900 04/19/22 0102 04/19/22 0546 04/19/22 0717  BP:  104/69 129/67 123/62  Pulse:  100 (!) 123 (!) 132  Resp:  '18 18 20  '$ Temp:  98 F (36.7 C) 98 F (36.7 C) 97.6 F (36.4 C)  TempSrc:  Oral Oral Oral  SpO2:  95% 96% 94%  Weight: 64.1 kg  63.2 kg   Height: '5\' 5"'$  (1.651 m)       Intake/Output Summary (Last 24 hours) at 04/19/2022 1013 Last data filed at 04/19/2022 0830 Gross per 24 hour  Intake 500.38 ml  Output 400 ml  Net 100.38 ml      04/19/2022    5:46 AM 04/18/2022    7:00 PM 04/18/2022   12:14 PM  Last 3 Weights  Weight (lbs) 139 lb 4.8 oz 141 lb 5 oz 134 lb  Weight (kg) 63.186 kg 64.1 kg 60.782 kg      Telemetry    Rapid AFib,  130s  Personally Reviewed  ECG     - Personally Reviewed  Physical Exam   GEN: Thin, elderly female, no acute distress Neck: No JVD Cardiac: Irregularly irregular, tachycardic Respiratory: Reduced breath sounds and rales/rhonchi on the right side. GI: Soft, nontender, non-distended  MS: No edema; No deformity. Neuro:  Nonfocal  Psych: Normal affect   Labs    High Sensitivity Troponin:   Recent Labs  Lab 04/18/22 1238 04/18/22 1520  TROPONINIHS 63* 53*     Chemistry Recent Labs  Lab 04/18/22 1238 04/19/22 0326  NA 138 140  K 3.4* 3.5  CL 95* 97*  CO2 33* 33*  GLUCOSE 149* 106*  BUN 19 18  CREATININE 0.94 0.88  CALCIUM 8.5* 8.2*  MG 1.1* 1.5*  PROT 7.0  --   ALBUMIN 3.2*  --    AST 29  --   ALT 15  --   ALKPHOS 106  --   BILITOT 0.9  --   GFRNONAA >60 >60  ANIONGAP 10 10    Lipids  Recent Labs  Lab 04/18/22 1716  CHOL 80  TRIG 113  HDL 36*  LDLCALC 21  CHOLHDL 2.2    Hematology Recent Labs  Lab 04/18/22 1238 04/19/22 0326  WBC 9.6 7.3  RBC 4.36 3.97  HGB 13.5 12.0  HCT 40.9 37.2  MCV 93.8 93.7  MCH 31.0 30.2  MCHC 33.0 32.3  RDW 13.1 13.3  PLT 378 334   Thyroid  Recent Labs  Lab 04/18/22 1716  TSH 3.122    BNPNo results for input(s): "BNP", "PROBNP" in the last 168 hours.  DDimer No results for input(s): "DDIMER" in the last 168 hours.   Radiology    DG Chest 2 View  Result Date: 04/18/2022 CLINICAL DATA:  Nausea, vomiting, history of AFib. EXAM: CHEST - 2 VIEW COMPARISON:  None Available. FINDINGS: The cardiomediastinal silhouette is normal. There are hazy opacities in the lingula with  silhouetting of the left heart border. There is no other focal consolidation. There is no pulmonary edema. There is no pleural effusion or pneumothorax The bones are diffusely demineralized. There is no acute osseous abnormality. IMPRESSION: Hazy opacities in the lingula with silhouetting of the left heart border could reflect pneumonia in the correct clinical setting. Recommend follow-up radiographs in 3-4 weeks to assess for resolution to exclude solid lesion. Electronically Signed   By: Valetta Mole M.D.   On: 04/18/2022 12:55    Cardiac Studies     Patient Profile     81 y.o. female admitted with rapid atrial fibrillation.  Assessment & Plan     Atrial fibrillation: Patient remains tachycardic.  She is scheduled for echo today  Add digoxin load today ,  start Digoxin 0.125 mg a day starting tomorrow   She is asymptomatic.  If he continue to have difficulty with rate control, I would favor [facility] / CV while she is here ( she wanted to go home and have done as OP)   Contniue eliquis 5 BID  Add digoxin low dose    2.  Hyperlipidemia :   cont  rosuvastatin         For questions or updates, please contact [place] Please consult [email or web address] for contact info under        Signed, Mertie Moores, MD  04/19/2022, 10:13 AM

## 2022-04-19 NOTE — Progress Notes (Addendum)
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   Overnight Events: none  She reports feeling well this morning. Less weak and fatigue. Denies chest pain, SOB, palpations, nausea, vomiting or abdominal pain. Able to eat some last night.   She is scheduled to see her new PCP, Dr. Humphrey Rolls at [facility], for 9/25 at 8:30am.  Objective:  Vital signs in last 24 hours: Vitals:   04/18/22 1900 04/19/22 0102 04/19/22 0546 04/19/22 0717  BP:  104/69 129/67 123/62  Pulse:  100 (!) 123 (!) 132  Resp:  '18 18 20  '$ Temp:  98 F (36.7 C) 98 F (36.7 C) 97.6 F (36.4 C)  TempSrc:  Oral Oral Oral  SpO2:  95% 96% 94%  Weight: 64.1 kg  63.2 kg   Height: '5\' 5"'$  (1.651 m)      Supplemental O2: Room Air SpO2: 94 %   Physical Exam:  Constitutional: well-appearing, in no acute distress HENT: normocephalic atraumatic Neck: supple Cardiovascular: tachycardia, irregular rhythm Pulmonary/Chest: normal work of breathing on room air, lungs clear to ausculation bilaterally MSK: normal bulk and tone Neurological: alert & oriented x 3 Skin: warm and dry Psych: normal mood and behavior  Filed Weights   04/18/22 1214 04/18/22 1900 04/19/22 0546  Weight: 60.8 kg 64.1 kg 63.2 kg     Intake/Output Summary (Last 24 hours) at 04/19/2022 1100 Last data filed at 04/19/2022 0830 Gross per 24 hour  Intake 500.38 ml  Output 400 ml  Net 100.38 ml   Net IO Since Admission: 100.38 mL [04/19/22 1100]  Pertinent Labs:    Latest Ref Rng & Units 04/19/2022    3:26 AM 04/18/2022   12:38 PM  CBC  WBC 4.0 - 10.5 K/uL 7.3  9.6   Hemoglobin 12.0 - 15.0 g/dL 12.0  13.5   Hematocrit 36.0 - 46.0 % 37.2  40.9   Platelets 150 - 400 K/uL 334  378        Latest Ref Rng & Units 04/19/2022    3:26 AM 04/18/2022    12:38 PM  CMP  Glucose 70 - 99 mg/dL 106  149   BUN 8 - 23 mg/dL 18  19   Creatinine 0.44 - 1.00 mg/dL 0.88  0.94   Sodium 135 - 145 mmol/L 140  138   Potassium 3.5 - 5.1 mmol/L 3.5  3.4   Chloride 98 - 111 mmol/L 97  95   CO2 22 - 32 mmol/L 33  33   Calcium 8.9 - 10.3 mg/dL 8.2  8.5   Total Protein 6.5 - 8.1 g/dL  7.0   Total Bilirubin 0.3 - 1.2 mg/dL  0.9   Alkaline Phos 38 - 126 U/L  106   AST 15 - 41 U/L  29   ALT 0 - 44 U/L  15     Imaging: DG Chest 2 View  Result Date: 04/18/2022 CLINICAL DATA:  Nausea, vomiting, history of AFib. EXAM: CHEST - 2 VIEW COMPARISON:  None Available. FINDINGS: The cardiomediastinal silhouette is normal. There are hazy opacities in the lingula with silhouetting of the left heart border. There is no other focal consolidation. There is no pulmonary edema. There is no pleural effusion or pneumothorax The bones are diffusely demineralized. There is no acute osseous abnormality. IMPRESSION: Hazy  opacities in the lingula with silhouetting of the left heart border could reflect pneumonia in the correct clinical setting. Recommend follow-up radiographs in 3-4 weeks to assess for resolution to exclude solid lesion. Electronically Signed   By: Valetta Mole M.D.   On: 04/18/2022 12:55    Assessment/Plan:   Principal Problem:   Atrial fibrillation ([place]) Active Problems:   Coronary atherosclerosis of native coronary artery   HTN (hypertension)   Hypokalemia   Hypomagnesemia   Patient Summary: Gina Duncan is a 81 y.o. 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    Atrial Fibrillation HR remains elevated this morning. BP in normal range. Patient is asymptomatic. Cardiology added digoxin to help with rate control. Will plan for Echo today. If unable to rate control, cardiology would do cardioversion while she is here in  hospital. Patient stated she would like to go home and have it done outpatient. A-fib could be triggered from recent infection or electrolyte fluctuations due to HCTZ use and poor po intake. Appreciate cardiology assistance in her care.  -continue metoprolol and digoxin -continue eliquis -pending echo   Left lingula infiltrate on CXR Patient is asymptomatic. If she has pneumonia, she did complete recent antibiotics for her dental infection. Will need repeat CXR in 3-4 weeks to check for resolution.   Electrolytes Abnormalities Magnesium low still at 1.5. Potassium low-normal 3.5. Gave once dose of magnesium and potassium today. Phosphorus normal 3.2. Potassium low likely from HCTZ use and recent poor intake.  -repeat magnesium, BMP  HTN BP has been in normal range. She takes propanolol and triamterene-HCTZ at home. Held both, triameterne-HCTZ due to hypokalemia. Cardiology has her on metoprolol for rate control. Given her BP is at goal here, consider stopping triamterene-HCTZ, but will need follow up outpatient with PCP.   HLD LDL 21, with rosuvastatin 10 mg daily.   Pyuria Proteinuria Patient denies urinary symptoms. Will monitor for changes.   Prediabetes A1c 5.9. Can follow up with new PCP.   Diet: Normal IVF: None,None VTE:  Eliquis Code: Full PT/OT recs: Pending, none. Family Update: husband at beside PCP: appointment scheduled with Dr. Humphrey Rolls at [facility] for 9/25 8:30am  Dispo: Anticipated discharge to Home in 1-2 days pending echocardiogram and atrial fibrillation management.   Angelique Blonder, DO Internal Medicine Resident PGY-1 Please contact the on call pager after 5 pm and on weekends at [phone].

## 2022-04-19 NOTE — Progress Notes (Signed)
Echo attempted. Patient HR as high as 169 while in room. Nurse stated medication had been adjust in attempt to lower HR. Will attempt again as schedule permits.

## 2022-04-19 NOTE — Plan of Care (Signed)

## 2022-04-19 NOTE — Evaluation (Signed)
Occupational Therapy Evaluation and Discharge Patient Details Name: Gina Duncan. Gina Duncan MRN: [id number] DOB: 26-Aug-1941 Today's Date: 04/19/2022   History of Present Illness Pt is a 81 year old woman admitted on 04/18/22 with worsening N/V x 1 week with recent dental infection s/p oral antibiotics. + afib and CXR concerning for L PNA. PMH: CAD, MI, HTN, uterine ca.   Clinical Impression   At her baseline, pt is independent in ADLs and IADLs. She has not felt well the past week and her husband has been assisting her as needed. Pt is currently functioning modified independently to independently. She is currently in afib with HR from 125-165 briefly. Educated pt and husband in energy conservation. No OT needs. Encouraged pt to continue walking to the bathroom and completing her ADLs with set up of nursing staff.      Recommendations for follow up therapy are one component of a multi-disciplinary discharge planning process, led by the attending physician.  Recommendations may be updated based on patient status, additional functional criteria and insurance authorization.   Follow Up Recommendations  No OT follow up    Assistance Recommended at Discharge PRN  Patient can return home with the following Assist for transportation    Functional Status Assessment  Patient has had a recent decline in their functional status and demonstrates the ability to make significant improvements in function in a reasonable and predictable amount of time.  Equipment Recommendations  None recommended by OT    Recommendations for Other Services       Precautions / Restrictions Precautions Precaution Comments: watch HR      Mobility Bed Mobility Overal bed mobility: Modified Independent             General bed mobility comments: HOB up    Transfers Overall transfer level: Modified independent Equipment used: None               General transfer comment: slightly slow to rise      Balance  Overall balance assessment: No apparent balance deficits (not formally assessed)                                         ADL either performed or assessed with clinical judgement   ADL Overall ADL's : Independent                                       General ADL Comments: educated in energy conservation strategies     Vision Baseline Vision/History: 0 No visual deficits Ability to See in Adequate Light: 0 Adequate Patient Visual Report: No change from baseline       Perception     Praxis      Pertinent Vitals/Pain Pain Assessment Pain Assessment: No/denies pain     Hand Dominance Right   Extremity/Trunk Assessment Upper Extremity Assessment Upper Extremity Assessment: Overall WFL for tasks assessed   Lower Extremity Assessment Lower Extremity Assessment: Defer to PT evaluation   Cervical / Trunk Assessment Cervical / Trunk Assessment: Normal   Communication Communication Communication: No difficulties   Cognition Arousal/Alertness: Awake/alert Behavior During Therapy: WFL for tasks assessed/performed Overall Cognitive Status: Within Functional Limits for tasks assessed  General Comments       Exercises     Shoulder Instructions      Home Living Family/patient expects to be discharged to:: Private residence Living Arrangements: Spouse/significant other Available Help at Discharge: Family;Available 24 hours/day Type of Home: House Home Access: Stairs to enter [employer] of Steps: 2 Entrance Stairs-Rails: None Home Layout: One level     Bathroom Shower/Tub: Teacher, early years/pre: Standard     Home Equipment: Conservation officer, nature (2 wheels);Cane - single point          Prior Functioning/Environment Prior Level of Function : Independent/Modified Independent             Mobility Comments: has been needing supervision of husband lately  due to weakness when walking ADLs Comments: stands to shower, likes to cook, rarely drives        OT Problem List: Cardiopulmonary status limiting activity      OT Treatment/Interventions:      OT Goals(Current goals can be found in the care plan section)    OT Frequency:      Co-evaluation              AM-PAC OT "6 Clicks" Daily Activity     Outcome Measure Help from another person eating meals?: None Help from another person taking care of personal grooming?: None Help from another person toileting, which includes using toliet, bedpan, or urinal?: None Help from another person bathing (including washing, rinsing, drying)?: None Help from another person to put on and taking off regular upper body clothing?: None Help from another person to put on and taking off regular lower body clothing?: None 6 Click Score: 24   End of Session    Activity Tolerance: Patient tolerated treatment well Patient left: in bed;with call bell/phone within reach;with family/visitor present  OT Visit Diagnosis: Other (comment) (decreased activity tolerance)                Time: 1610-9604 OT Time Calculation (min): 23 min Charges:  OT General Charges $OT Visit: 1 Visit OT Evaluation $OT Eval Low Complexity: 1 Low OT Treatments $Self Care/Home Management : 8-22 mins  Gina Duncan, OTR/L Acute Rehabilitation Services Office: [phone]   Gina Duncan 04/19/2022, 11:00 AM

## 2022-04-19 NOTE — Hospital Course (Addendum)
Atrial Fibrillation Patient presents with persistent weakness, nausea and vomiting since recent dental infection, treated with amoxicillin and flagyl for 7 days. Continued to feel fatigued and weak requiring assistance with ambulation. EKG in ED showed atrial fibrillation with RVR. TSH normal. Cardiac enzymes did not suggest acute ACS. Cardiology consulted and started her on metoprolol and digoxin for rate control. She was placed on diltiazem for better rate control with plan for cardioversion. A-fib could be from recent infection or electrolyte disturbances due to poor nutrition. CHA2DS2VASc score of 5 which is moderate-high stroke risk, she was started on Eliquis.    HFrEF, NYHA I, Stage B Echocardiogram showed EF 40-45% with global hypokinesis. Without regional wall motion abnormalities. Unclear if new baseline or if secondary to arrhythmia. Will need to be reassessed outpatient. RAP under 8 mmg, appears euvolemic during hospital course. Currently on metoprolol, will need outpatient follow up for GDMT.   Left lingula infiltrate CXR showed hazy opacities concern for pneumonia. Patient denies cough, fever, chills, or dyspnea. On exam, no signs of pnuemonia. If she did have pneumonia prior to admission, she was already on amoxicillin and flagyl for her dental infection. Recommend repeat CXR in 3-4 weeks to assure resolution.    Electrolytes Abnormalities  Patient presented with Magnesium 1.1, Potassium 3.4. Phosphorus 3.2. Replenished electrolytes. Hypokalemia likely from HCTZ use and recent poor po intake.    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 She takes propanolol and Triameterne-HCTZ at home. Held both, triameterne-HCTZ due to hypokalemia. Cardiology has her on metoprolol for rate control. Given her BP is at goal, consider stopping triamterene-HCTZ. This can be discussed at her PCP visit.    Pyuria Proteinuria  UA at ED showed proteinuria, leukocytes  and rare bacteria. Patient denies any urinary symptoms.   HLD LDL 21. Rosuvastatin decreased to 5 mg.   Prediabetes A1c 5.9. Can follow up outpatient with PCP.   --INSTRUCTIONS-- You were hospitalized for atrial fibrillation and electrolyte abnormalities. We started you on medications to control your heart rate. You were feeling well and able to walk without feeling unbalanced. Cardioversion was done and ***. You will need to stop your triamterene-HCTZ and propanolol. Please start taking the medications listed below. Thank you for allowing [place] to be part of your care.   We arranged for you to follow up at:  Cardiology: *** PCP: Please contact your PCP for earlier appointment since you were admitted to the hospital.   Please note these changes made to your medications:   Please START taking:  -Metoprolol 25 mg twice daily *** -Digoxin *** -Eliquis 5 mg twice daily ***  Please STOP taking:  -Triamterene-HCTZ -Propanolol  Please make sure to follow up with Dr. Percival Spanish at the [place] office. You will need to have digoxin levels checked after 5 days of taking it.   Please call our clinic if you have any questions or concerns, we may be able to help and keep you from a long and expensive emergency room wait. Our clinic and after hours phone number is [phone], the best time to call is Monday through Friday 9 am to 4 pm but there is always someone available 24/7 if you have an emergency.

## 2022-04-19 NOTE — Evaluation (Signed)
Physical Therapy Evaluation & Discharge Patient Details Name: Gina Duncan. Nucci MRN: [id number] DOB: 04-17-1941 Today's Date: 04/19/2022  History of Present Illness  81 y/o presented to ED on 04/18/22 for worsening weakness, nausea, and vomiting x 1 week with recent dental infection s/p oral antibioitics. CXR with concern for PNA on L. PMH: CAD s/p MI, HTN, hx of uterine cancer  Clinical Impression  Patient admitted with the above. PTA, patient lives with husband and independent. Patient functioning at modI for mobility with mild balance deficits but no overt LOB. HR max 180 during mobility but asymptomatic. No further skilled PT needs identified acutely. No PT follow up recommended at this time. PT will sign off.        Recommendations for follow up therapy are one component of a multi-disciplinary discharge planning process, led by the attending physician.  Recommendations may be updated based on patient status, additional functional criteria and insurance authorization.  Follow Up Recommendations No PT follow up      Assistance Recommended at Discharge PRN  Patient can return home with the following       Equipment Recommendations None recommended by PT  Recommendations for Other Services       Functional Status Assessment Patient has had a recent decline in their functional status and demonstrates the ability to make significant improvements in function in a reasonable and predictable amount of time.     Precautions / Restrictions Precautions Precaution Comments: watch HR Restrictions Weight Bearing Restrictions: No      Mobility  Bed Mobility Overal bed mobility: Modified Independent             General bed mobility comments: HOB up    Transfers Overall transfer level: Modified independent Equipment used: None               General transfer comment: slightly slow to rise    Ambulation/Gait Ambulation/Gait assistance: Modified independent  (Device/Increase time) Gait Distance (Feet): 100 Feet Assistive device:  (grasping therapist's arm at times) Gait Pattern/deviations: Step-through pattern, Decreased stride length, Drifts right/left       General Gait Details: HR up to 180 during mobility. Patient reaching for therapist's arm at times but no overt LOB  Stairs            Wheelchair Mobility    Modified Rankin (Stroke Patients Only)       Balance Overall balance assessment: Mild deficits observed, not formally tested                                           Pertinent Vitals/Pain Pain Assessment Pain Assessment: No/denies pain    Home Living Family/patient expects to be discharged to:: Private residence Living Arrangements: Spouse/significant other Available Help at Discharge: Family;Available 24 hours/day Type of Home: House Home Access: Stairs to enter Entrance Stairs-Rails: None Entrance Stairs-Number of Steps: 2   Home Layout: One level Home Equipment: Conservation officer, nature (2 wheels);Cane - single point      Prior Function Prior Level of Function : Independent/Modified Independent             Mobility Comments: has been needing supervision of husband lately due to weakness when walking ADLs Comments: stands to shower, likes to cook, rarely drives     Hand Dominance   Dominant Hand: Right    Extremity/Trunk Assessment   Upper Extremity Assessment Upper Extremity Assessment:  Defer to OT evaluation    Lower Extremity Assessment Lower Extremity Assessment: Generalized weakness    Cervical / Trunk Assessment Cervical / Trunk Assessment: Normal  Communication   Communication: No difficulties  Cognition Arousal/Alertness: Awake/alert Behavior During Therapy: WFL for tasks assessed/performed Overall Cognitive Status: Within Functional Limits for tasks assessed                                          General Comments      Exercises      Assessment/Plan    PT Assessment Patient does not need any further PT services  PT Problem List         PT Treatment Interventions      PT Goals (Current goals can be found in the Care Plan section)  Acute Rehab PT Goals Patient Stated Goal: to go home PT Goal Formulation: All assessment and education complete, DC therapy    Frequency       Co-evaluation               AM-PAC PT "6 Clicks" Mobility  Outcome Measure Help needed turning from your back to your side while in a flat bed without using bedrails?: None Help needed moving from lying on your back to sitting on the side of a flat bed without using bedrails?: None Help needed moving to and from a bed to a chair (including a wheelchair)?: None Help needed standing up from a chair using your arms (e.g., wheelchair or bedside chair)?: None Help needed to walk in hospital room?: None Help needed climbing 3-5 steps with a railing? : None 6 Click Score: 24    End of Session   Activity Tolerance: Patient tolerated treatment well Patient left: in bed;with call bell/phone within reach;with family/visitor present Nurse Communication: Mobility status PT Visit Diagnosis: Muscle weakness (generalized) (M62.81);Unsteadiness on feet (R26.81)    Time: 2376-2831 PT Time Calculation (min) (ACUTE ONLY): 21 min   Charges:   PT Evaluation $PT Eval Low Complexity: 1 Low          Januel Doolan A. Gilford Rile PT, DPT Acute Rehabilitation Services Office [phone]   Linna Hoff 04/19/2022, 1:14 PM

## 2022-04-19 NOTE — Plan of Care (Signed)
  Problem: Clinical Measurements: Goal: Diagnostic test results will improve Outcome: Progressing Goal: Respiratory complications will improve Outcome: Progressing   Problem: Activity: Goal: Risk for activity intolerance will decrease Outcome: Progressing   

## 2022-04-20 DIAGNOSIS — R918 Other nonspecific abnormal finding of lung field: Secondary | ICD-10-CM

## 2022-04-20 DIAGNOSIS — I4891 Unspecified atrial fibrillation: Secondary | ICD-10-CM | POA: Diagnosis not present

## 2022-04-20 DIAGNOSIS — I110 Hypertensive heart disease with heart failure: Secondary | ICD-10-CM

## 2022-04-20 DIAGNOSIS — I4819 Other persistent atrial fibrillation: Secondary | ICD-10-CM | POA: Diagnosis not present

## 2022-04-20 DIAGNOSIS — E878 Other disorders of electrolyte and fluid balance, not elsewhere classified: Secondary | ICD-10-CM | POA: Diagnosis not present

## 2022-04-20 DIAGNOSIS — I5022 Chronic systolic (congestive) heart failure: Secondary | ICD-10-CM

## 2022-04-20 LAB — BASIC METABOLIC PANEL
Anion gap: 8 (ref 5–15)
BUN: 12 mg/dL (ref 8–23)
CO2: 32 mmol/L (ref 22–32)
Calcium: 8.7 mg/dL — ABNORMAL LOW (ref 8.9–10.3)
Chloride: 97 mmol/L — ABNORMAL LOW (ref 98–111)
Creatinine, Ser: 0.9 mg/dL (ref 0.44–1.00)
GFR, Estimated: >60 mL/min (ref >60)
Glucose, Bld: 105 mg/dL — ABNORMAL HIGH (ref 70–99)
Potassium: 4.8 mmol/L (ref 3.5–5.1)
Sodium: 137 mmol/L (ref 135–145)

## 2022-04-20 LAB — MAGNESIUM: Magnesium: 1.7 mg/dL (ref 1.7–2.4)

## 2022-04-20 MED ORDER — DILTIAZEM HCL-DEXTROSE 125-5 MG/125ML-% IV SOLN (PREMIX)
5.0000 mg/h | INTRAVENOUS | Status: DC
  Administered 2022-04-20 – 2022-04-21 (×2): 5 mg/h via INTRAVENOUS
  Filled 2022-04-20 (×2): qty 125

## 2022-04-20 MED ORDER — MAGNESIUM SULFATE 2 GM/50ML IV SOLN
2.0000 g | Freq: Once | INTRAVENOUS | Status: AC
  Administered 2022-04-20: 2 g via INTRAVENOUS
  Filled 2022-04-20: qty 50

## 2022-04-20 MED ORDER — METOPROLOL TARTRATE 25 MG PO TABS
25.0000 mg | ORAL_TABLET | Freq: Two times a day (BID) | ORAL | Status: DC
Start: 2022-04-20 — End: 2022-04-22
  Administered 2022-04-20 – 2022-04-22 (×4): 25 mg via ORAL
  Filled 2022-04-20 (×4): qty 1

## 2022-04-20 MED ORDER — DILTIAZEM LOAD VIA INFUSION
10.0000 mg | Freq: Once | INTRAVENOUS | Status: AC
  Administered 2022-04-20: 10 mg via INTRAVENOUS
  Filled 2022-04-20: qty 10

## 2022-04-20 MED ORDER — DILTIAZEM LOAD VIA INFUSION: 10.0000 mg | Freq: Once | INTRAVENOUS | Status: DC

## 2022-04-20 NOTE — Care Management (Signed)
  Transition of Care (TOC) Screening Note   Patient Details  Name: Gina Duncan. Naas Date of Birth: [birth year]   Transition of Care [facility]) CM/SW Contact:    Carles Collet, RN Phone Number: 04/20/2022, 3:39 PM    Transition of Care Department [facility]) has reviewed patient and no TOC needs have been identified at this time. We will continue to monitor patient advancement through interdisciplinary progression rounds. If new patient transition needs arise, please place a TOC consult.

## 2022-04-20 NOTE — Progress Notes (Signed)
HD#1 Subjective:   Summary: Gina Duncan is a 81 year old female with a pertinent PMH of CAD w/ MI of LAD rotablator and DES 2017, HTN, GERD, uterine cancer, who presented with weakness, nausea and vomiting and admitted for atrial fibrillation.   Overnight Events: No acute events   Patient resting be comfortably with husband at bedside. Feels well this morning, worked with OT yesterday and did not have difficulty. Persistently elevated HR 140-160's since ambulating to and from bathroom. Denies fluttering, chest pain, shortness of breath. Discussed possible need for cardioversion if no improvement on rate control therapy.   Objective:  Vital signs in last 24 hours: Vitals:   04/20/22 0443 04/20/22 0815 04/20/22 0915 04/20/22 1015  BP: (!) 141/82 130/65    Pulse: 97 65 (!) 57 94  Resp: 20 20    Temp: 97.8 F (36.6 C) 97.7 F (36.5 C)    TempSrc: Oral Oral    SpO2: 95% 95% 95% 96%  Weight: 62.8 kg     Height:       Supplemental O2: Room Air SpO2: 96 %   Physical Exam:  Constitutional: no acute distress HENT: normocephalic atraumatic Eyes: conjunctiva non-erythematous Neck: supple Cardiovascular: tachycardic, no murmurs, gallops, or rubs Pulmonary/Chest: normal work of breathing on room air, lungs clear to auscultation bilaterally MSK: normal bulk and tone Neurological: alert & oriented x 3 Skin: extremities warm and dry Psych: normal mood  Filed Weights   04/18/22 1900 04/19/22 0546 04/20/22 0443  Weight: 64.1 kg 63.2 kg 62.8 kg     Intake/Output Summary (Last 24 hours) at 04/20/2022 1109 Last data filed at 04/20/2022 0900 Gross per 24 hour  Intake 581.81 ml  Output 600 ml  Net -18.19 ml   Net IO Since Admission: 82.19 mL [04/20/22 1109]  Pertinent Labs:    Latest Ref Rng & Units 04/19/2022    3:26 AM 04/18/2022   12:38 PM  CBC  WBC 4.0 - 10.5 K/uL 7.3  9.6   Hemoglobin 12.0 - 15.0 g/dL 12.0  13.5   Hematocrit 36.0 - 46.0 % 37.2  40.9   Platelets 150 -  400 K/uL 334  378        Latest Ref Rng & Units 04/20/2022    4:46 AM 04/19/2022    3:26 AM 04/18/2022   12:38 PM  CMP  Glucose 70 - 99 mg/dL 105  106  149   BUN 8 - 23 mg/dL '12  18  19   '$ Creatinine 0.44 - 1.00 mg/dL 0.90  0.88  0.94   Sodium 135 - 145 mmol/L 137  140  138   Potassium 3.5 - 5.1 mmol/L 4.8  3.5  3.4   Chloride 98 - 111 mmol/L 97  97  95   CO2 22 - 32 mmol/L 32  33  33   Calcium 8.9 - 10.3 mg/dL 8.7  8.2  8.5   Total Protein 6.5 - 8.1 g/dL   7.0   Total Bilirubin 0.3 - 1.2 mg/dL   0.9   Alkaline Phos 38 - 126 U/L   106   AST 15 - 41 U/L   29   ALT 0 - 44 U/L   15     Imaging: ECHOCARDIOGRAM COMPLETE  Result Date: 04/19/2022    ECHOCARDIOGRAM REPORT   Patient Name:   Gina Duncan Date of Exam: 04/19/2022 Medical Rec #:  [id number]         Height:  65.0 in Accession #:    [id number]        Weight:       139.3 lb Date of Birth:  1941-08-25         BSA:          1.696 m Patient Age:    27 years          BP:           123/62 mmHg Patient Gender: F                 HR:           107 bpm. Exam Location:  Inpatient Procedure: 2D Echo, Color Doppler and Cardiac Doppler Indications:     Atrial Fibrillation I48.91  History:         Patient has no prior history of Echocardiogram examinations.                  CAD; Risk Factors:Hypertension. Cancer, hx of radiation                  therapy.  Sonographer:     Eartha Inch Referring Phys:  [place] Diagnosing Phys: Oswaldo Milian MD  Sonographer Comments: Uncontrolled HR. IMPRESSIONS  1. Technically difficult study due to poor windows and in Afib with RVR. Left ventricular ejection fraction, by estimation, is 40 to 45%. The left ventricle has mildly decreased function. The left ventricle demonstrates global hypokinesis. There is mild  left ventricular hypertrophy. Left ventricular diastolic parameters are indeterminate.  2. Right ventricular systolic function is mildly reduced. The right ventricular size is normal.  There is moderately elevated pulmonary artery systolic pressure. The estimated right ventricular systolic pressure is 82.9 mmHg.  3. Left atrial size was mildly dilated.  4. The mitral valve is degenerative. Trivial mitral valve regurgitation. Moderate mitral annular calcification.  5. Tricuspid valve regurgitation is mild to moderate.  6. The aortic valve was not well visualized. Aortic valve regurgitation is not visualized. Aortic valve sclerosis/calcification is present, without any evidence of aortic stenosis.  7. The inferior vena cava is normal in size with greater than 50% respiratory variability, suggesting right atrial pressure of 3 mmHg. FINDINGS  Left Ventricle: Left ventricular ejection fraction, by estimation, is 40 to 45%. The left ventricle has mildly decreased function. The left ventricle demonstrates global hypokinesis. The left ventricular internal cavity size was normal in size. There is  mild left ventricular hypertrophy. Left ventricular diastolic parameters are indeterminate. Right Ventricle: The right ventricular size is normal. Right vetricular wall thickness was not well visualized. Right ventricular systolic function is mildly reduced. There is moderately elevated pulmonary artery systolic pressure. The tricuspid regurgitant velocity is 3.25 m/s, and with an assumed right atrial pressure of 3 mmHg, the estimated right ventricular systolic pressure is 56.2 mmHg. Left Atrium: Left atrial size was mildly dilated. Right Atrium: Right atrial size was normal in size. Pericardium: There is no evidence of pericardial effusion. Mitral Valve: The mitral valve is degenerative in appearance. Moderate mitral annular calcification. Trivial mitral valve regurgitation. Tricuspid Valve: The tricuspid valve is normal in structure. Tricuspid valve regurgitation is mild to moderate. Aortic Valve: The aortic valve was not well visualized. Aortic valve regurgitation is not visualized. Aortic valve  sclerosis/calcification is present, without any evidence of aortic stenosis. Pulmonic Valve: The pulmonic valve was not well visualized. Pulmonic valve regurgitation is not visualized. Aorta: The aortic root is normal in size and structure. Venous: The inferior vena  cava is normal in size with greater than 50% respiratory variability, suggesting right atrial pressure of 3 mmHg. IAS/Shunts: The interatrial septum was not well visualized.  LEFT VENTRICLE PLAX 2D LVIDd:         3.50 cm LVIDs:         2.90 cm LV PW:         0.90 cm LV IVS:        0.90 cm LVOT diam:     1.70 cm LVOT Area:     2.27 cm  LEFT ATRIUM             Index        RIGHT ATRIUM           Index LA diam:        3.50 cm 2.06 cm/m   RA Area:     16.30 cm LA Vol (A2C):   64.7 ml 38.14 ml/m  RA Volume:   46.80 ml  27.59 ml/m LA Vol (A4C):   66.4 ml 39.14 ml/m LA Biplane Vol: 68.1 ml 40.15 ml/m   AORTA Ao Root diam: 3.20 cm TRICUSPID VALVE TR Peak grad:   42.2 mmHg TR Vmax:        325.00 cm/s  SHUNTS Systemic Diam: 1.70 cm Oswaldo Milian MD Electronically signed by Oswaldo Milian MD Signature Date/Time: 04/19/2022/5:01:15 PM    Final (Updated)     Assessment/Plan:   Principal Problem:   Atrial fibrillation ([place]) Active Problems:   Coronary atherosclerosis of native coronary artery   HTN (hypertension)   Hypokalemia   Hypomagnesemia  Patient Summary: Waver Dibiasio is a 81 year old female with a pertinent PMH of CAD w/ MI of LAD rotablator and DES 2017, HTN, GERD, uterine cancer, who presented with weakness, nausea and vomiting and admitted for atrial fibrillation.   Atrial fibrillation Etiology likely electrolyte derangements from diuretic therapy and poor PO intake over last 2 weeks. She has remained asymptomatic. Do not suspect ischemic etiology. On evaluation had just received digoxin and metoprolol and continued to be tachycardic in 140-160. Prior to administering diltiazem drip, HR improved to 90-100's. Will continue  current regimen, if continues to have worsening tachycardia with ambulation and tasks on PO therapies, will likely need DC/CV. Appreciate Dr Acie Fredrickson and RN Madison's in her care.  -continue metoprolol 25 mg QID and digoxin .125 mg daily -pending dig level -if persistent tachycardia, add dilt and consider DC/CV -continue eliquid 5 mg BID  HFrEF NYHA I, Stg B EF 40-45% with global hypokinesis. Without regional wall motion abnormalities. Unclear if new baseline or if secondary to arrhythmia. Will need to be reassessed outpatient. Current GDMT of metoprolol. RAP under 8 mmg, appears euvolemic on exam.   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  -continue eliquis BID and crestor 10 mg daily  Electrolyte abnormalities Mag of 1.7, goal over 2. Will give 2 mg IV. Continue to monitor K.   Left lower lobe mass Will need outpatient xray follow up to assure resolution. Unclear etiology but if did have a pneumonia past two weeks, was receiving amoxicillin and flagyl for her dental infection.   Diet: Normal IVF: None,None VTE: DOAC Code: Full PT/OT recs: Pending PT rec, none from OT  Dispo: Anticipated discharge to Home in 1-2 days.   [place] [facility] Resident PGY-3 Please contact the  on call pager after 5 pm and on weekends at [phone].

## 2022-04-20 NOTE — Progress Notes (Signed)
Progress Note  Patient Name: Gina Duncan. [place] Date of Encounter: 04/20/2022  [facility] Cardiologist: Hochrein  Subjective   81 yo with hx of CAD, atrial fib   HR remains elevated TSH is 3.122 ( 04/18/22)  Occasional night sweats  She was having significant tachycardia this morning.  IV Cardizem was added to her medications.  Inpatient Medications    Scheduled Meds:  apixaban  5 mg Oral BID   digoxin  0.125 mg Oral Daily   metoprolol tartrate  25 mg Oral QID   rosuvastatin  10 mg Oral Daily   Continuous Infusions:  diltiazem (CARDIZEM) infusion 5 mg/hr (04/20/22 1315)   PRN Meds: acetaminophen **OR** acetaminophen, pantoprazole, polyethylene glycol   Vital Signs    Vitals:   04/20/22 1015 04/20/22 1125 04/20/22 1318 04/20/22 1400  BP:  (!) 157/91 (!) 108/54 106/72  Pulse: 94 99    Resp:      Temp:      TempSrc:      SpO2: 96% 93%    Weight:      Height:        Intake/Output Summary (Last 24 hours) at 04/20/2022 1405 Last data filed at 04/20/2022 0900 Gross per 24 hour  Intake 404.81 ml  Output 600 ml  Net -195.19 ml       04/20/2022    4:43 AM 04/19/2022    5:46 AM 04/18/2022    7:00 PM  Last 3 Weights  Weight (lbs) 138 lb 8 oz 139 lb 4.8 oz 141 lb 5 oz  Weight (kg) 62.823 kg 63.186 kg 64.1 kg      Telemetry    Rapid AFib,  130s  Personally Reviewed  ECG     - Personally Reviewed  Physical Exam   GEN: Thin, elderly female, no acute distress Neck: No JVD Cardiac: Irregularly irregular, tachycardic Respiratory: Reduced breath sounds and rales/rhonchi on the right side. GI: Soft, nontender, non-distended  MS: No edema; No deformity. Neuro:  Nonfocal  Psych: Normal affect   Labs    High Sensitivity Troponin:   Recent Labs  Lab 04/18/22 1238 04/18/22 1520  TROPONINIHS 63* 53*      Chemistry Recent Labs  Lab 04/18/22 1238 04/19/22 0326 04/20/22 0446  NA 138 140 137  K 3.4* 3.5 4.8  CL 95* 97* 97*  CO2 33* 33* 32   GLUCOSE 149* 106* 105*  BUN '19 18 12  '$ CREATININE 0.94 0.88 0.90  CALCIUM 8.5* 8.2* 8.7*  MG 1.1* 1.5* 1.7  PROT 7.0  --   --   ALBUMIN 3.2*  --   --   AST 29  --   --   ALT 15  --   --   ALKPHOS 106  --   --   BILITOT 0.9  --   --   GFRNONAA >60 >60 >60  ANIONGAP '10 10 8     '$ Lipids  Recent Labs  Lab 04/18/22 1716  CHOL 80  TRIG 113  HDL 36*  LDLCALC 21  CHOLHDL 2.2     Hematology Recent Labs  Lab 04/18/22 1238 04/19/22 0326  WBC 9.6 7.3  RBC 4.36 3.97  HGB 13.5 12.0  HCT 40.9 37.2  MCV 93.8 93.7  MCH 31.0 30.2  MCHC 33.0 32.3  RDW 13.1 13.3  PLT 378 334    Thyroid  Recent Labs  Lab 04/18/22 1716  TSH 3.122     BNPNo results for input(s): "BNP", "PROBNP" in the last 168 hours.  DDimer No results for input(s): "DDIMER" in the last 168 hours.   Radiology    ECHOCARDIOGRAM COMPLETE  Result Date: 04/19/2022    ECHOCARDIOGRAM REPORT   Patient Name:   Gina Duncan Date of Exam: 04/19/2022 Medical Rec #:  [id number]         Height:       65.0 in Accession #:    [id number]        Weight:       139.3 lb Date of Birth:  [birth year]         BSA:          1.696 m Patient Age:    22 years          BP:           123/62 mmHg Patient Gender: F                 HR:           107 bpm. Exam Location:  Inpatient Procedure: 2D Echo, Color Doppler and Cardiac Doppler Indications:     Atrial Fibrillation I48.91  History:         Patient has no prior history of Echocardiogram examinations.                  CAD; Risk Factors:Hypertension. Cancer, hx of radiation                  therapy.  Sonographer:     Eartha Inch Referring Phys:  Mathews Diagnosing Phys: Oswaldo Milian MD  Sonographer Comments: Uncontrolled HR. IMPRESSIONS  1. Technically difficult study due to poor windows and in Afib with RVR. Left ventricular ejection fraction, by estimation, is 40 to 45%. The left ventricle has mildly decreased function. The left ventricle demonstrates global  hypokinesis. There is mild  left ventricular hypertrophy. Left ventricular diastolic parameters are indeterminate.  2. Right ventricular systolic function is mildly reduced. The right ventricular size is normal. There is moderately elevated pulmonary artery systolic pressure. The estimated right ventricular systolic pressure is 50.0 mmHg.  3. Left atrial size was mildly dilated.  4. The mitral valve is degenerative. Trivial mitral valve regurgitation. Moderate mitral annular calcification.  5. Tricuspid valve regurgitation is mild to moderate.  6. The aortic valve was not well visualized. Aortic valve regurgitation is not visualized. Aortic valve sclerosis/calcification is present, without any evidence of aortic stenosis.  7. The inferior vena cava is normal in size with greater than 50% respiratory variability, suggesting right atrial pressure of 3 mmHg. FINDINGS  Left Ventricle: Left ventricular ejection fraction, by estimation, is 40 to 45%. The left ventricle has mildly decreased function. The left ventricle demonstrates global hypokinesis. The left ventricular internal cavity size was normal in size. There is  mild left ventricular hypertrophy. Left ventricular diastolic parameters are indeterminate. Right Ventricle: The right ventricular size is normal. Right vetricular wall thickness was not well visualized. Right ventricular systolic function is mildly reduced. There is moderately elevated pulmonary artery systolic pressure. The tricuspid regurgitant velocity is 3.25 m/s, and with an assumed right atrial pressure of 3 mmHg, the estimated right ventricular systolic pressure is 93.8 mmHg. Left Atrium: Left atrial size was mildly dilated. Right Atrium: Right atrial size was normal in size. Pericardium: There is no evidence of pericardial effusion. Mitral Valve: The mitral valve is degenerative in appearance. Moderate mitral annular calcification. Trivial mitral valve regurgitation. Tricuspid Valve: The  tricuspid valve is normal in structure. Tricuspid  valve regurgitation is mild to moderate. Aortic Valve: The aortic valve was not well visualized. Aortic valve regurgitation is not visualized. Aortic valve sclerosis/calcification is present, without any evidence of aortic stenosis. Pulmonic Valve: The pulmonic valve was not well visualized. Pulmonic valve regurgitation is not visualized. Aorta: The aortic root is normal in size and structure. Venous: The inferior vena cava is normal in size with greater than 50% respiratory variability, suggesting right atrial pressure of 3 mmHg. IAS/Shunts: The interatrial septum was not well visualized.  LEFT VENTRICLE PLAX 2D LVIDd:         3.50 cm LVIDs:         2.90 cm LV PW:         0.90 cm LV IVS:        0.90 cm LVOT diam:     1.70 cm LVOT Area:     2.27 cm  LEFT ATRIUM             Index        RIGHT ATRIUM           Index LA diam:        3.50 cm 2.06 cm/m   RA Area:     16.30 cm LA Vol (A2C):   64.7 ml 38.14 ml/m  RA Volume:   46.80 ml  27.59 ml/m LA Vol (A4C):   66.4 ml 39.14 ml/m LA Biplane Vol: 68.1 ml 40.15 ml/m   AORTA Ao Root diam: 3.20 cm TRICUSPID VALVE TR Peak grad:   42.2 mmHg TR Vmax:        325.00 cm/s  SHUNTS Systemic Diam: 1.70 cm Oswaldo Milian MD Electronically signed by Oswaldo Milian MD Signature Date/Time: 04/19/2022/5:01:15 PM    Final (Updated)     Cardiac Studies     Patient Profile     81 y.o. female admitted with rapid atrial fibrillation.  Assessment & Plan     Atrial fibrillation: Patient remains tachycardic.  She is scheduled for echo today  Add digoxin load today ,  start Digoxin 0.125 mg a day starting tomorrow   She is asymptomatic.  If he continue to have difficulty with rate control, I would favor [facility] / CV while she is here ( she wanted to go home and have done as OP)   Contniue eliquis 5 BID   Rate is much better controlled on IV Cardizem.  We will reduce the metoprolol to 25 mg p.o. twice daily. We will  have written orders for TEE/cardioversion for tomorrow.  We will hold her n.p.o. tonight in case we can add her on.  Otherwise we will have to schedule for Tuesday.   2.  Hyperlipidemia :   Continue rosuvastatin.        For questions or updates, please contact [place] Please consult [email or web address] for contact info under        Signed, Mertie Moores, MD  04/20/2022, 2:05 PM

## 2022-04-20 NOTE — Progress Notes (Addendum)
0820: Patients heart rate fluctuating in the 130s-160s, ?SVT/Afib RVR, pt ambulated to the bathroom when this occurred.  Scheduled PO dig and metoprolol given as well as IV mag hung. [facility] residents currently at the bedside. No interventions at this time. Will continue to monitor.  0915: Plan was to start patient on Cardizem gtt, however, pt rate/rhythm now 90s-low 100s afib. Spoke to ordering Resident MD and he stated to hold off on cardizem gtt for now, keep an eye on heart rate if spikes again will consider starting gtt.

## 2022-04-21 ENCOUNTER — Encounter (HOSPITAL_COMMUNITY)
Admission: EM | Disposition: A | Discharge status: Home / Self Care | Payer: Self-pay | Source: Home / Self Care | Attending: Internal Medicine

## 2022-04-21 ENCOUNTER — Inpatient Hospital Stay (HOSPITAL_COMMUNITY): Payer: Medicare PPO

## 2022-04-21 ENCOUNTER — Inpatient Hospital Stay (HOSPITAL_COMMUNITY): Payer: Medicare PPO | Admitting: Anesthesiology

## 2022-04-21 ENCOUNTER — Encounter (HOSPITAL_COMMUNITY): Payer: Self-pay | Admitting: Internal Medicine

## 2022-04-21 DIAGNOSIS — I4891 Unspecified atrial fibrillation: Secondary | ICD-10-CM | POA: Diagnosis not present

## 2022-04-21 DIAGNOSIS — E878 Other disorders of electrolyte and fluid balance, not elsewhere classified: Secondary | ICD-10-CM | POA: Diagnosis not present

## 2022-04-21 DIAGNOSIS — I110 Hypertensive heart disease with heart failure: Secondary | ICD-10-CM

## 2022-04-21 DIAGNOSIS — I2510 Atherosclerotic heart disease of native coronary artery without angina pectoris: Secondary | ICD-10-CM | POA: Diagnosis not present

## 2022-04-21 DIAGNOSIS — I5021 Acute systolic (congestive) heart failure: Secondary | ICD-10-CM | POA: Diagnosis not present

## 2022-04-21 DIAGNOSIS — I5022 Chronic systolic (congestive) heart failure: Secondary | ICD-10-CM | POA: Diagnosis not present

## 2022-04-21 DIAGNOSIS — I361 Nonrheumatic tricuspid (valve) insufficiency: Secondary | ICD-10-CM

## 2022-04-21 DIAGNOSIS — E785 Hyperlipidemia, unspecified: Secondary | ICD-10-CM | POA: Diagnosis not present

## 2022-04-21 DIAGNOSIS — I509 Heart failure, unspecified: Secondary | ICD-10-CM

## 2022-04-21 DIAGNOSIS — I080 Rheumatic disorders of both mitral and aortic valves: Secondary | ICD-10-CM

## 2022-04-21 HISTORY — PX: CARDIOVERSION: SHX1299

## 2022-04-21 HISTORY — PX: TEE WITHOUT CARDIOVERSION: SHX5443

## 2022-04-21 LAB — BASIC METABOLIC PANEL
Anion gap: 10 (ref 5–15)
BUN: 11 mg/dL (ref 8–23)
CO2: 32 mmol/L (ref 22–32)
Calcium: 8.9 mg/dL (ref 8.9–10.3)
Chloride: 97 mmol/L — ABNORMAL LOW (ref 98–111)
Creatinine, Ser: 0.87 mg/dL (ref 0.44–1.00)
GFR, Estimated: >60 mL/min (ref >60)
Glucose, Bld: 115 mg/dL — ABNORMAL HIGH (ref 70–99)
Potassium: 4.1 mmol/L (ref 3.5–5.1)
Sodium: 139 mmol/L (ref 135–145)

## 2022-04-21 LAB — DIGOXIN LEVEL: Digoxin Level: 1.1 ng/mL (ref 0.8–2.0)

## 2022-04-21 LAB — MAGNESIUM: Magnesium: 1.5 mg/dL — ABNORMAL LOW (ref 1.7–2.4)

## 2022-04-21 LAB — PROTIME-INR
INR: 1.3 — ABNORMAL HIGH (ref 0.8–1.2)
Prothrombin Time: 16.4 seconds — ABNORMAL HIGH (ref 11.4–15.2)

## 2022-04-21 SURGERY — ECHOCARDIOGRAM, TRANSESOPHAGEAL
Anesthesia: Monitor Anesthesia Care

## 2022-04-21 MED ORDER — PROPOFOL 500 MG/50ML IV EMUL
INTRAVENOUS | Status: DC | PRN
  Administered 2022-04-21: 125 ug/kg/min via INTRAVENOUS

## 2022-04-21 MED ORDER — MAGNESIUM SULFATE 2 GM/50ML IV SOLN
2.0000 g | Freq: Once | INTRAVENOUS | Status: AC
  Administered 2022-04-21: 2 g via INTRAVENOUS
  Filled 2022-04-21: qty 50

## 2022-04-21 MED ORDER — PHENOL 1.4 % MT LIQD
1.0000 | OROMUCOSAL | Status: DC | PRN
  Administered 2022-04-21: 1 via OROMUCOSAL
  Filled 2022-04-21: qty 177

## 2022-04-21 MED ORDER — MAGNESIUM SULFATE 2 GM/50ML IV SOLN
2.0000 g | Freq: Once | INTRAVENOUS | Status: AC
  Administered 2022-04-22: 2 g via INTRAVENOUS
  Filled 2022-04-21: qty 50

## 2022-04-21 MED ORDER — BUTAMBEN-TETRACAINE-BENZOCAINE 2-2-14 % EX AERO
INHALATION_SPRAY | CUTANEOUS | Status: DC | PRN
  Administered 2022-04-21: 2 via TOPICAL

## 2022-04-21 MED ORDER — SODIUM CHLORIDE 0.9 % IV SOLN: INTRAVENOUS | Status: DC

## 2022-04-21 MED ORDER — PHENYLEPHRINE 80 MCG/ML (10ML) SYRINGE FOR IV PUSH (FOR BLOOD PRESSURE SUPPORT)
PREFILLED_SYRINGE | INTRAVENOUS | Status: DC | PRN
  Administered 2022-04-21 (×4): 160 ug via INTRAVENOUS
  Administered 2022-04-21: 80 ug via INTRAVENOUS

## 2022-04-21 MED ORDER — EPHEDRINE SULFATE-NACL 50-0.9 MG/10ML-% IV SOSY: PREFILLED_SYRINGE | INTRAVENOUS | Status: DC | PRN

## 2022-04-21 MED ORDER — ROSUVASTATIN CALCIUM 5 MG PO TABS
5.0000 mg | ORAL_TABLET | Freq: Every day | ORAL | Status: DC
  Administered 2022-04-22: 5 mg via ORAL
  Filled 2022-04-21: qty 1

## 2022-04-21 MED ORDER — PROPOFOL 10 MG/ML IV BOLUS
INTRAVENOUS | Status: DC | PRN
  Administered 2022-04-21 (×2): 30 mg via INTRAVENOUS

## 2022-04-21 NOTE — Progress Notes (Signed)
Upper scope out; dr turner conferring with Dr Debara Pickett before proceding with cardioversion.

## 2022-04-21 NOTE — Transfer of Care (Signed)
Immediate Anesthesia Transfer of Care Note  Patient: Gina Duncan  Procedure(s) Performed: TRANSESOPHAGEAL ECHOCARDIOGRAM (TEE) CARDIOVERSION  Patient Location: Short Stay  Anesthesia Type:MAC  Level of Consciousness: drowsy  Airway & Oxygen Therapy: Patient Spontanous Breathing  Post-op Assessment: Report given to RN and Post -op Vital signs reviewed and stable  Post vital signs: Reviewed and stable  Last Vitals:  Vitals Value Taken Time  BP 97/38 04/21/22 1517  Temp    Pulse 68 04/21/22 1518  Resp 18 04/21/22 1518  SpO2 92 % 04/21/22 1518  Vitals shown include unvalidated device data.  Last Pain:  Vitals:   04/21/22 1326  TempSrc: Oral  PainSc: 0-No pain         Complications: No notable events documented.

## 2022-04-21 NOTE — Progress Notes (Signed)
   Successful DCCV today to NSR. Will stop IV diltiazem. Continue metoprolol and digoxin. Would monitor overnight to watch for recurrent afib - if stable tomorrow morning could discharge. D/w Dr.  Markus Jarvis.  Pixie Casino, MD, [facility], [place] Director of the Advanced Lipid Disorders &  Cardiovascular Risk Reduction Clinic Diplomate of the American Board of Clinical Lipidology Attending Cardiologist  Direct Dial: [phone]  Fax: [phone]  Website:  [email or web address]

## 2022-04-21 NOTE — CV Procedure (Signed)
    PROCEDURE NOTE:  Procedure:  Transesophageal echocardiogram Operator:  Fransico Him, MD Indications:Atrial Fibrillation Complications: None  During this procedure the patient is administered a total of Propofol 220 mg to achieve and maintain moderate conscious sedation.  The patient's heart rate, blood pressure, and oxygen saturation are monitored continuously during the procedure by anesthesia.   Results: Normal LV size and function Normal RV size and function Severely dilated RA Severely dilated LA with severe spontaneous echo contrast.  Large LA appendage with no thrombus in LA or LAA.    Normal TV with moderate TR Normal PV with trivial PR Degenerative MV with mild to moderate MAC and mildly calcified AMVL with trivial MR Trileaflet AV with mild aortic valve sclerosis and mild calcification with trivial AR. Normal interatrial septum with no evidence of shunt by colorflow dopper  Moderate Grade 3 atherosclerosis of the thoracic and ascending aorta.  The patient tolerated the procedure well and went on to DCCV   Electrical Cardioversion Procedure Note Gina Duncan [id number] December 09, 1940  Procedure: Electrical Cardioversion Indications:  Atrial Fibrillation  Time Out: Verified patient identification, verified procedure,medications/allergies/relevent history reviewed, required imaging and test results available.  Performed  Procedure Details  Cardioversion was done with synchronized biphasic defibrillation with AP pads with 150watts.  The patient converted to normal sinus rhythm. The patient tolerated the procedure well   IMPRESSION:  Successful cardioversion of atrial fibrillation    Hailie Searight 04/21/2022, 10:36 AM    Signed: Fransico Him, MD [facility]

## 2022-04-21 NOTE — Progress Notes (Signed)
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   Overnight Events: none  Gina Duncan reports feeling well this morning. She is able to ambulate well, much better than before. No new concerns. Plan for TEE/DC CV this afternoon.   Objective:  Vital signs in last 24 hours: Vitals:   04/20/22 1932 04/21/22 0005 04/21/22 0528 04/21/22 0729  BP: 126/60 130/63 134/67 (!) 148/75  Pulse: 91 76 76 96  Resp: '20 20 20 18  '$ Temp: 97.9 F (36.6 C) 97.9 F (36.6 C) 97.6 F (36.4 C) 98 F (36.7 C)  TempSrc: Oral Oral Oral Oral  SpO2: 93% 93% 94% 94%  Weight:   62.4 kg   Height:       Supplemental O2: Room Air SpO2: 94 %   Physical Exam:  Constitutional: alert, laying in bed comfortably, in no acute distress HENT: normocephalic atraumatic Neck: supple Cardiovascular: irregular rhythm, normal rate Pulmonary/Chest: normal work of breathing on room air MSK: normal bulk and tone, no LE edema Neurological: alert & oriented x 3 Skin: warm and dry Psych: normal mood and behavior  Filed Weights   04/19/22 0546 04/20/22 0443 04/21/22 0528  Weight: 63.2 kg 62.8 kg 62.4 kg     Intake/Output Summary (Last 24 hours) at 04/21/2022 0744 Last data filed at 04/21/2022 5465 Gross per 24 hour  Intake 344.74 ml  Output 1800 ml  Net -1455.26 ml    Net IO Since Admission: -1,491.07 mL [04/21/22 0744]  Pertinent Labs:    Latest Ref Rng & Units 04/19/2022    3:26 AM 04/18/2022   12:38 PM  CBC  WBC 4.0 - 10.5 K/uL 7.3  9.6   Hemoglobin 12.0 - 15.0 g/dL 12.0  13.5   Hematocrit 36.0 - 46.0 % 37.2  40.9   Platelets 150 - 400 K/uL 334  378        Latest Ref Rng & Units 04/20/2022    4:46 AM 04/19/2022    3:26 AM 04/18/2022   12:38 PM  CMP  Glucose 70 - 99 mg/dL 105  106  149   BUN 8 - 23  mg/dL '12  18  19   '$ Creatinine 0.44 - 1.00 mg/dL 0.90  0.88  0.94   Sodium 135 - 145 mmol/L 137  140  138   Potassium 3.5 - 5.1 mmol/L 4.8  3.5  3.4   Chloride 98 - 111 mmol/L 97  97  95   CO2 22 - 32 mmol/L 32  33  33   Calcium 8.9 - 10.3 mg/dL 8.7  8.2  8.5   Total Protein 6.5 - 8.1 g/dL   7.0   Total Bilirubin 0.3 - 1.2 mg/dL   0.9   Alkaline Phos 38 - 126 U/L   106   AST 15 - 41 U/L   29   ALT 0 - 44 U/L   15     Imaging: No results found.  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   Atrial fibrillation Etiology likely electrolyte derangements from diuretic therapy and poor PO intake over last 2 weeks. She has remained asymptomatic. Do not suspect ischemic etiology. Her rate has improved on metoprolol, digoxin and diltiazem drip. Cardiology plans for TEE/DC cardioversion this afternoon. Appreciate cardiology's assistance with her care. 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 Without regional wall motion abnormalities. Unclear if new baseline or if secondary to arrhythmia. Will need to be reassessed outpatient. RAP under 8 mmg, appears euvolemic on exam. Currently on metoprolol, will need outpatient follow up.  -continue metoprolol -will have outpatient follow up with Dr. Percival Spanish  -follow up echocardiogram after resolution of arrhythmia if resolves  Hx of CAD w/ MI of LAD DES 2017 and rotablator Do not suspect ischemic  etiology for her atrial fibrillation. LDL 21.  -continue eliquis -decrease rosuvastatin to 5 mg  Electrolyte abnormalities Repeat magnesium at 1.5, will give 2 g mag sulfate. K is stable at 4.1. Will continue to monitor. -repeat magnesium  Left lower lobe mass Will need outpatient xray follow up to assure resolution. Unclear etiology but if she did have pneumonia past two weeks, was receiving amoxicillin and flagyl for her dental infection.   Diet: NPO for TEE/DC CV IVF: None,None VTE: DOAC Code: Full PT/OT recs: no PT or OT follow up  Dispo: Anticipated discharge to Home in 1 day pending cardioversion today.    Angelique Blonder, DO Internal Medicine Resident PGY-1 Please contact the on call pager after 5 pm and on weekends at [phone].

## 2022-04-21 NOTE — Interval H&P Note (Signed)
History and Physical Interval Note:  04/21/2022 10:35 AM  Gina Duncan  has presented today for surgery, with the diagnosis of afib.  The various methods of treatment have been discussed with the patient and family. After consideration of risks, benefits and other options for treatment, the patient has consented to  Procedure(s): TRANSESOPHAGEAL ECHOCARDIOGRAM (TEE) (N/A) CARDIOVERSION (N/A) as a surgical intervention.  The patient's history has been reviewed, patient examined, no change in status, stable for surgery.  I have reviewed the patient's chart and labs.  Questions were answered to the patient's satisfaction.     Fransico Him

## 2022-04-21 NOTE — Anesthesia Procedure Notes (Signed)
Procedure Name: General with mask airway Date/Time: 04/21/2022 2:39 PM  Performed by: Erick Colace, CRNAPre-anesthesia Checklist: Patient identified, Emergency Drugs available, Suction available and Patient being monitored Patient Re-evaluated:Patient Re-evaluated prior to induction Oxygen Delivery Method: Nasal cannula Preoxygenation: Pre-oxygenation with 100% oxygen Induction Type: IV induction Airway Equipment and Method: Bite block Dental Injury: Teeth and Oropharynx as per pre-operative assessment

## 2022-04-21 NOTE — Anesthesia Preprocedure Evaluation (Addendum)
Anesthesia Evaluation  Patient identified by MRN, date of birth, ID band Patient awake    Reviewed: Allergy & Precautions, NPO status , Patient's Chart, lab work & pertinent test results, reviewed documented beta blocker date and time   History of Anesthesia Complications Negative for: history of anesthetic complications  Airway Mallampati: II  TM Distance: >3 FB Neck ROM: Full    Dental  (+) Dental Advisory Given, Partial Lower   Pulmonary neg pulmonary ROS,    Pulmonary exam normal        Cardiovascular hypertension, Pt. on home beta blockers and Pt. on medications + CAD and +CHF  + dysrhythmias Atrial Fibrillation  Rhythm:Irregular Rate:Tachycardia   '23 TTE - EF 40 to 45%. Global hypokinesis. There is mild  left ventricular hypertrophy. Right ventricular systolic function is mildly reduced. There is moderately elevated pulmonary artery systolic pressure. The estimated right ventricular systolic pressure is 64.1 mmHg. Left atrial size was mildly dilated. Trivial mitral valve regurgitation. Tricuspid valve regurgitation is mild to moderate.    Neuro/Psych negative neurological ROS  negative psych ROS   GI/Hepatic Neg liver ROS, GERD  Medicated and Controlled,  Endo/Other  negative endocrine ROS  Renal/GU negative Renal ROS     Musculoskeletal negative musculoskeletal ROS (+)   Abdominal   Peds  Hematology  INR 1.3    Anesthesia Other Findings   Reproductive/Obstetrics                            Anesthesia Physical Anesthesia Plan  ASA: 3  Anesthesia Plan: MAC and General   Post-op Pain Management: Minimal or no pain anticipated   Induction:   PONV Risk Score and Plan: 2 and Propofol infusion and Treatment may vary due to age or medical condition  Airway Management Planned: Nasal Cannula and Natural Airway  Additional Equipment: None  Intra-op Plan:   Post-operative Plan:    Informed Consent: I have reviewed the patients History and Physical, chart, labs and discussed the procedure including the risks, benefits and alternatives for the proposed anesthesia with the patient or authorized representative who has indicated his/her understanding and acceptance.       Plan Discussed with: CRNA and Anesthesiologist  Anesthesia Plan Comments: (May begin procedure as MAC with conversion to GA as indicated by procedure )       Anesthesia Quick Evaluation

## 2022-04-21 NOTE — Progress Notes (Signed)
Progress Note  Patient Name: Gina Duncan. Meraz Date of Encounter: 04/21/2022  [facility] Cardiologist: Hochrein  Subjective   81 yo with hx of CAD, atrial fib   A-fib rate appears better controlled today.  Plan is for TEE/DCCV at 1 PM today.  Echo yesterday showed LVEF 40 to 45% with global hypokinesis.  There is mild left atrial enlargement and mild LVH with mild to moderate TR.  Inpatient Medications    Scheduled Meds:  apixaban  5 mg Oral BID   digoxin  0.125 mg Oral Daily   metoprolol tartrate  25 mg Oral BID   rosuvastatin  10 mg Oral Daily   Continuous Infusions:  sodium chloride 20 mL/hr at 04/21/22 0329   diltiazem (CARDIZEM) infusion 5 mg/hr (04/21/22 0736)   PRN Meds: acetaminophen **OR** acetaminophen, pantoprazole, polyethylene glycol   Vital Signs    Vitals:   04/20/22 1932 04/21/22 0005 04/21/22 0528 04/21/22 0729  BP: 126/60 130/63 134/67 (!) 148/75  Pulse: 91 76 76 96  Resp: '20 20 20 18  '$ Temp: 97.9 F (36.6 C) 97.9 F (36.6 C) 97.6 F (36.4 C) 98 F (36.7 C)  TempSrc: Oral Oral Oral Oral  SpO2: 93% 93% 94% 94%  Weight:   62.4 kg   Height:        Intake/Output Summary (Last 24 hours) at 04/21/2022 0951 Last data filed at 04/21/2022 9417 Gross per 24 hour  Intake 226.74 ml  Output 1800 ml  Net -1573.26 ml      04/21/2022    5:28 AM 04/20/2022    4:43 AM 04/19/2022    5:46 AM  Last 3 Weights  Weight (lbs) 137 lb 9.6 oz 138 lb 8 oz 139 lb 4.8 oz  Weight (kg) 62.415 kg 62.823 kg 63.186 kg      Telemetry    A-fib with controlled ventricular response-personally Reviewed  ECG    N/A  Physical Exam   GEN: Thin, elderly female, no acute distress Neck: No JVD Cardiac: Irregularly irregular, 2/6 systolic murmur at LUSB Respiratory: Reduced breath sounds and rales/rhonchi on the right side. GI: Soft, nontender, non-distended  MS: No edema; No deformity. Neuro:  Nonfocal  Psych: Normal affect   Labs    High Sensitivity  Troponin:   Recent Labs  Lab 04/18/22 1238 04/18/22 1520  TROPONINIHS 63* 53*     Chemistry Recent Labs  Lab 04/18/22 1238 04/19/22 0326 04/20/22 0446 04/21/22 0735  NA 138 140 137 139  K 3.4* 3.5 4.8 4.1  CL 95* 97* 97* 97*  CO2 33* 33* 32 32  GLUCOSE 149* 106* 105* 115*  BUN '19 18 12 11  '$ CREATININE 0.94 0.88 0.90 0.87  CALCIUM 8.5* 8.2* 8.7* 8.9  MG 1.1* 1.5* 1.7 1.5*  PROT 7.0  --   --   --   ALBUMIN 3.2*  --   --   --   AST 29  --   --   --   ALT 15  --   --   --   ALKPHOS 106  --   --   --   BILITOT 0.9  --   --   --   GFRNONAA >60 >60 >60 >60  ANIONGAP '10 10 8 10    '$ Lipids  Recent Labs  Lab 04/18/22 1716  CHOL 80  TRIG 113  HDL 36*  LDLCALC 21  CHOLHDL 2.2    Hematology Recent Labs  Lab 04/18/22 1238 04/19/22 0326  WBC 9.6 7.3  RBC 4.36 3.97  HGB 13.5 12.0  HCT 40.9 37.2  MCV 93.8 93.7  MCH 31.0 30.2  MCHC 33.0 32.3  RDW 13.1 13.3  PLT 378 334   Thyroid  Recent Labs  Lab 04/18/22 1716  TSH 3.122    BNPNo results for input(s): "BNP", "PROBNP" in the last 168 hours.  DDimer No results for input(s): "DDIMER" in the last 168 hours.   Radiology    ECHOCARDIOGRAM COMPLETE  Result Date: 04/19/2022    ECHOCARDIOGRAM REPORT   Patient Name:   Gina Duncan Date of Exam: 04/19/2022 Medical Rec #:  [id number]         Height:       65.0 in Accession #:    [id number]        Weight:       139.3 lb Date of Birth:  [birth year]         BSA:          1.696 m Patient Age:    3 years          BP:           123/62 mmHg Patient Gender: F                 HR:           107 bpm. Exam Location:  Inpatient Procedure: 2D Echo, Color Doppler and Cardiac Doppler Indications:     Atrial Fibrillation I48.91  History:         Patient has no prior history of Echocardiogram examinations.                  CAD; Risk Factors:Hypertension. Cancer, hx of radiation                  therapy.  Sonographer:     Eartha Inch Referring Phys:  [place] Diagnosing Phys:  Oswaldo Milian MD  Sonographer Comments: Uncontrolled HR. IMPRESSIONS  1. Technically difficult study due to poor windows and in Afib with RVR. Left ventricular ejection fraction, by estimation, is 40 to 45%. The left ventricle has mildly decreased function. The left ventricle demonstrates global hypokinesis. There is mild  left ventricular hypertrophy. Left ventricular diastolic parameters are indeterminate.  2. Right ventricular systolic function is mildly reduced. The right ventricular size is normal. There is moderately elevated pulmonary artery systolic pressure. The estimated right ventricular systolic pressure is 87.5 mmHg.  3. Left atrial size was mildly dilated.  4. The mitral valve is degenerative. Trivial mitral valve regurgitation. Moderate mitral annular calcification.  5. Tricuspid valve regurgitation is mild to moderate.  6. The aortic valve was not well visualized. Aortic valve regurgitation is not visualized. Aortic valve sclerosis/calcification is present, without any evidence of aortic stenosis.  7. The inferior vena cava is normal in size with greater than 50% respiratory variability, suggesting right atrial pressure of 3 mmHg. FINDINGS  Left Ventricle: Left ventricular ejection fraction, by estimation, is 40 to 45%. The left ventricle has mildly decreased function. The left ventricle demonstrates global hypokinesis. The left ventricular internal cavity size was normal in size. There is  mild left ventricular hypertrophy. Left ventricular diastolic parameters are indeterminate. Right Ventricle: The right ventricular size is normal. Right vetricular wall thickness was not well visualized. Right ventricular systolic function is mildly reduced. There is moderately elevated pulmonary artery systolic pressure. The tricuspid regurgitant velocity is 3.25 m/s, and with an assumed right atrial pressure of 3 mmHg, the estimated right  ventricular systolic pressure is 98.1 mmHg. Left Atrium: Left  atrial size was mildly dilated. Right Atrium: Right atrial size was normal in size. Pericardium: There is no evidence of pericardial effusion. Mitral Valve: The mitral valve is degenerative in appearance. Moderate mitral annular calcification. Trivial mitral valve regurgitation. Tricuspid Valve: The tricuspid valve is normal in structure. Tricuspid valve regurgitation is mild to moderate. Aortic Valve: The aortic valve was not well visualized. Aortic valve regurgitation is not visualized. Aortic valve sclerosis/calcification is present, without any evidence of aortic stenosis. Pulmonic Valve: The pulmonic valve was not well visualized. Pulmonic valve regurgitation is not visualized. Aorta: The aortic root is normal in size and structure. Venous: The inferior vena cava is normal in size with greater than 50% respiratory variability, suggesting right atrial pressure of 3 mmHg. IAS/Shunts: The interatrial septum was not well visualized.  LEFT VENTRICLE PLAX 2D LVIDd:         3.50 cm LVIDs:         2.90 cm LV PW:         0.90 cm LV IVS:        0.90 cm LVOT diam:     1.70 cm LVOT Area:     2.27 cm  LEFT ATRIUM             Index        RIGHT ATRIUM           Index LA diam:        3.50 cm 2.06 cm/m   RA Area:     16.30 cm LA Vol (A2C):   64.7 ml 38.14 ml/m  RA Volume:   46.80 ml  27.59 ml/m LA Vol (A4C):   66.4 ml 39.14 ml/m LA Biplane Vol: 68.1 ml 40.15 ml/m   AORTA Ao Root diam: 3.20 cm TRICUSPID VALVE TR Peak grad:   42.2 mmHg TR Vmax:        325.00 cm/s  SHUNTS Systemic Diam: 1.70 cm Oswaldo Milian MD Electronically signed by Oswaldo Milian MD Signature Date/Time: 04/19/2022/5:01:15 PM    Final (Updated)     Cardiac Studies   Echo above  Patient Profile     81 y.o. female admitted with rapid atrial fibrillation.  Assessment & Plan     Atrial fibrillation: Patient remains tachycardic.  She is scheduled for echo today   Digoxin 0.125 mg started, rate controlled - plan for TEE/DCCV today at  1 pm. Can likely stop diltiazem gtts after. Continue eliquis 5 BID  Continue metoprolol 25 mg BID Continue digoxin 0.125 mg daily - check level after 5 doses.  2.  Hyperlipidemia :   Continue rosuvastatin, can decrease to 5 mg daily. LDL 21.    3.  Acute systolic heart failure: LVEF now 40 to 45% with global hypokinesis.  May be rate related in the setting of A-fib with RVR.  Plan proceed with TEE/cardioversion today.  We will further titrate guideline directed medical therapy for heart failure as blood pressure tolerates.  She appears euvolemic on exam.  Possible discharge later today.  Plan follow-up with Dr. Percival Spanish in [place].  For questions or updates, please contact [place] Please consult [email or web address] for contact info under   Pixie Casino, MD, FACC, [place] Director of the [facility] Diplomate of the American Board of Clinical Lipidology Attending Cardiologist  Direct Dial: [phone]  Fax: [phone]  Website:  [email or web address]  Pixie Casino, MD  04/21/2022, 9:51 AM

## 2022-04-21 NOTE — Anesthesia Postprocedure Evaluation (Signed)
Anesthesia Post Note  Patient: Gina Duncan. Mccumbers  Procedure(s) Performed: TRANSESOPHAGEAL ECHOCARDIOGRAM (TEE) CARDIOVERSION     Patient location during evaluation: Endoscopy Anesthesia Type: MAC Level of consciousness: patient cooperative, oriented and sedated Pain management: pain level controlled Vital Signs Assessment: post-procedure vital signs reviewed and stable Respiratory status: nonlabored ventilation, spontaneous breathing and respiratory function stable Cardiovascular status: blood pressure returned to baseline and stable Postop Assessment: no apparent nausea or vomiting Anesthetic complications: no   No notable events documented.  Last Vitals:  Vitals:   04/21/22 1528 04/21/22 1538  BP: (!) 101/50 (!) 109/58  Pulse: 72 69  Resp: 19 (!) 24  Temp:    SpO2: 92% 92%    Last Pain:  Vitals:   04/21/22 1538  TempSrc:   PainSc: 0-No pain                 Shulem Mader,E. Leahmarie Gasiorowski

## 2022-04-22 ENCOUNTER — Other Ambulatory Visit (HOSPITAL_COMMUNITY): Payer: Self-pay

## 2022-04-22 ENCOUNTER — Telehealth (HOSPITAL_COMMUNITY): Payer: Self-pay

## 2022-04-22 DIAGNOSIS — I2510 Atherosclerotic heart disease of native coronary artery without angina pectoris: Secondary | ICD-10-CM | POA: Diagnosis not present

## 2022-04-22 DIAGNOSIS — I110 Hypertensive heart disease with heart failure: Secondary | ICD-10-CM | POA: Diagnosis not present

## 2022-04-22 DIAGNOSIS — E878 Other disorders of electrolyte and fluid balance, not elsewhere classified: Secondary | ICD-10-CM | POA: Diagnosis not present

## 2022-04-22 DIAGNOSIS — I5022 Chronic systolic (congestive) heart failure: Secondary | ICD-10-CM | POA: Diagnosis not present

## 2022-04-22 DIAGNOSIS — I4891 Unspecified atrial fibrillation: Secondary | ICD-10-CM | POA: Diagnosis not present

## 2022-04-22 LAB — MAGNESIUM: Magnesium: 1.6 mg/dL — ABNORMAL LOW (ref 1.7–2.4)

## 2022-04-22 MED ORDER — MAGNESIUM SULFATE 2 GM/50ML IV SOLN
2.0000 g | Freq: Once | INTRAVENOUS | Status: AC
  Administered 2022-04-22: 2 g via INTRAVENOUS
  Filled 2022-04-22: qty 50

## 2022-04-22 MED ORDER — METOPROLOL TARTRATE 25 MG PO TABS
25.0000 mg | ORAL_TABLET | Freq: Two times a day (BID) | ORAL | 0 refills | Status: DC
  Filled 2022-04-22: qty 60, 30d supply, fill #0

## 2022-04-22 MED ORDER — APIXABAN 5 MG PO TABS
5.0000 mg | ORAL_TABLET | Freq: Two times a day (BID) | ORAL | 0 refills | Status: DC
  Filled 2022-04-22: qty 60, 30d supply, fill #0

## 2022-04-22 MED ORDER — MAGNESIUM OXIDE 400 MG PO TABS
ORAL_TABLET | ORAL | 0 refills | Status: DC
  Filled 2022-04-22: qty 30, 60d supply, fill #0

## 2022-04-22 MED ORDER — ROSUVASTATIN CALCIUM 5 MG PO TABS
5.0000 mg | ORAL_TABLET | Freq: Every day | ORAL | 0 refills | Status: DC
  Filled 2022-04-22: qty 30, 30d supply, fill #0

## 2022-04-22 MED ORDER — DIGOXIN 125 MCG PO TABS
0.1250 mg | ORAL_TABLET | Freq: Every day | ORAL | 0 refills | Status: DC
  Filled 2022-04-22: qty 30, 30d supply, fill #0

## 2022-04-22 NOTE — TOC Benefit Eligibility Note (Signed)
Patient Teacher, English as a foreign language completed.    The patient is currently admitted and upon discharge could be taking Eliquis '5mg'$ .  The current 30 day co-pay is $47.00.   The patient is insured through [place], [place] Patient Advocate Specialist [place] Patient Advocate Team Direct Number: [phone]  Fax: [phone]

## 2022-04-22 NOTE — Discharge Instructions (Addendum)
You were hospitalized for atrial fibrillation and low magnesium and potassium. Your heart rate improved after cardioversion. We replenished your electrolytes.  You will continue the metoprolol, digoxin and eliquis. We will add on magnesium supplementation. Please make sure to go to your cardiology appointment next week. Thank you for allowing [place] to be part of your care.   We arranged for you to follow up at:  -Cardiology in [place] on Tuesday 8/29 at 10:55 am   Please note these changes made to your medications:   Please START taking:  -Metoprolol 25 mg twice daily -Digoxin 0.125 mg daily -Eliquis 5 mg twice daily for 4 weeks  -Magnesium Oxide 400 mg every other day -changed dose of Rosuvastatin to 5 mg daily  Please STOP taking:  -Chlorthalidone (can discuss with your primary care doctor) -Propanolol   Please call our clinic if you have any questions or concerns, we may be able to help and keep you from a long and expensive emergency room wait. Our clinic and after hours phone number is [phone], the best time to call is Monday through Friday 9 am to 4 pm but there is always someone available 24/7 if you have an emergency.    Information on my medicine - ELIQUIS (apixaban)  This medication education was reviewed with me or my healthcare representative as part of my discharge preparation.    Why was Eliquis prescribed for you? Eliquis was prescribed for you to reduce the risk of a blood clot forming that can cause a stroke if you have a medical condition called atrial fibrillation (a type of irregular heartbeat).  What do You need to know about Eliquis ? Take your Eliquis TWICE DAILY - one tablet in the morning and one tablet in the evening with or without food. If you have difficulty swallowing the tablet whole please discuss with your pharmacist how to take the medication safely.  Take Eliquis exactly as prescribed by your doctor and DO NOT stop taking Eliquis without  talking to the doctor who prescribed the medication.  Stopping may increase your risk of developing a stroke.  Refill your prescription before you run out.  After discharge, you should have regular check-up appointments with your healthcare provider that is prescribing your Eliquis.  In the future your dose may need to be changed if your kidney function or weight changes by a significant amount or as you get older.  What do you do if you miss a dose? If you miss a dose, take it as soon as you remember on the same day and resume taking twice daily.  Do not take more than one dose of ELIQUIS at the same time to make up a missed dose.  Important Safety Information A possible side effect of Eliquis is bleeding. You should call your healthcare provider right away if you experience any of the following: Bleeding from an injury or your nose that does not stop. Unusual colored urine (red or dark brown) or unusual colored stools (red or black). Unusual bruising for unknown reasons. A serious fall or if you hit your head (even if there is no bleeding).  Some medicines may interact with Eliquis and might increase your risk of bleeding or clotting while on Eliquis. To help avoid this, consult your healthcare provider or pharmacist prior to using any new prescription or non-prescription medications, including herbals, vitamins, non-steroidal anti-inflammatory drugs (NSAIDs) and supplements.  This website has more information on Eliquis (apixaban): http://www.eliquis.com/eliquis/home

## 2022-04-22 NOTE — Telephone Encounter (Signed)
Pharmacy Patient Advocate Encounter  Insurance verification completed.    The patient is insured through [facility]   The patient is currently admitted and ran test claims for the following: $47.00.  Copays and coinsurance results were relayed to Inpatient clinical team.

## 2022-04-22 NOTE — Discharge Summary (Signed)
Name: Gina Duncan. Gina Duncan: [id number] DOB: [birth year] 81 y.o. PCP: Pcp, No  Date of Admission: 04/18/2022 11:58 AM Date of Discharge: 04/22/2022 Attending Physician: Dr. Angelia Mould  Discharge Diagnosis: Principal Problem:   Atrial fibrillation with RVR [name]) Active Problems:   Coronary atherosclerosis of native coronary artery   HTN (hypertension)   Hypokalemia   Hypomagnesemia    Discharge Medications: Allergies as of 04/22/2022   No Known Allergies      Medication List     STOP taking these medications    chlorthalidone 25 MG tablet Commonly known as: HYGROTON   propranolol 80 MG tablet Commonly known as: INDERAL       TAKE these medications    acetaminophen 325 MG tablet Commonly known as: TYLENOL Take 325 mg by mouth daily as needed for mild pain or headache.   apixaban 5 MG Tabs tablet Commonly known as: ELIQUIS Take 1 tablet (5 mg total) by mouth 2 (two) times daily.   aspirin EC 81 MG tablet Take 81 mg by mouth in the morning.   digoxin 0.125 MG tablet Commonly known as: LANOXIN Take 1 tablet (0.125 mg total) by mouth daily. Start taking on: April 23, 2022   magnesium oxide 400 MG tablet Commonly known as: MAG-OX Take by mouth every other day.   metoprolol tartrate 25 MG tablet Commonly known as: LOPRESSOR Take 1 tablet (25 mg total) by mouth 2 (two) times daily.   omeprazole 20 MG tablet Commonly known as: PRILOSEC OTC Take 20 mg by mouth daily as needed (for heartburn).   rosuvastatin 5 MG tablet Commonly known as: CRESTOR Take 1 tablet (5 mg total) by mouth daily. Start taking on: April 23, 2022 What changed:  medication strength how much to take when to take this        Disposition and follow-up:   Gina Duncan was discharged from [facility] in Good condition.  At the hospital follow up visit please address:  1.  Follow-up:  a. Atrial Fibrillation: taking metoprolol and digoxin    b.  Hypomagnesemia: repeat level   c. Hypokalemia: repeat BMP   d. Left lingula infiltrate: repeat Chest x-ray to check for resolution  2.  Labs / imaging needed at time of follow-up:  -Digoxin level  -BMP -Magnesium level -Chest X-ray  3.  Pending labs/ test needing follow-up: none  4.  Medication Changes  Metoprolol 25 mg twice daily  Digoxin 0.125 mg daily  Eliquis 5 mg twice daily  Rosuvastatin 5 mg daily  Magnesium Oxide 400 mg every other day  Follow-up Appointments: -[facility] in [place] on 8/29 at 10:55am  -Patient reports she will contact PCP about earlier appointment.  Hospital Course by problem list: Atrial Fibrillation Patient presents with persistent weakness, nausea and vomiting since recent dental infection, treated with amoxicillin and flagyl for 7 days. Continued to feel fatigued and weak requiring assistance with ambulation. EKG in ED showed atrial fibrillation with RVR. TSH normal. Cardiac enzymes did not suggest acute ACS. Cardiology consulted and started her on metoprolol and digoxin for rate control. She was placed on diltiazem for better rate control followed by TEE/DC CV. A-fib could be from recent infection or electrolyte disturbances due to poor nutrition. CHA2DS2VASc score of 5 which is moderate-high stroke risk, she was started on Eliquis. She was cardioverted to sinus rhythm. On morning of discharge, she is feeling well, HR normal and normal sinus rhythm.   HFrEF, NYHA I, Stage B  Echocardiogram showed EF 40-45% with global hypokinesis. Without regional wall motion abnormalities. Unclear if new baseline or if secondary to arrhythmia. Will need to be reassessed outpatient. RAP under 8 mmg, appears euvolemic during hospital course. Currently on metoprolol, will need outpatient follow up for GDMT.   Left lingula infiltrate CXR showed hazy opacities concern for pneumonia. Patient denies cough, fever, chills, or dyspnea. On exam, no signs of  pnuemonia. If she did have pneumonia prior to admission, she was already on amoxicillin and flagyl for her dental infection. Recommend repeat CXR in 3-4 weeks to assure resolution.    Electrolytes Abnormalities  Patient presented with Magnesium 1.1, Potassium 3.4. Phosphorus 3.2. Replenished electrolytes. Hypokalemia likely from thiazide use and recent poor po intake. She will be discharged with magnesium supplement.    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 She takes propanolol and chlorthalidone at home. Held both during hospital stay. Cardiology has her on metoprolol for rate control. Given her BP is at goal during her hospital course, consider stopping chlorthalidone. This can be discussed at her PCP visit.    Pyuria Proteinuria  UA at ED showed proteinuria, leukocytes and rare bacteria. Patient denies any urinary symptoms.   HLD LDL 21. Rosuvastatin decreased to 5 mg.   Prediabetes A1c 5.9. Can follow up outpatient with PCP.     Discharge Subjective: Patient is feeling well after cardioversion. She feels her strength has returned. No new concerns this morning.   Discharge Exam:   BP 138/62   Pulse 67   Temp 98 F (36.7 C) (Oral)   Resp 18   Ht '5\' 5"'$  (1.651 m)   Wt 62.3 kg   SpO2 97%   BMI 22.86 kg/m  Constitutional: well-appearing, in no acute distress HENT: normocephalic atraumatic Neck: supple Cardiovascular: regular rate and rhythm Pulmonary/Chest: normal work of breathing on room air MSK: normal bulk and tone Neurological: alert & oriented x 3 Skin: warm and dry Psych: normal mood and behavior  Pertinent Labs, Studies, and Procedures:     Latest Ref Rng & Units 04/19/2022    3:26 AM 04/18/2022   12:38 PM  CBC  WBC 4.0 - 10.5 K/uL 7.3  9.6   Hemoglobin 12.0 - 15.0 g/dL 12.0  13.5   Hematocrit 36.0 - 46.0 % 37.2  40.9   Platelets 150 - 400 K/uL 334  378        Latest Ref Rng & Units 04/21/2022    7:35 AM 04/20/2022     4:46 AM 04/19/2022    3:26 AM  CMP  Glucose 70 - 99 mg/dL 115  105  106   BUN 8 - 23 mg/dL '11  12  18   '$ Creatinine 0.44 - 1.00 mg/dL 0.87  0.90  0.88   Sodium 135 - 145 mmol/L 139  137  140   Potassium 3.5 - 5.1 mmol/L 4.1  4.8  3.5   Chloride 98 - 111 mmol/L 97  97  97   CO2 22 - 32 mmol/L 32  32  33   Calcium 8.9 - 10.3 mg/dL 8.9  8.7  8.2     ECHOCARDIOGRAM COMPLETE  Result Date: 04/19/2022    ECHOCARDIOGRAM REPORT   Patient Name:   Farrel Gordon Shams Date of Exam: 04/19/2022 Medical Rec #:  [id number]         Height:       65.0 in Accession #:  [id number]        Weight:       139.3 lb Date of Birth:  [birth year]         BSA:          1.696 m Patient Age:    59 years          BP:           123/62 mmHg Patient Gender: F                 HR:           107 bpm. Exam Location:  Inpatient Procedure: 2D Echo, Color Doppler and Cardiac Doppler Indications:     Atrial Fibrillation I48.91  History:         Patient has no prior history of Echocardiogram examinations.                  CAD; Risk Factors:Hypertension. Cancer, hx of radiation                  therapy.  Sonographer:     Eartha Inch Referring Phys:  [place] Diagnosing Phys: Oswaldo Milian MD  Sonographer Comments: Uncontrolled HR. IMPRESSIONS  1. Technically difficult study due to poor windows and in Afib with RVR. Left ventricular ejection fraction, by estimation, is 40 to 45%. The left ventricle has mildly decreased function. The left ventricle demonstrates global hypokinesis. There is mild  left ventricular hypertrophy. Left ventricular diastolic parameters are indeterminate.  2. Right ventricular systolic function is mildly reduced. The right ventricular size is normal. There is moderately elevated pulmonary artery systolic pressure. The estimated right ventricular systolic pressure is 82.9 mmHg.  3. Left atrial size was mildly dilated.  4. The mitral valve is degenerative. Trivial mitral valve regurgitation. Moderate  mitral annular calcification.  5. Tricuspid valve regurgitation is mild to moderate.  6. The aortic valve was not well visualized. Aortic valve regurgitation is not visualized. Aortic valve sclerosis/calcification is present, without any evidence of aortic stenosis.  7. The inferior vena cava is normal in size with greater than 50% respiratory variability, suggesting right atrial pressure of 3 mmHg. FINDINGS  Left Ventricle: Left ventricular ejection fraction, by estimation, is 40 to 45%. The left ventricle has mildly decreased function. The left ventricle demonstrates global hypokinesis. The left ventricular internal cavity size was normal in size. There is  mild left ventricular hypertrophy. Left ventricular diastolic parameters are indeterminate. Right Ventricle: The right ventricular size is normal. Right vetricular wall thickness was not well visualized. Right ventricular systolic function is mildly reduced. There is moderately elevated pulmonary artery systolic pressure. The tricuspid regurgitant velocity is 3.25 m/s, and with an assumed right atrial pressure of 3 mmHg, the estimated right ventricular systolic pressure is 93.7 mmHg. Left Atrium: Left atrial size was mildly dilated. Right Atrium: Right atrial size was normal in size. Pericardium: There is no evidence of pericardial effusion. Mitral Valve: The mitral valve is degenerative in appearance. Moderate mitral annular calcification. Trivial mitral valve regurgitation. Tricuspid Valve: The tricuspid valve is normal in structure. Tricuspid valve regurgitation is mild to moderate. Aortic Valve: The aortic valve was not well visualized. Aortic valve regurgitation is not visualized. Aortic valve sclerosis/calcification is present, without any evidence of aortic stenosis. Pulmonic Valve: The pulmonic valve was not well visualized. Pulmonic valve regurgitation is not visualized. Aorta: The aortic root is normal in size and structure. Venous: The inferior vena  cava is normal in size with greater  than 50% respiratory variability, suggesting right atrial pressure of 3 mmHg. IAS/Shunts: The interatrial septum was not well visualized.  LEFT VENTRICLE PLAX 2D LVIDd:         3.50 cm LVIDs:         2.90 cm LV PW:         0.90 cm LV IVS:        0.90 cm LVOT diam:     1.70 cm LVOT Area:     2.27 cm  LEFT ATRIUM             Index        RIGHT ATRIUM           Index LA diam:        3.50 cm 2.06 cm/m   RA Area:     16.30 cm LA Vol (A2C):   64.7 ml 38.14 ml/m  RA Volume:   46.80 ml  27.59 ml/m LA Vol (A4C):   66.4 ml 39.14 ml/m LA Biplane Vol: 68.1 ml 40.15 ml/m   AORTA Ao Root diam: 3.20 cm TRICUSPID VALVE TR Peak grad:   42.2 mmHg TR Vmax:        325.00 cm/s  SHUNTS Systemic Diam: 1.70 cm Oswaldo Milian MD Electronically signed by Oswaldo Milian MD Signature Date/Time: 04/19/2022/5:01:15 PM    Final (Updated)    DG Chest 2 View  Result Date: 04/18/2022 CLINICAL DATA:  Nausea, vomiting, history of AFib. EXAM: CHEST - 2 VIEW COMPARISON:  None Available. FINDINGS: The cardiomediastinal silhouette is normal. There are hazy opacities in the lingula with silhouetting of the left heart border. There is no other focal consolidation. There is no pulmonary edema. There is no pleural effusion or pneumothorax The bones are diffusely demineralized. There is no acute osseous abnormality. IMPRESSION: Hazy opacities in the lingula with silhouetting of the left heart border could reflect pneumonia in the correct clinical setting. Recommend follow-up radiographs in 3-4 weeks to assess for resolution to exclude solid lesion. Electronically Signed   By: Valetta Mole M.D.   On: 04/18/2022 12:55     Discharge Instructions: Discharge Instructions     Call MD for:  difficulty breathing, headache or visual disturbances   Complete by: As directed    Call MD for:  extreme fatigue   Complete by: As directed    Call MD for:  hives   Complete by: As directed    Call MD for:   persistant dizziness or light-headedness   Complete by: As directed    Call MD for:  persistant nausea and vomiting   Complete by: As directed    Call MD for:  severe uncontrolled pain   Complete by: As directed    Call MD for:  temperature >100.4   Complete by: As directed    Diet - low sodium heart healthy   Complete by: As directed    Increase activity slowly   Complete by: As directed        Signed: Angelique Blonder, DO 04/22/2022, 2:07 PM   Pager: [phone]

## 2022-04-22 NOTE — TOC Transition Note (Addendum)
Transition of Care (TOC) - CM/SW Discharge Note   Patient Details  Name: Gina Duncan MRN: [id number] Date of Birth: 13-May-1941  Transition of Care [facility]) CM/SW Contact:  Zenon Mayo, RN Phone Number: 04/22/2022, 2:34 PM   Clinical Narrative:    Patient is for dc today, spouse is at bedside, will transport her home today, she has no needs.         Patient Goals and CMS Choice        Discharge Placement                       Discharge Plan and Services                                     Social Determinants of Health (SDOH) Interventions     Readmission Risk Interventions     No data to display

## 2022-04-22 NOTE — Progress Notes (Signed)
Progress Note  Patient Name: Gina Duncan. Caseres Date of Encounter: 04/22/2022  [facility] Cardiologist: Hochrein  Subjective   81 yo with hx of CAD, atrial fib   S/p TEE/DCCV yesterday - maintaining sinus rhythm overnight, HR 60-80.TEE showed severely dilated LA with heavy smoke, mitral valve is degenerative with mild to moderate MAC and trivial MR, aortic valve sclerosis. 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  Inpatient Medications    Scheduled Meds:  apixaban  5 mg Oral BID   digoxin  0.125 mg Oral Daily   metoprolol tartrate  25 mg Oral BID   rosuvastatin  5 mg Oral Daily   Continuous Infusions:  magnesium sulfate bolus IVPB 2 g (04/22/22 0741)   PRN Meds: acetaminophen **OR** acetaminophen, pantoprazole, phenol, polyethylene glycol   Vital Signs    Vitals:   04/21/22 1528 04/21/22 1538 04/21/22 2100 04/22/22 0546  BP: (!) 101/50 (!) 109/58 (!) 127/57 139/63  Pulse: 72 69  69  Resp: 19 (!) 24  18  Temp:   (!) 97.3 F (36.3 C) (!) 97.4 F (36.3 C)  TempSrc:   Oral Oral  SpO2: 92% 92%  97%  Weight:    62.3 kg  Height:        Intake/Output Summary (Last 24 hours) at 04/22/2022 0820 Last data filed at 04/21/2022 1510 Gross per 24 hour  Intake 561.61 ml  Output --  Net 561.61 ml      04/22/2022    5:46 AM 04/21/2022    1:26 PM 04/21/2022    5:28 AM  Last 3 Weights  Weight (lbs) 137 lb 6.4 oz 137 lb 9.1 oz 137 lb 9.6 oz  Weight (kg) 62.324 kg 62.4 kg 62.415 kg      Telemetry    Normal sinus rhythm -personally Reviewed  ECG    N/A  Physical Exam   GEN: Thin, elderly female, no acute distress Neck: No JVD Cardiac: RRR, 2/6 systolic murmur at LUSB Respiratory: Reduced breath sounds and rales/rhonchi on the right side. GI: Soft, nontender, non-distended  MS: No edema; No deformity. Neuro:  Nonfocal  Psych: Normal affect   Labs    High Sensitivity Troponin:   Recent Labs  Lab 04/18/22 1238 04/18/22 1520  TROPONINIHS 63*  53*     Chemistry Recent Labs  Lab 04/18/22 1238 04/19/22 0326 04/20/22 0446 04/21/22 0735 04/22/22 0427  NA 138 140 137 139  --   K 3.4* 3.5 4.8 4.1  --   CL 95* 97* 97* 97*  --   CO2 33* 33* 32 32  --   GLUCOSE 149* 106* 105* 115*  --   BUN _0 --   CREATININE 0.94 0.88 0.90 0.87  --   CALCIUM 8.5* 8.2* 8.7* 8.9  --   MG 1.1* 1.5* 1.7 1.5* 1.6*  PROT 7.0  --   --   --   --   ALBUMIN 3.2*  --   --   --   --   AST 29  --   --   --   --   ALT 15  --   --   --   --   ALKPHOS 106  --   --   --   --   BILITOT 0.9  --   --   --   --   GFRNONAA >60 >60 >60 >60  --   ANIONGAP _1 --  Lipids  Recent Labs  Lab 04/18/22 1716  CHOL 80  TRIG 113  HDL 36*  LDLCALC 21  CHOLHDL 2.2    Hematology Recent Labs  Lab 04/18/22 1238 04/19/22 0326  WBC 9.6 7.3  RBC 4.36 3.97  HGB 13.5 12.0  HCT 40.9 37.2  MCV 93.8 93.7  MCH 31.0 30.2  MCHC 33.0 32.3  RDW 13.1 13.3  PLT 378 334   Thyroid  Recent Labs  Lab 04/18/22 1716  TSH 3.122    BNPNo results for input(s): "BNP", "PROBNP" in the last 168 hours.  DDimer No results for input(s): "DDIMER" in the last 168 hours.   Radiology    No results found.  Cardiac Studies   Echo above  Patient Profile     81 y.o. female admitted with rapid atrial fibrillation.  Assessment & Plan     Atrial fibrillation: Successful DCCV yesterday. Maintaining sinus rhythm. Continue eliquis 5 BID  Continue metoprolol 25 mg BID Continue digoxin 0.125 mg daily - recent level 1.1, but not at steady state. 2.  Hyperlipidemia :   Continue rosuvastatin, can decrease to 5 mg daily. LDL 21. She reported recent 75 pound intentional weight loss.    3.  Acute systolic heart failure: LVEF appears to have improved based on TEE, however, there is severe LAE with heavy smoke. Will continue metoprolol. Consider addition of ARB/ARNI as BP tolerates.  4. Hypomagnesemia - replete with 4G IV  Could be discharged today. Follow-up with  Dr. Percival Spanish in [place].  For questions or updates, please contact [place] Please consult [email or web address] for contact info under   Pixie Casino, MD, FACC, [place] Director of the [facility]  [facility] Diplomate of the American Board of Clinical Lipidology Attending Cardiologist  Direct Dial: [phone]  Fax: [phone]  Website:  [email or web address]  Pixie Casino, MD  04/22/2022, 8:20 AM

## 2022-04-22 NOTE — Progress Notes (Signed)
   04/22/22 1200  Mobility  Activity Ambulated independently in hallway  Level of Assistance Independent  Assistive Device None  Distance Ambulated (ft) 250 ft  Activity Response Tolerated well  $Mobility charge 1 Mobility   Mobility Specialist Progress Note  Received pt EOB having no complaints and agreeable to mobility. Pt was asymptomatic throughout ambulation and returned to room w/o fault. Left EOB w/ call bell in reach and all needs met.   Lucious Groves Mobility Specialist

## 2022-04-23 ENCOUNTER — Encounter (HOSPITAL_COMMUNITY): Payer: Self-pay | Admitting: Cardiology

## 2022-04-28 NOTE — Progress Notes (Unsigned)
Cardiology Clinic Note   Patient Name: Gina Duncan. Kosman Date of Encounter: 04/29/2022  Primary Care Provider:  Pcp, No Primary Cardiologist:  None  Patient Profile    Gina Duncan 81 year old female presents to the clinic today for follow-up evaluation of her atrial fibrillation.  Past Medical History    Past Medical History:  Diagnosis Date   CAD (coronary artery disease)    DES to LAD 2017   Endometrial cancer ([place])    Hypertension    Radiation 06/01/15, 06/12/15, 06/15/15, 06/19/15, 06/21/15   proximal vagina 30 gray   Past Surgical History:  Procedure Laterality Date   CARDIOVERSION N/A 04/21/2022   Procedure: CARDIOVERSION;  Surgeon: Sueanne Margarita, MD;  Location: [place];  Service: Cardiovascular;  Laterality: N/A;   CHOLECYSTECTOMY     ROBOTIC ASSISTED TOTAL HYSTERECTOMY WITH BILATERAL SALPINGO OOPHERECTOMY  02/13/2015   by Dr. Polly Cobia   TEE WITHOUT CARDIOVERSION N/A 04/21/2022   Procedure: TRANSESOPHAGEAL ECHOCARDIOGRAM (TEE);  Surgeon: Sueanne Margarita, MD;  Location: [facility] ENDOSCOPY;  Service: Cardiovascular;  Laterality: N/A;    Allergies  No Known Allergies  History of Present Illness    Gina Gala T. Criger has a PMH of coronary artery disease and atrial fibrillation.  She was admitted from the emergency department on 04/18/2022 and discharged on 04/22/2022.  She underwent TEE/DCCV on 04/21/2022.  She was converted to sinus rhythm.  Her echocardiogram showed severely dilated left atria, mitral valve regurgitation with mild-moderate MAC and trivial mitral regurgitation.  She was also noted to have aortic valve stenosis.  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  She presents to the clinic today for follow-up evaluation and states she has been taking it easy since her cardioversion.  She reports compliance with her apixaban and denies bleeding issues.  She presents with her husband and daughter.  We reviewed her decreased  pumping function and TEE/DCCV.  They expressed understanding.  I will start losartan, order BMP today and in 2 weeks and plan follow-up in 1 month.  Today she denies chest pain, shortness of breath, lower extremity edema, fatigue, palpitations, melena, hematuria, hemoptysis, diaphoresis, weakness, presyncope, syncope, orthopnea, and PND.     Home Medications    Prior to Admission medications   Medication Sig Start Date End Date Taking? Authorizing Provider  acetaminophen (TYLENOL) 325 MG tablet Take 325 mg by mouth daily as needed for mild pain or headache.    [provider]  apixaban (ELIQUIS) 5 MG TABS tablet Take 1 tablet (5 mg total) by mouth 2 (two) times daily. 04/22/22   Angelique Blonder, DO  aspirin 81 MG EC tablet Take 81 mg by mouth in the morning.    [provider]  digoxin (LANOXIN) 0.125 MG tablet Take 1 tablet (0.125 mg total) by mouth daily. 04/23/22   Angelique Blonder, DO  magnesium oxide (MAG-OX) 400 MG tablet Take by mouth every other day. 04/22/22   Angelique Blonder, DO  metoprolol tartrate (LOPRESSOR) 25 MG tablet Take 1 tablet (25 mg total) by mouth 2 (two) times daily. 04/22/22 05/22/22  Angelique Blonder, DO  omeprazole (PRILOSEC OTC) 20 MG tablet Take 20 mg by mouth daily as needed (for heartburn).    [provider]  rosuvastatin (CRESTOR) 5 MG tablet Take 1 tablet (5 mg total) by mouth daily. 04/23/22   Angelique Blonder, DO    Family History    Family History  Problem Relation Age of Onset  Diabetes Mother    Peripheral vascular disease Mother    Stroke Father    Breast cancer Daughter    She indicated that her mother is deceased. She indicated that her father is deceased. She indicated that the status of her daughter is unknown.  Social History    Social History   Socioeconomic History   Marital status: Married    Spouse name: Not on file   Number of children: 2   Years of education: Not on file   Highest education level: Not on file   Occupational History   Not on file  Tobacco Use   Smoking status: Never   Smokeless tobacco: Never  Substance and Sexual Activity   Alcohol use: No    Alcohol/week: 0.0 standard drinks of alcohol   Drug use: No   Sexual activity: Not on file  Other Topics Concern   Not on file  Social History Narrative   Married, 2 children and 8 grand and 5 great   Social Determinants of Health   Financial Resource Strain: Not on file  Food Insecurity: Not on file  Transportation Needs: Not on file  Physical Activity: Not on file  Stress: Not on file  Social Connections: Not on file  Intimate Partner Violence: Not on file     Review of Systems    General:  No chills, fever, night sweats or weight changes.  Cardiovascular:  No chest pain, dyspnea on exertion, edema, orthopnea, palpitations, paroxysmal nocturnal dyspnea. Dermatological: No rash, lesions/masses Respiratory: No cough, dyspnea Urologic: No hematuria, dysuria Abdominal:   No nausea, vomiting, diarrhea, bright red blood per rectum, melena, or hematemesis Neurologic:  No visual changes, wkns, changes in mental status. All other systems reviewed and are otherwise negative except as noted above.  Physical Exam    VS:  BP 128/76   Pulse 80   Ht _0  (1.651 m)   Wt 133 lb 9.6 oz (60.6 kg)   SpO2 95%   BMI 22.23 kg/m  , BMI Body mass index is 22.23 kg/m. GEN: Well nourished, well developed, in no acute distress. HEENT: normal. Neck: Supple, no JVD, carotid bruits, or masses. Cardiac: RRR, no murmurs, rubs, or gallops. No clubbing, cyanosis, edema.  Radials/DP/PT 2+ and equal bilaterally.  Respiratory:  Respirations regular and unlabored, clear to auscultation bilaterally. GI: Soft, nontender, nondistended, BS + x 4. MS: no deformity or atrophy. Skin: warm and dry, no rash. Neuro:  Strength and sensation are intact. Psych: Normal affect.  Accessory Clinical Findings    Recent Labs: 04/18/2022: ALT 15; TSH  3.122 04/19/2022: Hemoglobin 12.0; Platelets 334 04/21/2022: BUN 11; Creatinine, Ser 0.87; Potassium 4.1; Sodium 139 04/22/2022: Magnesium 1.6   Recent Lipid Panel    Component Value Date/Time   CHOL 80 04/18/2022 1716   TRIG 113 04/18/2022 1716   HDL 36 (L) 04/18/2022 1716   CHOLHDL 2.2 04/18/2022 1716   VLDL 23 04/18/2022 1716   LDLCALC 21 04/18/2022 1716         ECG personally reviewed by me today-normal sinus rhythm biatrial enlargement 80 bpm- No acute changes  Echocardiogram 04/19/2022  IMPRESSIONS     1. Technically difficult study due to poor windows and in Afib with RVR.  Left ventricular ejection fraction, by estimation, is 40 to 45%. The left  ventricle has mildly decreased function. The left ventricle demonstrates  global hypokinesis. There is mild   left ventricular hypertrophy. Left ventricular diastolic parameters are  indeterminate.   2. Right  ventricular systolic function is mildly reduced. The right  ventricular size is normal. There is moderately elevated pulmonary artery  systolic pressure. The estimated right ventricular systolic pressure is  65.4 mmHg.   3. Left atrial size was mildly dilated.   4. The mitral valve is degenerative. Trivial mitral valve regurgitation.  Moderate mitral annular calcification.   5. Tricuspid valve regurgitation is mild to moderate.   6. The aortic valve was not well visualized. Aortic valve regurgitation  is not visualized. Aortic valve sclerosis/calcification is present,  without any evidence of aortic stenosis.   7. The inferior vena cava is normal in size with greater than 50%  respiratory variability, suggesting right atrial pressure of 3 mmHg.    Assessment & Plan   1.  Atrial fibrillation-EKG today shows normal sinus rhythm biatrial enlargement 80 bpm.  Reports compliance with apixaban and denies bleeding issues.  CHA2DS2-VASc score 5. Continue apixaban, metoprolol, digoxin Heart healthy low-sodium diet-salty 6  given Increase physical activity as tolerated  Acute systolic CHF-TEE showed improved EF.  TTE showed an EF of 40-45%.  No increased DOE or activity intolerance.  No signs of heart failure. Continue metoprolol, digoxin Heart healthy low-sodium diet-salty 6 given Increase physical activity as tolerated Start losartan 25 mg daily Repeat BMP today and in 2 weeks  Hyperlipidemia-LDL 21.  Recent weight loss of around 75 pounds. Continue current diet and increase physical activity Continue rosuvastatin  Disposition: Follow-up with Dr. Percival Spanish in [place].   Jossie Ng. Zellie Jenning NP-C     04/29/2022, 11:04 AM [address] Office [phone] Fax [phone]  Notice: This dictation was prepared with Dragon dictation along with smaller phrase technology. Any transcriptional errors that result from this process are unintentional and may not be corrected upon review.  I spent 15 minutes examining this patient, reviewing medications, and using patient centered shared decision making involving her cardiac care.  Prior to her visit I spent greater than 20 minutes reviewing her past medical history,  medications, and prior cardiac tests.

## 2022-04-29 ENCOUNTER — Encounter: Payer: Self-pay | Admitting: General Practice

## 2022-04-29 ENCOUNTER — Ambulatory Visit: Payer: Medicare PPO | Attending: General Practice | Admitting: General Practice

## 2022-04-29 VITALS — BP 128/76 | HR 80 | Ht 65.0 in | Wt 133.6 lb

## 2022-04-29 DIAGNOSIS — I4891 Unspecified atrial fibrillation: Secondary | ICD-10-CM | POA: Diagnosis not present

## 2022-04-29 DIAGNOSIS — E782 Mixed hyperlipidemia: Secondary | ICD-10-CM | POA: Diagnosis not present

## 2022-04-29 DIAGNOSIS — I5021 Acute systolic (congestive) heart failure: Secondary | ICD-10-CM

## 2022-04-29 LAB — BASIC METABOLIC PANEL
BUN/Creatinine Ratio: 22 (ref 12–28)
BUN: 17 mg/dL (ref 8–27)
CO2: 33 mmol/L — ABNORMAL HIGH (ref 20–29)
Calcium: 9.8 mg/dL (ref 8.7–10.3)
Chloride: 96 mmol/L (ref 96–106)
Creatinine, Ser: 0.78 mg/dL (ref 0.57–1.00)
Glucose: 113 mg/dL — ABNORMAL HIGH (ref 70–99)
Potassium: 4.8 mmol/L (ref 3.5–5.2)
Sodium: 142 mmol/L (ref 134–144)
eGFR: 78 mL/min/{1.73_m2} (ref >59)

## 2022-04-29 MED ORDER — DIGOXIN 125 MCG PO TABS: 0.1250 mg | ORAL_TABLET | Freq: Every day | ORAL | 3 refills | Status: DC

## 2022-04-29 MED ORDER — METOPROLOL TARTRATE 25 MG PO TABS: 25.0000 mg | ORAL_TABLET | Freq: Two times a day (BID) | ORAL | 3 refills | Status: DC

## 2022-04-29 MED ORDER — LOSARTAN POTASSIUM 25 MG PO TABS: 25.0000 mg | ORAL_TABLET | Freq: Every day | ORAL | 3 refills | Status: DC

## 2022-04-29 MED ORDER — ROSUVASTATIN CALCIUM 5 MG PO TABS: 5.0000 mg | ORAL_TABLET | Freq: Every day | ORAL | 3 refills | Status: DC

## 2022-04-29 MED ORDER — APIXABAN 5 MG PO TABS: 5.0000 mg | ORAL_TABLET | Freq: Two times a day (BID) | ORAL | 3 refills | Status: DC

## 2022-04-29 NOTE — Patient Instructions (Signed)
Medication Instructions:  START LOSARTAN '25MG'$  DAILY  *If you need a refill on your cardiac medications before your next appointment, please call your pharmacy*  Lab Work:     Testing/Procedures:  BMET TODAY AND IN 3 WEEKS  NONE  If you have labs (blood work) drawn today and your tests are completely normal, you will receive your results only by:  1-MyChart Message (if you have MyChart) OR 2- A paper copy in the mail.  If you have any lab test that is abnormal or we need to change your treatment, we will call you to review the results.  You may also go to any of these [facility] locations:  [place] [address],  [place] [address] ([facility]) [address]- [address]  [place] [address] [employer] [facility])  Special Instructions PLEASE READ AND FOLLOW SALTY 6-ATTACHED-1,'800mg'$  daily  PLEASE INCREASE PHYSICAL ACTIVITY AS TOLERATED   Follow-Up: Your next appointment:  1-2 month(s) In Person with Minus Breeding, MD IN [place] OFFICE :1  At [facility], you and your health needs are our priority.  As part of our continuing mission to provide you with exceptional heart care, we have created designated Provider Care Teams.  These Care Teams include your primary Cardiologist (physician) and Advanced Practice Providers (APPs -  Physician Assistants and Nurse Practitioners) who all work together to provide you with the care you need, when you need it.  We recommend signing up for the patient portal called "MyChart".  Sign up information is provided on this After Visit Summary.  MyChart is used to connect with patients for Virtual Visits (Telemedicine).  Patients are able to view lab/test results, encounter notes, upcoming appointments, etc.   Non-urgent messages can be sent to your provider as well.   To learn more about what you can do with MyChart, go to [email or web address].    Important Information About Sugar             6 SALTY THINGS TO AVOID     1,'800MG'$  DAILY

## 2022-04-29 NOTE — Addendum Note (Signed)
Addended by: Waylan Rocher on: 04/29/2022 11:17 AM   Modules accepted: Orders

## 2022-05-19 ENCOUNTER — Telehealth: Payer: Self-pay | Admitting: Cardiology

## 2022-05-19 NOTE — Telephone Encounter (Signed)
Pt c/o medication issue:  1. Name of Medication: magnesium oxide (MAG-OX) 400 MG tablet  rosuvastatin (CRESTOR) 5 MG tablet  2. How are you currently taking this medication (dosage and times per day)?    3. Are you having a reaction (difficulty breathing--STAT)? no  4. What is your medication issue? Calling with question concerns the medication. Please advise

## 2022-05-19 NOTE — Telephone Encounter (Signed)
Spoke with pt regarding rosuvastatin and magnesium oxide. Pt wanted to know if she should continue taking these medications. Explained to pt that she should continue take her rosuvastatin. Pt was given magnesium oxide '400mg'$  every other day when she was recnetly seen in the hospital ED for a-fib. Mag levels were drawn at this visit and came back 1.6. Pt would like to know if she should continue to take this medication? Will forward to provider to advise.

## 2022-05-20 NOTE — Telephone Encounter (Signed)
Spoke with pt, aware of dr hochrein's recommendations. ?

## 2022-06-10 DIAGNOSIS — I5021 Acute systolic (congestive) heart failure: Secondary | ICD-10-CM | POA: Insufficient documentation

## 2022-06-10 DIAGNOSIS — I480 Paroxysmal atrial fibrillation: Secondary | ICD-10-CM | POA: Insufficient documentation

## 2022-06-10 NOTE — Progress Notes (Unsigned)
Cardiology Office Note   Date:  06/11/2022   ID:  Gina Duncan, Nazareno 02-Oct-1940, MRN [id number]  PCP:  Pcp, No  Cardiologist:   None   Chief Complaint  Patient presents with   Atrial Fibrillation      History of Present Illness: Gina Duncan is a 81 y.o. female who presents for follow up of coronary artery disease and atrial fibrillation.  She was admitted from the emergency department on 04/18/2022 and discharged on 04/22/2022.  She underwent TEE/DCCV on 04/21/2022.  She was converted to sinus rhythm.  Her echocardiogram showed severely dilated left atria, mitral valve regurgitation with mild-moderate MAC and trivial mitral regurgitation.  She was also noted to have aortic valve stenosis.  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   She presents to the clinic today for follow-up.  She has done well since she was seen.  She has had no symptomatic recurrent fibrillation.  When she was seen in follow-up in our clinic she did have losartan added because she had a mildly reduced ejection fraction some elevated blood pressures.  She has done well with this.  Her blood pressure is well controlled.  Her heart rates have been in the 50s and 60s. The patient denies any new symptoms such as chest discomfort, neck or arm discomfort. There has been no new shortness of breath, PND or orthopnea. There have been no reported palpitations, presyncope or syncope.    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 Polly Cobia   TEE WITHOUT CARDIOVERSION N/A 04/21/2022   Procedure: TRANSESOPHAGEAL ECHOCARDIOGRAM (TEE);  Surgeon: Sueanne Margarita, MD;  Location: [facility] ENDOSCOPY;  Service: Cardiovascular;  Laterality: N/A;     Current Outpatient Medications  Medication Sig Dispense Refill   acetaminophen (TYLENOL) 325 MG tablet Take 325 mg by mouth daily as needed for mild pain or headache.     apixaban (ELIQUIS) 5 MG TABS tablet Take 1 tablet (5 mg total) by mouth 2 (two) times daily. 180 tablet 3   digoxin (LANOXIN) 0.125 MG tablet Take 1 tablet (0.125 mg total) by mouth daily. 90 tablet 3   losartan (COZAAR) 25 MG tablet Take 1 tablet (25 mg total) by mouth daily. 30 tablet 3   magnesium oxide (MAG-OX) 400 MG tablet Take by mouth every other day. 30 tablet 0   metoprolol tartrate (LOPRESSOR) 25 MG tablet Take 1 tablet (25 mg total) by mouth 2 (two) times daily. 90 tablet 3   omeprazole (PRILOSEC OTC) 20 MG tablet Take 20 mg by mouth daily as needed (for heartburn).     rosuvastatin (CRESTOR) 5 MG tablet Take 1 tablet (5 mg total) by mouth daily. 90 tablet 3   No current facility-administered medications for this visit.    Allergies:   Patient has no known allergies.    ROS:  Please see the history of present illness.   Otherwise, review of  systems are positive for none.   All other systems are reviewed and negative.    PHYSICAL EXAM: VS:  BP (!) 170/96   Pulse 84   Ht _0  (1.651 m)   Wt 130 lb (59 kg)   BMI 21.63 kg/m  , BMI Body mass index is 21.63 kg/m. GENERAL:  Well appearing NECK:  No jugular venous distention, waveform within normal limits, carotid upstroke brisk and symmetric, no bruits, no thyromegaly LUNGS:  Clear to auscultation bilaterally CHEST:  Unremarkable HEART:  PMI not displaced or sustained,S1 and S2 within normal limits, no S3, no S4, no clicks, no rubs, 2 out of 6 apical systolic murmur radiating slightly at aortic  outflow tract, no diastolic murmurs ABD:  Flat, positive bowel sounds normal in frequency in pitch, no bruits, no rebound, no guarding, no midline pulsatile mass, no hepatomegaly, no splenomegaly EXT:  2 plus pulses throughout, no edema, no cyanosis no clubbing    EKG:  EKG is not ordered today. NA   Recent Labs: 04/18/2022: ALT 15; TSH 3.122 04/19/2022: Hemoglobin 12.0; Platelets 334 04/22/2022: Magnesium 1.6 04/29/2022: BUN 17; Creatinine, Ser 0.78; Potassium 4.8; Sodium 142    Lipid Panel    Component Value Date/Time   CHOL 80 04/18/2022 1716   TRIG 113 04/18/2022 1716   HDL 36 (L) 04/18/2022 1716   CHOLHDL 2.2 04/18/2022 1716   VLDL 23 04/18/2022 1716   LDLCALC 21 04/18/2022 1716      Wt Readings from Last 3 Encounters:  06/11/22 130 lb (59 kg)  04/29/22 133 lb 9.6 oz (60.6 kg)  04/22/22 137 lb 6.4 oz (62.3 kg)      Other studies Reviewed: Additional studies/ records that were reviewed today include: Labs, hospital records. Review of the above records demonstrates:  Please see elsewhere in the note.     ASSESSMENT AND PLAN:   Atrial fibrillation    CHA2DS2-VASc score 5.  She is tolerating anticoagulation.  Has had no symptomatic recurrence.  I am going to continue the meds as listed.  Her digoxin is very minimally elevated above 1.  I am going to have her increase her magnesium to daily instead of 3 times a week because her magnesium level was slightly low.  No change in therapy.  Acute systolic CHF-TEE showed improved EF.  TTE showed an EF of 40-45%.  I will repeat an echocardiogram in the future and see her in 6 months.  No change in therapy.    Hyperlipidemia:  LDL was remarkably low.  I would like to repeat this as she had significant atheroma.  For now she will continue the low-dose of statin.  I will also check an LP(a) level.  Hypertension: Her blood pressure is very elevated today but blood pressure reading demonstrates it to be very normal at home.  No  change in therapy.   Current medicines are reviewed at length with the patient today.  The patient has concerns regarding medicines.  The following changes have been made:  no change  Labs/ tests ordered today include: None  Orders Placed This Encounter  Procedures   Lipoprotein A (LPA)   Lipid panel     Disposition:   FU with me in six months.     Signed, Minus Breeding, MD  06/11/2022 1:44 PM    [place]

## 2022-06-11 ENCOUNTER — Ambulatory Visit (INDEPENDENT_AMBULATORY_CARE_PROVIDER_SITE_OTHER): Payer: Medicare PPO | Admitting: Cardiology

## 2022-06-11 ENCOUNTER — Encounter: Payer: Self-pay | Admitting: Cardiology

## 2022-06-11 ENCOUNTER — Other Ambulatory Visit: Payer: Self-pay | Admitting: *Deleted

## 2022-06-11 VITALS — BP 170/96 | HR 84 | Ht 65.0 in | Wt 130.0 lb

## 2022-06-11 DIAGNOSIS — Z79899 Other long term (current) drug therapy: Secondary | ICD-10-CM

## 2022-06-11 DIAGNOSIS — I5021 Acute systolic (congestive) heart failure: Secondary | ICD-10-CM | POA: Diagnosis not present

## 2022-06-11 DIAGNOSIS — I480 Paroxysmal atrial fibrillation: Secondary | ICD-10-CM

## 2022-06-11 DIAGNOSIS — E785 Hyperlipidemia, unspecified: Secondary | ICD-10-CM

## 2022-06-11 MED ORDER — MAGNESIUM OXIDE 400 MG PO TABS: 400.0000 mg | ORAL_TABLET | Freq: Every day | ORAL | 6 refills | Status: DC

## 2022-06-11 NOTE — Addendum Note (Signed)
Addended by: Shellia Cleverly on: 06/11/2022 05:09 PM   Modules accepted: Orders

## 2022-06-11 NOTE — Patient Instructions (Signed)
Medication Instructions:  Please continue your current medications as listed.  *If you need a refill on your cardiac medications before your next appointment, please call your pharmacy*  Lab Work: Please have bloodwork at your closest [place] (Lipid, LPa)  If you have labs (blood work) drawn today and your tests are completely normal, you will receive your results only by: [place] (if you have MyChart) OR A paper copy in the mail If you have any lab test that is abnormal or we need to change your treatment, we will call you to review the results.  Follow-Up: At [facility], you and your health needs are our priority.  As part of our continuing mission to provide you with exceptional heart care, we have created designated Provider Care Teams.  These Care Teams include your primary Cardiologist (physician) and Advanced Practice Providers (APPs -  Physician Assistants and Nurse Practitioners) who all work together to provide you with the care you need, when you need it.  We recommend signing up for the patient portal called "MyChart".  Sign up information is provided on this After Visit Summary.  MyChart is used to connect with patients for Virtual Visits (Telemedicine).  Patients are able to view lab/test results, encounter notes, upcoming appointments, etc.  Non-urgent messages can be sent to your provider as well.   To learn more about what you can do with MyChart, go to [email or web address].    Your next appointment:   6 month(s)  The format for your next appointment:   In Person  Provider:   Minus Breeding, MD     Dr Heath Lark [phone]  Important Information About Sugar

## 2022-06-12 ENCOUNTER — Telehealth: Payer: Self-pay | Admitting: Cardiology

## 2022-06-12 NOTE — Telephone Encounter (Signed)
Pt c/o medication issue:  1. Name of Medication: Magnesium glycinate  2. How are you currently taking this medication (dosage and times per day)? Not currently taking   3. Are you having a reaction (difficulty breathing--STAT)? No   4. What is your medication issue? Patient is calling requesting to speak with Pam regarding further information on this magnesium. She is wanting to discuss what name brand and MG's with her. Please advise.

## 2022-06-12 NOTE — Telephone Encounter (Signed)
Patient wanted to know  what type of magnesium tablet Dr Percival Spanish wanted her to purchase.  She states she has the Magnesium oxide - she had been taking every other day.  Dr Percival Spanish ,from yesterday visit, wanted her to take the medication everyday. Patient states they wrote down  "Magnesium  Glycinate" but did not say the dosage.  She would like more direction on dosage.

## 2022-06-13 NOTE — Telephone Encounter (Signed)
Out of all the magnesium supplements available on the market, many consider magnesium glycinate to be one of the most tolerated forms of magnesium supplementation. "it is one of the most absorbable forms and least likely to cause digestive or laxative effects," explains Gina Duncan, a Pharmacist, community. "The glycine is what makes this form of magnesium more absorbable with less digestive effects."

## 2022-06-13 NOTE — Telephone Encounter (Signed)
Attempted to contact pt to discuss her questions regarding Magnesium Glycinate supplementation.  Left message for pt as long as the supplement is coming from a reputable store (pharmacy, [facility] etc) there isn't a specific brand name.  Also advised normal dose would be 300 mg per day but if unable to find 300 mg tabs, any from 300 to 420 mg daily would be fine.  Requested she call back with any further questions or concerns.

## 2022-08-11 ENCOUNTER — Other Ambulatory Visit: Payer: Self-pay | Admitting: General Practice

## 2022-11-10 ENCOUNTER — Other Ambulatory Visit: Payer: Self-pay | Admitting: General Practice

## 2022-11-11 NOTE — Telephone Encounter (Signed)
Please make a follow-up appt for future refills. Thanks 1st attempt

## 2022-12-02 ENCOUNTER — Telehealth: Payer: Self-pay | Admitting: Cardiology

## 2022-12-02 NOTE — Telephone Encounter (Signed)
Pt c/o medication issue:  1. Name of Medication: Doxycycline  2. How are you currently taking this medication (dosage and times per day)?  2 times a day   3. Are you having a reaction (difficulty breathing--STAT)?   4. What is your medication issue? She wanted to check with Dr Percival Spanish to see if this is alright for her to take along with her other medicine

## 2022-12-02 NOTE — Telephone Encounter (Signed)
Forwarded to pharmD to review 

## 2022-12-03 ENCOUNTER — Ambulatory Visit: Payer: Medicare PPO | Admitting: Cardiology

## 2022-12-03 ENCOUNTER — Encounter: Payer: Self-pay | Admitting: Cardiology

## 2022-12-03 ENCOUNTER — Other Ambulatory Visit: Payer: Self-pay | Admitting: *Deleted

## 2022-12-03 VITALS — BP 158/90 | HR 85 | Ht 65.0 in | Wt 134.0 lb

## 2022-12-03 DIAGNOSIS — I5021 Acute systolic (congestive) heart failure: Secondary | ICD-10-CM | POA: Diagnosis not present

## 2022-12-03 DIAGNOSIS — Z79899 Other long term (current) drug therapy: Secondary | ICD-10-CM

## 2022-12-03 DIAGNOSIS — E785 Hyperlipidemia, unspecified: Secondary | ICD-10-CM

## 2022-12-03 DIAGNOSIS — I480 Paroxysmal atrial fibrillation: Secondary | ICD-10-CM

## 2022-12-03 NOTE — Telephone Encounter (Signed)
Can increase concentrations of digoxin but last level was normal and short term doxycycline use should be ok.

## 2022-12-03 NOTE — Telephone Encounter (Signed)
Left Voicemail to call office.

## 2022-12-03 NOTE — Patient Instructions (Signed)
Medication Instructions:  Please discontinue your Digoxin. Continue all other medications as listed.  *If you need a refill on your cardiac medications before your next appointment, please call your pharmacy*   Lab Work: Please have blood work in August (Lipid).  If you have labs (blood work) drawn today and your tests are completely normal, you will receive your results only by: [place] (if you have MyChart) OR A paper copy in the mail If you have any lab test that is abnormal or we need to change your treatment, we will call you to review the results.   Testing/Procedures: Your physician has requested that you have an echocardiogram. Echocardiography is a painless test that uses sound waves to create images of your heart. It provides your doctor with information about the size and shape of your heart and how well your heart's chambers and valves are working. This procedure takes approximately one hour. There are no restrictions for this procedure. Please do NOT wear cologne, perfume, aftershave, or lotions (deodorant is allowed). Please arrive 15 minutes prior to your appointment time. This will be completed at [facility].  You will be contacted to be scheduled.   Follow-Up: At [facility], you and your health needs are our priority.  As part of our continuing mission to provide you with exceptional heart care, we have created designated Provider Care Teams.  These Care Teams include your primary Cardiologist (physician) and Advanced Practice Providers (APPs -  Physician Assistants and Nurse Practitioners) who all work together to provide you with the care you need, when you need it.  We recommend signing up for the patient portal called "MyChart".  Sign up information is provided on this After Visit Summary.  MyChart is used to connect with patients for Virtual Visits (Telemedicine).  Patients are able to view lab/test results, encounter notes, upcoming appointments,  etc.  Non-urgent messages can be sent to your provider as well.   To learn more about what you can do with MyChart, go to [email or web address].    Your next appointment:   6 month(s)  Provider:   Minus Breeding, MD

## 2022-12-03 NOTE — Progress Notes (Signed)
Cardiology Office Note:   Date:  12/03/2022  ID:  Gina Duncan, Gina Duncan 02-01-1941, MRN [id number]  History of Present Illness:   Gina Duncan is a 82 y.o. female who presents for follow up of coronary artery disease and atrial fibrillation.  She was admitted from the emergency department on 04/18/2022 and discharged on 04/22/2022.  She underwent TEE/DCCV on 04/21/2022.  She was converted to sinus rhythm.  Her echocardiogram showed severely dilated left atria, mitral valve regurgitation with mild-moderate MAC and trivial mitral regurgitation.  She was also noted to have aortic valve stenosis.  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  [facility] DES stenting to her mid LAD in 2017 MRI.  Since I last saw her she had no new cardiovascular complications.  She does not have any palpitations, presyncope or syncope.  She does not have shortness of breath, PND or orthopnea.  She has no chest pressure, neck or arm discomfort.  She has had no weight gain or edema.  He did have to go to the hospital yesterday because she is having a systolic blood.  I cannot see those records.  She has a swollen right foot that she might of hit.  There was no fracture by the report.  Uric acid level was normal.  She has been treated with doxycycline.  She called today to see if that medicine is okay.  ROS: As stated in the HPI and negative for all other systems.  Studies Reviewed:    EKG: Sinus rhythm, rate 85, axis within normal limits, intervals within normal limits, inferolateral T wave changes consistent with probably digoxin effect and unchanged from previous, left atrial enlargement    Risk Assessment/Calculations:   Ms. Gina Duncan has a CHA2DS2 - VASc score of 5.     Physical Exam:   VS:  BP (!) 158/90   Pulse 85   Ht 5\' 5"  (1.651 m)   Wt 134 lb (60.8 kg)   BMI 22.30 kg/m    Wt Readings from Last 3 Encounters:  12/03/22 134 lb (60.8  kg)  06/11/22 130 lb (59 kg)  04/29/22 133 lb 9.6 oz (60.6 kg)     GEN: Well nourished, well developed in no acute distress NECK: No JVD; No carotid bruits CARDIAC: Soft apical systolic murmur radiating slightly at the aortic outflow tract, regular rate and rhythm, no clicks, rubs or gallops.   RESPIRATORY:  Clear to auscultation without rales, wheezing or rhonchi  ABDOMEN: Soft, non-tender, non-distended EXTREMITIES:  No edema; swelling and tenderness with slight bruising from predominantly dorsum of the right foot, 2+ pulses throughout  ASSESSMENT AND PLAN:   Atrial fibrillation    CHA2DS2-VASc score 5.  She is tolerating anticoagulation.  No change in therapy.  She can stop her digoxin   Acute systolic CHF-TEE showed improved EF.  TTE showed an EF of 40-45%.  However, it was better when she had her TEE.  Underwent repeat echocardiogram in August.    Hyperlipidemia:  LDL was remarkably low.  I will repeat an LP(a) and lipid profile in August.  Exam  Hypertension: Her blood pressure is elevated today but she says that it usually nicely controlled.  She takes it at home.  No change in therapy.  CAD: She had no new chest pain.  No change in therapy.  We will continue with risk reduction for this and her aortic atherosclerosis with significant atheroma.  Signed, Minus Breeding, MD

## 2022-12-04 NOTE — Telephone Encounter (Signed)
Called to patient and she states she saw the doctor yesterday and was taken off digoxin.  She does thank [place] for checking on her.

## 2022-12-16 ENCOUNTER — Other Ambulatory Visit: Payer: Self-pay | Admitting: General Practice

## 2023-02-04 ENCOUNTER — Ambulatory Visit: Payer: Medicare PPO | Admitting: Cardiology

## 2023-04-08 ENCOUNTER — Ambulatory Visit (HOSPITAL_COMMUNITY)
Admission: RE | Admit: 2023-04-08 | Discharge: 2023-04-08 | Disposition: A | Discharge status: Home / Self Care | Payer: Medicare PPO | Source: Ambulatory Visit | Attending: Cardiology | Admitting: Cardiology

## 2023-04-08 DIAGNOSIS — I480 Paroxysmal atrial fibrillation: Secondary | ICD-10-CM | POA: Diagnosis not present

## 2023-04-08 DIAGNOSIS — I5021 Acute systolic (congestive) heart failure: Secondary | ICD-10-CM

## 2023-04-08 LAB — ECHOCARDIOGRAM COMPLETE
Area-P 1/2: 3.68 cm2
MV M vel: 3.68 m/s
MV Peak grad: 54.2 mmHg
MV VTI: 0.9 cm2
S' Lateral: 2.8 cm

## 2023-04-08 NOTE — Progress Notes (Signed)
  Echocardiogram 2D Echocardiogram has been performed.  Maren Reamer 04/08/2023, 1:54 PM

## 2023-04-16 ENCOUNTER — Encounter: Payer: Self-pay | Admitting: *Deleted

## 2023-05-12 ENCOUNTER — Other Ambulatory Visit: Payer: Self-pay | Admitting: General Practice

## 2023-08-04 ENCOUNTER — Other Ambulatory Visit: Payer: Self-pay | Admitting: Cardiology

## 2023-09-06 NOTE — Progress Notes (Signed)
  Cardiology Office Note:   Date:  09/09/2023  ID:  Gina Duncan, Birkel Oct 25, 1940, MRN [id number] PCP: [name], MD  [facility] Providers Cardiologist:  None {  History of Present Illness:   Gina DASEN. Gina Duncan is a 83 y.o. female who presents for follow up of coronary artery disease and atrial fibrillation.  She was admitted from the emergency department on 04/18/2022 and discharged on 04/22/2022.  She underwent TEE/DCCV on 04/21/2022.  She was converted to sinus rhythm.  Her echocardiogram showed severely dilated left atria, mitral valve regurgitation with mild-moderate MAC and trivial mitral regurgitation.  She was also noted to have aortic valve stenosis.  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  [facility] and DES stenting to her mid LAD in 2017.     Since I last saw her had another echo in August 2024.  This demonstrated that the ejection fraction was 60 to 65%.  There were no significant valvular abnormalities.  ROS: As stated in the HPI and negative for all other systems.  Studies Reviewed:    EKG:   NA  Risk Assessment/Calculations:    CHA2DS2-VASc Score = 6   This indicates a 9.7% annual risk of stroke. The patient's score is based upon: CHF History: 1 HTN History: 1 Diabetes History: 0 Stroke History: 0 Vascular Disease History: 1 Age Score: 2 Gender Score: 1    Physical Exam:   VS:  BP (!) 158/90   Pulse 76   Ht 5' 5 (1.651 m)   Wt 133 lb (60.3 kg)   BMI 22.13 kg/m    Wt Readings from Last 3 Encounters:  09/09/23 133 lb (60.3 kg)  12/03/22 134 lb (60.8 kg)  06/11/22 130 lb (59 kg)     GEN: Well nourished, well developed in no acute distress NECK: No JVD; No carotid bruits CARDIAC: RRR, no murmurs, rubs, gallops RESPIRATORY:  Clear to auscultation without rales, wheezing or rhonchi  ABDOMEN: Soft, non-tender, non-distended EXTREMITIES:  No edema; No deformity   ASSESSMENT AND  PLAN:   Atrial fibrillation    The patient has no new sypmtoms.  No further cardiovascular testing is indicated.  We will continue with aggressive risk reduction and meds as listed.  Acute systolic CHF:   Her ejection fraction is now back to normal.  No change in therapy.  Hyperlipidemia:  LDL was 21.  She said she recently had this followed by her primary provider as well and that it was okay.  The goal would be an LDL in the 50s.    Hypertension:   Her blood pressure is elevated here today but she says it is in the 120s at home.  They are going to keep a blood pressure diary and I will titrate her meds if it is not at target.    CAD:   The patient has no new sypmtoms.  No further cardiovascular testing is indicated.  We will continue with aggressive risk reduction and meds as listed.     Follow up with me in one year.   Signed, Lynwood Schilling, MD

## 2023-09-09 ENCOUNTER — Encounter: Payer: Self-pay | Admitting: Cardiology

## 2023-09-09 ENCOUNTER — Ambulatory Visit: Payer: Medicare PPO | Admitting: Cardiology

## 2023-09-09 VITALS — BP 158/90 | HR 76 | Ht 65.0 in | Wt 133.0 lb

## 2023-09-09 DIAGNOSIS — I10 Essential (primary) hypertension: Secondary | ICD-10-CM | POA: Diagnosis not present

## 2023-09-09 DIAGNOSIS — I4891 Unspecified atrial fibrillation: Secondary | ICD-10-CM

## 2023-09-09 DIAGNOSIS — I482 Chronic atrial fibrillation, unspecified: Secondary | ICD-10-CM

## 2023-09-09 DIAGNOSIS — I2510 Atherosclerotic heart disease of native coronary artery without angina pectoris: Secondary | ICD-10-CM

## 2023-09-09 DIAGNOSIS — E785 Hyperlipidemia, unspecified: Secondary | ICD-10-CM | POA: Diagnosis not present

## 2023-09-09 NOTE — Patient Instructions (Signed)

## 2023-10-29 ENCOUNTER — Other Ambulatory Visit: Payer: Self-pay

## 2023-10-29 MED ORDER — METOPROLOL TARTRATE 25 MG PO TABS: 25.0000 mg | ORAL_TABLET | Freq: Two times a day (BID) | ORAL | 11 refills | Status: DC

## 2024-01-26 ENCOUNTER — Other Ambulatory Visit: Payer: Self-pay | Admitting: Cardiology

## 2024-02-24 ENCOUNTER — Other Ambulatory Visit: Payer: Self-pay

## 2024-02-24 MED ORDER — APIXABAN 5 MG PO TABS: 5.0000 mg | ORAL_TABLET | Freq: Two times a day (BID) | ORAL | 3 refills | Status: AC

## 2024-02-24 NOTE — Telephone Encounter (Signed)
 Prescription refill request for Eliquis  received. Indication:afib Last office visit:1/25 Scr:0.8  6/25 Age: 83 Weight:60.3  kg  Prescription refilled

## 2024-04-20 ENCOUNTER — Other Ambulatory Visit: Payer: Self-pay | Admitting: Cardiology

## 2024-06-06 ENCOUNTER — Inpatient Hospital Stay (HOSPITAL_COMMUNITY)

## 2024-06-06 ENCOUNTER — Other Ambulatory Visit: Payer: Self-pay

## 2024-06-06 ENCOUNTER — Emergency Department (HOSPITAL_COMMUNITY)

## 2024-06-06 ENCOUNTER — Encounter (HOSPITAL_COMMUNITY): Payer: Self-pay

## 2024-06-06 ENCOUNTER — Inpatient Hospital Stay (HOSPITAL_COMMUNITY)
Admission: EM | Admit: 2024-06-06 | Discharge: 2024-06-13 | DRG: 871 | Disposition: A | Discharge status: Home / Self Care | Attending: Family Medicine | Admitting: Family Medicine

## 2024-06-06 DIAGNOSIS — Z79899 Other long term (current) drug therapy: Secondary | ICD-10-CM | POA: Diagnosis not present

## 2024-06-06 DIAGNOSIS — I5022 Chronic systolic (congestive) heart failure: Secondary | ICD-10-CM | POA: Diagnosis present

## 2024-06-06 DIAGNOSIS — C779 Secondary and unspecified malignant neoplasm of lymph node, unspecified: Secondary | ICD-10-CM | POA: Diagnosis present

## 2024-06-06 DIAGNOSIS — I10 Essential (primary) hypertension: Secondary | ICD-10-CM | POA: Diagnosis present

## 2024-06-06 DIAGNOSIS — I2510 Atherosclerotic heart disease of native coronary artery without angina pectoris: Secondary | ICD-10-CM | POA: Diagnosis present

## 2024-06-06 DIAGNOSIS — Z803 Family history of malignant neoplasm of breast: Secondary | ICD-10-CM

## 2024-06-06 DIAGNOSIS — R531 Weakness: Secondary | ICD-10-CM

## 2024-06-06 DIAGNOSIS — J189 Pneumonia, unspecified organism: Secondary | ICD-10-CM | POA: Diagnosis present

## 2024-06-06 DIAGNOSIS — C7802 Secondary malignant neoplasm of left lung: Secondary | ICD-10-CM | POA: Diagnosis present

## 2024-06-06 DIAGNOSIS — Z7189 Other specified counseling: Secondary | ICD-10-CM | POA: Diagnosis not present

## 2024-06-06 DIAGNOSIS — K7689 Other specified diseases of liver: Secondary | ICD-10-CM | POA: Diagnosis not present

## 2024-06-06 DIAGNOSIS — E8809 Other disorders of plasma-protein metabolism, not elsewhere classified: Secondary | ICD-10-CM | POA: Diagnosis present

## 2024-06-06 DIAGNOSIS — R6510 Systemic inflammatory response syndrome (SIRS) of non-infectious origin without acute organ dysfunction: Principal | ICD-10-CM

## 2024-06-06 DIAGNOSIS — K219 Gastro-esophageal reflux disease without esophagitis: Secondary | ICD-10-CM | POA: Diagnosis present

## 2024-06-06 DIAGNOSIS — Z515 Encounter for palliative care: Secondary | ICD-10-CM | POA: Diagnosis not present

## 2024-06-06 DIAGNOSIS — C7801 Secondary malignant neoplasm of right lung: Secondary | ICD-10-CM | POA: Diagnosis present

## 2024-06-06 DIAGNOSIS — Z1152 Encounter for screening for COVID-19: Secondary | ICD-10-CM

## 2024-06-06 DIAGNOSIS — E785 Hyperlipidemia, unspecified: Secondary | ICD-10-CM | POA: Diagnosis present

## 2024-06-06 DIAGNOSIS — C541 Malignant neoplasm of endometrium: Secondary | ICD-10-CM | POA: Diagnosis present

## 2024-06-06 DIAGNOSIS — Z833 Family history of diabetes mellitus: Secondary | ICD-10-CM

## 2024-06-06 DIAGNOSIS — I480 Paroxysmal atrial fibrillation: Secondary | ICD-10-CM | POA: Diagnosis present

## 2024-06-06 DIAGNOSIS — R160 Hepatomegaly, not elsewhere classified: Secondary | ICD-10-CM | POA: Diagnosis not present

## 2024-06-06 DIAGNOSIS — Z7901 Long term (current) use of anticoagulants: Secondary | ICD-10-CM | POA: Diagnosis not present

## 2024-06-06 DIAGNOSIS — A419 Sepsis, unspecified organism: Secondary | ICD-10-CM | POA: Diagnosis present

## 2024-06-06 DIAGNOSIS — Z90710 Acquired absence of both cervix and uterus: Secondary | ICD-10-CM

## 2024-06-06 DIAGNOSIS — E871 Hypo-osmolality and hyponatremia: Secondary | ICD-10-CM | POA: Diagnosis present

## 2024-06-06 DIAGNOSIS — Z8249 Family history of ischemic heart disease and other diseases of the circulatory system: Secondary | ICD-10-CM | POA: Diagnosis not present

## 2024-06-06 DIAGNOSIS — Z823 Family history of stroke: Secondary | ICD-10-CM

## 2024-06-06 DIAGNOSIS — J9601 Acute respiratory failure with hypoxia: Secondary | ICD-10-CM | POA: Diagnosis not present

## 2024-06-06 DIAGNOSIS — R571 Hypovolemic shock: Secondary | ICD-10-CM | POA: Diagnosis present

## 2024-06-06 DIAGNOSIS — J40 Bronchitis, not specified as acute or chronic: Secondary | ICD-10-CM

## 2024-06-06 DIAGNOSIS — C221 Intrahepatic bile duct carcinoma: Secondary | ICD-10-CM | POA: Diagnosis present

## 2024-06-06 DIAGNOSIS — Z955 Presence of coronary angioplasty implant and graft: Secondary | ICD-10-CM

## 2024-06-06 DIAGNOSIS — I4891 Unspecified atrial fibrillation: Secondary | ICD-10-CM | POA: Diagnosis not present

## 2024-06-06 DIAGNOSIS — Z87891 Personal history of nicotine dependence: Secondary | ICD-10-CM

## 2024-06-06 DIAGNOSIS — K6389 Other specified diseases of intestine: Secondary | ICD-10-CM | POA: Diagnosis not present

## 2024-06-06 DIAGNOSIS — I2489 Other forms of acute ischemic heart disease: Secondary | ICD-10-CM | POA: Diagnosis present

## 2024-06-06 DIAGNOSIS — I110 Hypertensive heart disease with heart failure: Secondary | ICD-10-CM | POA: Diagnosis present

## 2024-06-06 DIAGNOSIS — R011 Cardiac murmur, unspecified: Secondary | ICD-10-CM | POA: Diagnosis not present

## 2024-06-06 LAB — URINALYSIS, ROUTINE W REFLEX MICROSCOPIC
Bacteria, UA: NONE SEEN
Bilirubin Urine: NEGATIVE
Glucose, UA: NEGATIVE mg/dL
Ketones, ur: 5 mg/dL — AB
Leukocytes,Ua: NEGATIVE
Nitrite: NEGATIVE
Protein, ur: NEGATIVE mg/dL
Specific Gravity, Urine: 1.004 — ABNORMAL LOW (ref 1.005–1.030)
pH: 6 (ref 5.0–8.0)

## 2024-06-06 LAB — COMPREHENSIVE METABOLIC PANEL WITH GFR
ALT: 8 U/L (ref 0–44)
AST: 21 U/L (ref 15–41)
Albumin: 4 g/dL (ref 3.5–5.0)
Alkaline Phosphatase: 87 U/L (ref 38–126)
Anion gap: 13 (ref 5–15)
BUN: 12 mg/dL (ref 8–23)
CO2: 28 mmol/L (ref 22–32)
Calcium: 9.5 mg/dL (ref 8.9–10.3)
Chloride: 94 mmol/L — ABNORMAL LOW (ref 98–111)
Creatinine, Ser: 0.81 mg/dL (ref 0.44–1.00)
GFR, Estimated: >60 mL/min (ref >60)
Glucose, Bld: 126 mg/dL — ABNORMAL HIGH (ref 70–99)
Potassium: 3.9 mmol/L (ref 3.5–5.1)
Sodium: 134 mmol/L — ABNORMAL LOW (ref 135–145)
Total Bilirubin: 2.6 mg/dL — ABNORMAL HIGH (ref 0.0–1.2)
Total Protein: 7.7 g/dL (ref 6.5–8.1)

## 2024-06-06 LAB — CBC
HCT: 40.1 % (ref 36.0–46.0)
Hemoglobin: 13.2 g/dL (ref 12.0–15.0)
MCH: 31.4 pg (ref 26.0–34.0)
MCHC: 32.9 g/dL (ref 30.0–36.0)
MCV: 95.5 fL (ref 80.0–100.0)
Platelets: 215 K/uL (ref 150–400)
RBC: 4.2 MIL/uL (ref 3.87–5.11)
RDW: 12.2 % (ref 11.5–15.5)
WBC: 6.8 K/uL (ref 4.0–10.5)
nRBC: 0 % (ref 0.0–0.2)

## 2024-06-06 LAB — HEPATIC FUNCTION PANEL
ALT: 9 U/L (ref 0–44)
AST: 23 U/L (ref 15–41)
Albumin: 4 g/dL (ref 3.5–5.0)
Alkaline Phosphatase: 89 U/L (ref 38–126)
Bilirubin, Direct: 1.2 mg/dL — ABNORMAL HIGH (ref 0.0–0.2)
Indirect Bilirubin: 1.4 mg/dL — ABNORMAL HIGH (ref 0.3–0.9)
Total Bilirubin: 2.6 mg/dL — ABNORMAL HIGH (ref 0.0–1.2)
Total Protein: 7.8 g/dL (ref 6.5–8.1)

## 2024-06-06 LAB — LIPASE, BLOOD: Lipase: 16 U/L (ref 11–51)

## 2024-06-06 LAB — RESP PANEL BY RT-PCR (RSV, FLU A&B, COVID)  RVPGX2
Influenza A by PCR: NEGATIVE
Influenza B by PCR: NEGATIVE
Resp Syncytial Virus by PCR: NEGATIVE
SARS Coronavirus 2 by RT PCR: NEGATIVE

## 2024-06-06 LAB — LACTIC ACID, PLASMA
Lactic Acid, Venous: 1.1 mmol/L (ref 0.5–1.9)
Lactic Acid, Venous: 1 mmol/L (ref 0.5–1.9)

## 2024-06-06 LAB — D-DIMER, QUANTITATIVE: D-Dimer, Quant: 9.25 ug{FEU}/mL — ABNORMAL HIGH (ref 0.00–0.50)

## 2024-06-06 LAB — TROPONIN T, HIGH SENSITIVITY
Troponin T High Sensitivity: 66 ng/L — ABNORMAL HIGH (ref 0–19)
Troponin T High Sensitivity: 69 ng/L — ABNORMAL HIGH (ref 0–19)

## 2024-06-06 LAB — CK: Total CK: 36 U/L — ABNORMAL LOW (ref 38–234)

## 2024-06-06 LAB — PROTIME-INR
INR: 1.5 — ABNORMAL HIGH (ref 0.8–1.2)
Prothrombin Time: 18.4 s — ABNORMAL HIGH (ref 11.4–15.2)

## 2024-06-06 MED ORDER — ONDANSETRON HCL 4 MG/2ML IJ SOLN: 4.0000 mg | Freq: Four times a day (QID) | INTRAMUSCULAR | Status: DC | PRN

## 2024-06-06 MED ORDER — LOSARTAN POTASSIUM 25 MG PO TABS
25.0000 mg | ORAL_TABLET | Freq: Every day | ORAL | Status: DC
Start: 2024-06-06 — End: 2024-06-07
  Administered 2024-06-06: 25 mg via ORAL
  Filled 2024-06-06: qty 1

## 2024-06-06 MED ORDER — ACETAMINOPHEN 650 MG RE SUPP: 650.0000 mg | Freq: Four times a day (QID) | RECTAL | Status: DC | PRN

## 2024-06-06 MED ORDER — ROSUVASTATIN CALCIUM 5 MG PO TABS
5.0000 mg | ORAL_TABLET | Freq: Every day | ORAL | Status: DC
  Administered 2024-06-06 – 2024-06-13 (×8): 5 mg via ORAL
  Filled 2024-06-06 (×8): qty 1

## 2024-06-06 MED ORDER — LACTATED RINGERS IV BOLUS (SEPSIS)
1000.0000 mL | Freq: Once | INTRAVENOUS | Status: AC
  Administered 2024-06-06: 1000 mL via INTRAVENOUS

## 2024-06-06 MED ORDER — IOHEXOL 350 MG/ML SOLN
75.0000 mL | Freq: Once | INTRAVENOUS | Status: AC | PRN
  Administered 2024-06-06: 75 mL via INTRAVENOUS

## 2024-06-06 MED ORDER — SODIUM CHLORIDE 0.9 % IV SOLN
500.0000 mg | INTRAVENOUS | Status: AC
  Administered 2024-06-07 – 2024-06-11 (×5): 500 mg via INTRAVENOUS
  Filled 2024-06-06 (×5): qty 5

## 2024-06-06 MED ORDER — CEFTRIAXONE SODIUM 1 G IJ SOLR
1.0000 g | INTRAMUSCULAR | Status: AC
  Administered 2024-06-07 – 2024-06-11 (×5): 1 g via INTRAVENOUS
  Filled 2024-06-06 (×5): qty 10

## 2024-06-06 MED ORDER — ONDANSETRON HCL 4 MG PO TABS: 4.0000 mg | ORAL_TABLET | Freq: Four times a day (QID) | ORAL | Status: DC | PRN

## 2024-06-06 MED ORDER — APIXABAN 5 MG PO TABS
5.0000 mg | ORAL_TABLET | Freq: Two times a day (BID) | ORAL | Status: DC
Start: 2024-06-06 — End: 2024-06-07
  Administered 2024-06-06 – 2024-06-07 (×3): 5 mg via ORAL
  Filled 2024-06-06 (×3): qty 1

## 2024-06-06 MED ORDER — LACTATED RINGERS IV BOLUS
1000.0000 mL | Freq: Once | INTRAVENOUS | Status: AC
  Administered 2024-06-06: 1000 mL via INTRAVENOUS

## 2024-06-06 MED ORDER — LACTATED RINGERS IV SOLN: INTRAVENOUS | Status: AC

## 2024-06-06 MED ORDER — SODIUM CHLORIDE 0.9 % IV SOLN
500.0000 mg | Freq: Once | INTRAVENOUS | Status: AC
  Administered 2024-06-06: 500 mg via INTRAVENOUS
  Filled 2024-06-06: qty 5

## 2024-06-06 MED ORDER — ACETAMINOPHEN 325 MG PO TABS
650.0000 mg | ORAL_TABLET | Freq: Four times a day (QID) | ORAL | Status: DC | PRN
  Administered 2024-06-06 – 2024-06-09 (×4): 650 mg via ORAL
  Filled 2024-06-06 (×4): qty 2

## 2024-06-06 MED ORDER — SODIUM CHLORIDE 0.9 % IV SOLN
2.0000 g | Freq: Once | INTRAVENOUS | Status: AC
  Administered 2024-06-06: 2 g via INTRAVENOUS
  Filled 2024-06-06: qty 20

## 2024-06-06 MED ORDER — DOCUSATE SODIUM 100 MG PO CAPS
100.0000 mg | ORAL_CAPSULE | Freq: Two times a day (BID) | ORAL | Status: DC
  Administered 2024-06-07 – 2024-06-13 (×13): 100 mg via ORAL
  Filled 2024-06-06 (×14): qty 1

## 2024-06-06 MED ORDER — ASPIRIN 81 MG PO CHEW
324.0000 mg | CHEWABLE_TABLET | Freq: Once | ORAL | Status: AC
  Administered 2024-06-06: 324 mg via ORAL
  Filled 2024-06-06: qty 4

## 2024-06-06 MED ORDER — SODIUM CHLORIDE 0.9 % IV SOLN: INTRAVENOUS | Status: AC

## 2024-06-06 MED ORDER — GADOBUTROL 1 MMOL/ML IV SOLN
7.0000 mL | Freq: Once | INTRAVENOUS | Status: AC | PRN
  Administered 2024-06-06: 7 mL via INTRAVENOUS

## 2024-06-06 MED ORDER — METOPROLOL TARTRATE 25 MG PO TABS
25.0000 mg | ORAL_TABLET | Freq: Two times a day (BID) | ORAL | Status: DC
Start: 2024-06-06 — End: 2024-06-07
  Administered 2024-06-06 (×2): 25 mg via ORAL
  Filled 2024-06-06 (×2): qty 1

## 2024-06-06 NOTE — ED Provider Notes (Signed)
 [place] EMERGENCY DEPARTMENT AT [facility] Provider Note   CSN: [id number] Arrival date & time: 06/06/24  9280     History Chief Complaint  Patient presents with   Cough    HPI: Gina Duncan is a 83 y.o. female with history pertinent for atrial fibrillation, CAD, HTN, GAD, hypokalemia, A-fib, heart failure, prior endometrial cancer who presents complaining of numerous symptoms. Patient arrived via POV accompanied by husband and daughter.  History provided by patient and relative: Daughter.  No interpreter required during this encounter.  Patient reports that 4 days ago she was outside when it was windy and reports that she has a poor constitution for exposure to the wind.  Reports that since then she has had decreased appetite, diffuse mild abdominal pain, emesis, cough, congestion.  Denies any fever, chills, chest pain, shortness of breath.  Reports that she has a poor appetite at baseline, however she has had even less oral intake over the past several days and then baseline.  Reports that she was feeling general malaise and fatigue that had also increased today, therefore they came to the emergency department for further evaluation.  Patient's recorded medical, surgical, social, medication list and allergies were reviewed in the Snapshot window as part of the initial history.   Prior to Admission medications   Medication Sig Start Date End Date Taking? Authorizing Provider  acetaminophen  (TYLENOL ) 500 MG tablet Take 500 mg by mouth every 6 (six) hours as needed for moderate pain (pain score 4-6).   Yes [provider]  apixaban  (ELIQUIS ) 5 MG TABS tablet Take 1 tablet (5 mg total) by mouth 2 (two) times daily. 02/24/24  Yes Lavona Agent, MD  losartan  (COZAAR ) 50 MG tablet Take 50 mg by mouth daily.   Yes [provider]  magnesium  oxide (MAG-OX) 400 MG tablet Take 1 tablet (400 mg total) by mouth daily. 06/11/22  Yes Lavona Agent, MD  metoprolol   tartrate (LOPRESSOR ) 25 MG tablet Take 1 tablet (25 mg total) by mouth 2 (two) times daily. 10/29/23  Yes Lavona Agent, MD  omeprazole (PRILOSEC OTC) 20 MG tablet Take 20 mg by mouth daily as needed (for heartburn).   Yes [provider]  rosuvastatin  (CRESTOR ) 5 MG tablet Take 1 tablet by mouth once daily 04/20/24  Yes Lavona Agent, MD     Allergies: Patient has no known allergies.   Review of Systems   ROS as per HPI  Physical Exam Updated Vital Signs BP (!) 194/89 (BP Location: Left Arm)   Pulse (!) 119   Temp 98.8 F (37.1 C)   Resp 19   Ht 5' 5 (1.651 m)   Wt 64 kg   SpO2 92%   BMI 23.46 kg/m  Physical Exam Vitals and nursing note reviewed.  Constitutional:      General: She is not in acute distress.    Appearance: She is well-developed.  HENT:     Head: Normocephalic and atraumatic.  Eyes:     Conjunctiva/sclera: Conjunctivae normal.  Cardiovascular:     Rate and Rhythm: Regular rhythm. Tachycardia present.     Pulses:          Radial pulses are 2+ on the right side and 2+ on the left side.     Heart sounds: No murmur heard. Pulmonary:     Effort: Pulmonary effort is normal. No respiratory distress.     Breath sounds: Normal breath sounds.  Abdominal:     Palpations: Abdomen is soft.  Tenderness: There is abdominal tenderness (Mild, diffuse).  Musculoskeletal:        General: No swelling.     Cervical back: Neck supple.     Right lower leg: No edema.     Left lower leg: No edema.  Skin:    General: Skin is warm and dry.     Capillary Refill: Capillary refill takes less than 2 seconds.  Neurological:     Mental Status: She is alert.  Psychiatric:        Mood and Affect: Mood normal.     ED Course/ Medical Decision Making/ A&P  Procedures Procedures   Medications Ordered in ED Medications  lactated ringers infusion ( Intravenous New Bag/Given 06/06/24 1222)  losartan  (COZAAR ) tablet 25 mg (25 mg Oral Given 06/06/24 1439)  metoprolol   tartrate (LOPRESSOR ) tablet 25 mg (25 mg Oral Given 06/06/24 1439)  rosuvastatin  (CRESTOR ) tablet 5 mg (5 mg Oral Given 06/06/24 1438)  apixaban  (ELIQUIS ) tablet 5 mg (5 mg Oral Given 06/06/24 1439)  0.9 %  sodium chloride  infusion ( Intravenous New Bag/Given 06/06/24 1644)  acetaminophen  (TYLENOL ) tablet 650 mg (has no administration in time range)    Or  acetaminophen  (TYLENOL ) suppository 650 mg (has no administration in time range)  docusate sodium (COLACE) capsule 100 mg (has no administration in time range)  ondansetron (ZOFRAN) tablet 4 mg (has no administration in time range)    Or  ondansetron (ZOFRAN) injection 4 mg (has no administration in time range)  cefTRIAXone  (ROCEPHIN ) 1 g in sodium chloride  0.9 % 100 mL IVPB (has no administration in time range)  azithromycin  (ZITHROMAX ) 500 mg in sodium chloride  0.9 % 250 mL IVPB (has no administration in time range)  lactated ringers bolus 1,000 mL (0 mLs Intravenous Stopped 06/06/24 1016)  aspirin chewable tablet 324 mg (324 mg Oral Given 06/06/24 0916)  iohexol (OMNIPAQUE) 350 MG/ML injection 75 mL (75 mLs Intravenous Contrast Given 06/06/24 0845)  lactated ringers bolus 1,000 mL (1,000 mLs Intravenous New Bag/Given 06/06/24 1220)  cefTRIAXone  (ROCEPHIN ) 2 g in sodium chloride  0.9 % 100 mL IVPB (2 g Intravenous New Bag/Given 06/06/24 1227)  azithromycin  (ZITHROMAX ) 500 mg in sodium chloride  0.9 % 250 mL IVPB (500 mg Intravenous New Bag/Given 06/06/24 1309)    Medical Decision Making:   Gina Duncan is a 83 y.o. female who presents for numerous symptoms as per above.  Physical exam is pertinent for mild diffuse abdominal tenderness, tachycardia.   The differential includes but is not limited to ACS, pulmonary embolism, pancreatitis, cholecystitis, AKI, rhabdomyolysis, electrolyte derangement, dehydration, viral illness, pneumonia.  Independent historian: Relative: Daughter  External data reviewed: No pertinent external data  Labs:  Ordered, Independent interpretation, and Details: CMP without AKI, mild hyponatremia to 134.  Hypochloremia to 94 which is similar in comparison to prior.  No LFT abnormality, however new hyperbilirubinemia to 2.6. Hepatic function panel with direct and indirect hyperbilirubinemia.  CBC without leukocytosis, anemia, thrombocytopenia.  UA without UTI. CK managed at 36.  D-dimer elevated at 9.25.  Troponin initially elevated at 66, delta 69.  COVID/flu/RSV negative.  Lipase WNL  Radiology: Ordered, Independent interpretation, Details: CT PE study without which central PE or focal parenchymal opacification.  CT of the abdomen pelvis with large renal cyst, as well as hypodensity of the liver most prominently in the left lobe, and All images reviewed independently.  Agree with radiology report at this time.   CT ABDOMEN PELVIS W CONTRAST Result Date: 06/06/2024 CLINICAL DATA:  PE  suspected, cough, vomiting * Tracking Code: BO * EXAM: CT ANGIOGRAPHY CHEST CT ABDOMEN AND PELVIS WITH CONTRAST TECHNIQUE: Multidetector CT imaging of the chest was performed using the standard protocol during bolus administration of intravenous contrast. Multiplanar CT image reconstructions and MIPs were obtained to evaluate the vascular anatomy. Multidetector CT imaging of the abdomen and pelvis was performed using the standard protocol during bolus administration of intravenous contrast. RADIATION DOSE REDUCTION: This exam was performed according to the departmental dose-optimization program which includes automated exposure control, adjustment of the mA and/or kV according to patient size and/or use of iterative reconstruction technique. CONTRAST:  75mL OMNIPAQUE IOHEXOL 350 MG/ML SOLN COMPARISON:  None Available. FINDINGS: CT CHEST ANGIOGRAM FINDINGS Cardiovascular: Satisfactory opacification of the pulmonary arteries to the segmental level. No evidence of pulmonary embolism. Normal heart size. Three-vessel coronary artery  calcifications. Enlargement of the main pulmonary artery measuring up to 3.6 cm in caliber. No pericardial effusion. Severe aortic atherosclerosis. Mediastinum/Nodes: Enlarged right cardiophrenic angle lymph nodes measuring up to 1.4 x 1.1 cm (series 2, image 16). No other enlarged mediastinal, hilar, or axillary lymph nodes. Thyroid gland, trachea, and esophagus demonstrate no significant findings. Lungs/Pleura: Diffuse bilateral bronchial wall thickening. Extensive clustered nodular airspace opacity throughout the lungs, particularly in the posterior upper lobes (series 6, image 45) although with several more discrete nodular appearing opacities throughout the lungs, for example in the peripheral anterior right lower lobe measuring 1.1 x 0.9 cm (series 6, image 98). No pleural effusion or pneumothorax. Musculoskeletal: No chest wall abnormality. No acute osseous findings. Review of the MIP images confirms the above findings. CT ABDOMEN PELVIS FINDINGS Hepatobiliary: Large, rim enhancing mass in the left lobe of the liver, centered in hepatic segment IV, measuring 5.7 x 4.8 cm with associated segmental biliary ductal obstruction and atrophy of the left lobe (series 2, image 21). No gallstones, gallbladder wall thickening, or extrahepatic biliary dilatation. Pancreas: Unremarkable. No pancreatic ductal dilatation or surrounding inflammatory changes. Spleen: Normal in size without significant abnormality. Adrenals/Urinary Tract: Adrenal glands are unremarkable. Simple, benign bilateral renal cortical cysts. Kidneys are otherwise normal, without renal calculi, solid lesion, or hydronephrosis. Bladder is unremarkable. Stomach/Bowel: Stomach is within normal limits. Appendix appears normal. No evidence of bowel wall thickening, distention, or inflammatory changes. Descending and sigmoid diverticulosis. Vascular/Lymphatic: Severe aortic atherosclerosis. No enlarged abdominal or pelvic lymph nodes. Reproductive: No mass  or other significant abnormality. Other: No abdominal wall hernia or abnormality. Small volume perihepatic ascites. Musculoskeletal: No acute or significant osseous findings. IMPRESSION: 1. Negative examination for pulmonary embolism. 2. Large, rim enhancing mass in the left lobe of the liver, centered in hepatic segment IV, measuring 5.7 x 4.8 cm with associated segmental biliary ductal obstruction and atrophy of the left lobe. 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 3. Enlarged right cardiophrenic angle lymph nodes, highly suspicious for nodal metastatic disease. 4. Small volume perihepatic ascites. 5. Extensive clustered nodular airspace opacity throughout the lungs, particularly in the posterior upper lobes, with several more discrete nodular appearing opacities throughout the lungs. There is some component of atypical infection, however small pulmonary metastases amongst these airspace opacities very difficult to confidently exclude. 6. Diffuse bilateral bronchial wall thickening, consistent with nonspecific infectious or inflammatory bronchitis. 7. Enlargement of the main pulmonary artery, as can be seen in pulmonary hypertension. 8. Coronary artery disease. Aortic Atherosclerosis (ICD10-I70.0). Electronically Signed   By: Marolyn JONETTA Marlyce CHRISTELLA.D.  On: 06/06/2024 10:17   CT Angio Chest PE W and/or Wo Contrast Result Date: 06/06/2024 CLINICAL DATA:  PE suspected, cough, vomiting * Tracking Code: BO * EXAM: CT ANGIOGRAPHY CHEST CT ABDOMEN AND PELVIS WITH CONTRAST TECHNIQUE: Multidetector CT imaging of the chest was performed using the standard protocol during bolus administration of intravenous contrast. Multiplanar CT image reconstructions and MIPs were obtained to evaluate the vascular anatomy. Multidetector CT imaging of the abdomen and pelvis was performed using the  standard protocol during bolus administration of intravenous contrast. RADIATION DOSE REDUCTION: This exam was performed according to the departmental dose-optimization program which includes automated exposure control, adjustment of the mA and/or kV according to patient size and/or use of iterative reconstruction technique. CONTRAST:  75mL OMNIPAQUE IOHEXOL 350 MG/ML SOLN COMPARISON:  None Available. FINDINGS: CT CHEST ANGIOGRAM FINDINGS Cardiovascular: Satisfactory opacification of the pulmonary arteries to the segmental level. No evidence of pulmonary embolism. Normal heart size. Three-vessel coronary artery calcifications. Enlargement of the main pulmonary artery measuring up to 3.6 cm in caliber. No pericardial effusion. Severe aortic atherosclerosis. Mediastinum/Nodes: Enlarged right cardiophrenic angle lymph nodes measuring up to 1.4 x 1.1 cm (series 2, image 16). No other enlarged mediastinal, hilar, or axillary lymph nodes. Thyroid gland, trachea, and esophagus demonstrate no significant findings. Lungs/Pleura: Diffuse bilateral bronchial wall thickening. Extensive clustered nodular airspace opacity throughout the lungs, particularly in the posterior upper lobes (series 6, image 45) although with several more discrete nodular appearing opacities throughout the lungs, for example in the peripheral anterior right lower lobe measuring 1.1 x 0.9 cm (series 6, image 98). No pleural effusion or pneumothorax. Musculoskeletal: No chest wall abnormality. No acute osseous findings. Review of the MIP images confirms the above findings. CT ABDOMEN PELVIS FINDINGS Hepatobiliary: Large, rim enhancing mass in the left lobe of the liver, centered in hepatic segment IV, measuring 5.7 x 4.8 cm with associated segmental biliary ductal obstruction and atrophy of the left lobe (series 2, image 21). No gallstones, gallbladder wall thickening, or extrahepatic biliary dilatation. Pancreas: Unremarkable. No pancreatic ductal  dilatation or surrounding inflammatory changes. Spleen: Normal in size without significant abnormality. Adrenals/Urinary Tract: Adrenal glands are unremarkable. Simple, benign bilateral renal cortical cysts. Kidneys are otherwise normal, without renal calculi, solid lesion, or hydronephrosis. Bladder is unremarkable. Stomach/Bowel: Stomach is within normal limits. Appendix appears normal. No evidence of bowel wall thickening, distention, or inflammatory changes. Descending and sigmoid diverticulosis. Vascular/Lymphatic: Severe aortic atherosclerosis. No enlarged abdominal or pelvic lymph nodes. Reproductive: No mass or other significant abnormality. Other: No abdominal wall hernia or abnormality. Small volume perihepatic ascites. Musculoskeletal: No acute or significant osseous findings. IMPRESSION: 1. Negative examination for pulmonary embolism. 2. Large, rim enhancing mass in the left lobe of the liver, centered in hepatic segment IV, measuring 5.7 x 4.8 cm with associated segmental biliary ductal obstruction and atrophy of the left lobe. 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 3. Enlarged right cardiophrenic angle lymph nodes, highly suspicious for nodal metastatic disease. 4. Small volume perihepatic ascites. 5. Extensive clustered nodular airspace opacity throughout the lungs, particularly in the posterior upper lobes, with several more discrete nodular appearing opacities throughout the lungs. There is some component of atypical infection, however small pulmonary metastases amongst these airspace opacities very difficult to confidently exclude. 6. Diffuse bilateral bronchial wall thickening, consistent with nonspecific infectious or inflammatory bronchitis. 7. Enlargement of the main pulmonary artery, as can be seen in  pulmonary hypertension. 8. Coronary artery disease.  Aortic Atherosclerosis (ICD10-I70.0). Electronically Signed   By: Marolyn JONETTA Jaksch M.D.   On: 06/06/2024 10:17    EKG/Medicine tests: Ordered and Independent interpretation EKG Interpretation: Sinus tachycardia  Consider right atrial enlargement  Borderline right axis deviation  Probable LVH with secondary repol abnrm  ST depression, consider ischemia, diffuse lds, similar to prior from today Confirmed by Rogelia Satterfield ([id number]) on 06/06/2024 9:05:27 AM                Interventions: ASA, 30 cc/kg LR bolus, azithromycin /Rocephin   See the EMR for full details regarding lab and imaging results.  Patient presents for numerous nonspecific symptoms, is tachycardic on my exam with mild diffuse abdominal tenderness.  Given age, emesis, mild diffuse abdominal pain, do feel that patient requires CT of the abdomen and pelvis.  Additionally patient reports respiratory symptoms, and tachycardia on exam considered D-dimer versus CT PE study, given patient will require contrast load for CT abdomen pelvis, and patient has risk factors for PE, will obtain CT PE study contemporaneously.  Patient without specific chest pain, has numerous changes to ST segments on EKG that have appeared on numerous prior.  However given patient does have EKG changes even though she is without chest pain, do feel that troponin is indicated.  Initial troponin mildly elevated at 66, ASA administered.  Repeat mildly elevated, most consistent with demand ischemia, did consult cardiology and speak with Dr. Okey who does not feel that patient requires heparin at this time, feels that patient symptoms is most likely related to underlying medical illness and defers further workup/recommendations to medical team.  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  I did discuss likely hepatic malignancy with patient and daughter and husband at bedside.  Discussed that she will need further workup for this.  This is the  most likely etiology of patient's hyperbilirubinemia.  CT PE study also does reveal bronchitis.  Given patient has persistent tachypnea and tachycardia, will treat for sepsis secondary to pulmonary infection with ceftriaxone  and azithromycin .  Do feel that patient warrants admission.  Hospitalist consulted and spoke with Dr. Pincus who accepted the patient to his service for treatment of bronchitis, demand ischemia, further workup of liver findings.  Presentation is most consistent with acute complicated illness  Discussion of management or test interpretations with external provider(s): Dr. Okey, cardiology, Dr. Leotis, hospitalists  Risk Drugs:OTC drugs and Prescription drug management Treatment: Decision regarding hospitalization  Disposition: ADMIT: I believe the patient requires admission for further care and management. The patient was admitted to hospitalist. Please see inpatient provider note for additional treatment plan details.   MDM generated using voice dictation software and may contain dictation errors.  Please contact me for any clarification or with any questions.  Clinical Impression:  1. SIRS (systemic inflammatory response syndrome) (HCC)   2. Bronchitis   3. Weakness   4. Hyperbilirubinemia   5. Liver mass, left lobe      Admit   Final Clinical Impression(s) / ED Diagnoses Final diagnoses:  SIRS (systemic inflammatory response syndrome) (HCC)  Bronchitis  Weakness  Hyperbilirubinemia  Liver mass, left lobe    Rx / DC Orders ED Discharge Orders     None        Rogelia Satterfield RAMAN, MD 06/06/24 1651

## 2024-06-06 NOTE — TOC CM/SW Note (Signed)
 Transition of Care (TOC) CM/SW Note    Transition of Care [facility]) - Inpatient Brief Assessment   Patient Details  Name: Gina Duncan. Bencivenga MRN: [id number] Date of Birth: [birth year]  Transition of Care [facility]) CM/SW Contact:    Noreen KATHEE Cleotilde ISRAEL Phone Number: 06/06/2024, 1:09 PM   Clinical Narrative:  Inpatient Care Management (ICM) has reviewed patient and no other ICM needs have been identified at this time. We will continue to monitor patient advancement through interdisciplinary progression rounds. If new patient transition needs arise, please place a ICM consult.  Transition of Care Asessment: Insurance and Status: Insurance coverage has been reviewed Patient has primary care physician: Yes Home environment has been reviewed: Single Family Home Prior level of function:: independent Prior/Current Home Services: No current home services Social Drivers of Health Review: SDOH reviewed no interventions necessary Readmission risk has been reviewed: Yes Transition of care needs: no transition of care needs at this time

## 2024-06-06 NOTE — Consult Note (Addendum)
 Gastroenterology Consult   Referring Provider: Dr Darcel Dawley Primary Care Physician:  [name], MD Primary Gastroenterologist:  unassigned  Patient ID: Gina Duncan; [id number]; [birth year]   Admit date: 06/06/2024  LOS: 0 days   Date of Consultation: 06/06/2024  Reason for Consultation:  Liver mass  History of Present Illness   Gina Duncan is an 83 y.o. year old female with past medical history significant for afib on Eliquis , CAD, anxiety, HTN, HF, endometrial cancer in the past, presenting today to the ED with constellation of symptoms and concern for SIRS, CTA negative for PE, CT and CTA revealing large enhancing mass in left lobe of liver, segment IV, 5.7 by 4.8 cm with associated segmental biliary ductal obstruction and atrophy of left lobe, no extrahepatic biliary dilatation, pancreas unremarkable. GI consulted to assist with management. Oncology has also been requested.    In the ED: Tbili 2.6, direct 1.2, indirect 1.4, AST 23, ALT 9, Alk phos 89 WBC count 6.8, no anemia, troponin elevated at 66, D-dimer elevated at 9.25, Covid negative, lactic acid 1.1, blood cultures in process. Tachycardic in the 1teens, SBP in the 170s-190s. Tmax 99.3. CTA chest negative for PE but showed diffuse bilateral bronchial wall thickening, concern for nodal metastatic disease, concern for pulmonary metastases, infectious or inflammatory bronchitis, enlargement of pulmonary arter with concern for pulmonary hypertension. Empiric antibiotics have been started.   At time of consultation: Husband at bedside. Patient felt fine Thursday evening. Fri onset of sympoms. Felt like she caught a cold. Was at her grandmother's grave cleaning up the area. No abdominal pain. Started to lose her appetie. Tried to eat and would feel full off small amounts. Vomited a few times, sour water. No nausea today. Cough intermitently. No fever/chills. Felt tired/weak over the weekend. Feels improved with  hydration since admission.   No diarrhea, constipation, changes in bowel habits. No overt GI bleeding. Denies jaundice or pruritus. No mental status changes or confusion.   Cardiac stent in 2017 had stent placed, purposefully trying to lose weight since that time.   NO prior EGD or colonoscopy  No FH colon cancer or polyps. No FH liver disease.    Past Medical History:  Diagnosis Date   CAD (coronary artery disease)    DES to LAD 2017   Endometrial cancer (HCC)    Hypertension    Radiation 06/01/15, 06/12/15, 06/15/15, 06/19/15, 06/21/15   proximal vagina 30 gray    Past Surgical History:  Procedure Laterality Date   CARDIOVERSION N/A 04/21/2022   Procedure: CARDIOVERSION;  Surgeon: Shlomo Wilbert SAUNDERS, MD;  Location: [facility] ENDOSCOPY;  Service: Cardiovascular;  Laterality: N/A;   CHOLECYSTECTOMY     ROBOTIC ASSISTED TOTAL HYSTERECTOMY WITH BILATERAL SALPINGO OOPHERECTOMY  02/13/2015   by Dr. Arlee   TEE WITHOUT CARDIOVERSION N/A 04/21/2022   Procedure: TRANSESOPHAGEAL ECHOCARDIOGRAM (TEE);  Surgeon: Shlomo Wilbert SAUNDERS, MD;  Location: [facility] ENDOSCOPY;  Service: Cardiovascular;  Laterality: N/A;    Prior to Admission medications   Medication Sig Start Date End Date Taking? Authorizing Provider  acetaminophen  (TYLENOL ) 500 MG tablet Take 500 mg by mouth every 6 (six) hours as needed for moderate pain (pain score 4-6).   Yes [provider]  apixaban  (ELIQUIS ) 5 MG TABS tablet Take 1 tablet (5 mg total) by mouth 2 (two) times daily. 02/24/24  Yes Lavona Agent, MD  losartan  (COZAAR ) 50 MG tablet Take 50 mg by mouth daily.   Yes [provider]  magnesium  oxide (MAG-OX)  400 MG tablet Take 1 tablet (400 mg total) by mouth daily. 06/11/22  Yes Lavona Agent, MD  metoprolol  tartrate (LOPRESSOR ) 25 MG tablet Take 1 tablet (25 mg total) by mouth 2 (two) times daily. 10/29/23  Yes Lavona Agent, MD  omeprazole (PRILOSEC OTC) 20 MG tablet Take 20 mg by mouth daily as needed (for  heartburn).   Yes [provider]  rosuvastatin  (CRESTOR ) 5 MG tablet Take 1 tablet by mouth once daily 04/20/24  Yes Lavona Agent, MD    Current Facility-Administered Medications  Medication Dose Route Frequency Provider Last Rate Last Admin   0.9 %  sodium chloride  infusion   Intravenous Continuous Leotis Bogus, MD       acetaminophen  (TYLENOL ) tablet 650 mg  650 mg Oral Q6H PRN Leotis Bogus, MD       Or   acetaminophen  (TYLENOL ) suppository 650 mg  650 mg Rectal Q6H PRN Leotis Bogus, MD       apixaban  (ELIQUIS ) tablet 5 mg  5 mg Oral BID Leotis Bogus, MD   5 mg at 06/06/24 1439   [START ON 06/07/2024] azithromycin  (ZITHROMAX ) 500 mg in sodium chloride  0.9 % 250 mL IVPB  500 mg Intravenous Q24H Leotis Bogus, MD       [START ON 06/07/2024] cefTRIAXone  (ROCEPHIN ) 1 g in sodium chloride  0.9 % 100 mL IVPB  1 g Intravenous Q24H Khatri, Pardeep, MD       docusate sodium (COLACE) capsule 100 mg  100 mg Oral BID Leotis Bogus, MD       lactated ringers infusion   Intravenous Continuous Rogelia Jerilynn RAMAN, MD 150 mL/hr at 06/06/24 1222 New Bag at 06/06/24 1222   losartan  (COZAAR ) tablet 25 mg  25 mg Oral Daily Leotis Bogus, MD   25 mg at 06/06/24 1439   metoprolol  tartrate (LOPRESSOR ) tablet 25 mg  25 mg Oral BID Leotis Bogus, MD   25 mg at 06/06/24 1439   ondansetron (ZOFRAN) tablet 4 mg  4 mg Oral Q6H PRN Leotis Bogus, MD       Or   ondansetron (ZOFRAN) injection 4 mg  4 mg Intravenous Q6H PRN Leotis Bogus, MD       rosuvastatin  (CRESTOR ) tablet 5 mg  5 mg Oral Daily Leotis Bogus, MD   5 mg at 06/06/24 1438    Allergies as of 06/06/2024   (No Known Allergies)    Family History  Problem Relation Age of Onset   Diabetes Mother    Peripheral vascular disease Mother    Stroke Father    Breast cancer Daughter     Social History   Socioeconomic History   Marital status: Married    Spouse name: Not on file   Number of children: 2   Years of  education: Not on file   Highest education level: Not on file  Occupational History   Not on file  Tobacco Use   Smoking status: Former    Types: Cigarettes   Smokeless tobacco: Former  Substance and Sexual Activity   Alcohol use: No    Alcohol/week: 0.0 standard drinks of alcohol   Drug use: No   Sexual activity: Not on file  Other Topics Concern   Not on file  Social History Narrative   Married, 2 children and 8 grand and 5 great   Social Drivers of Health   Financial Resource Strain: Not on file  Food Insecurity: No Food Insecurity (06/06/2024)   Hunger Vital Sign    Worried About  Running Out of Food in the Last Year: Never true    Ran Out of Food in the Last Year: Never true  Transportation Needs: No Transportation Needs (06/06/2024)   PRAPARE - Administrator, Civil Service (Medical): No    Lack of Transportation (Non-Medical): No  Physical Activity: Not on file  Stress: Not on file  Social Connections: Socially Integrated (06/06/2024)   Social Connection and Isolation Panel    Frequency of Communication with Friends and Family: More than three times a week    Frequency of Social Gatherings with Friends and Family: More than three times a week    Attends Religious Services: More than 4 times per year    Active Member of [employer] or Organizations: Yes    Attends Engineer, structural: More than 4 times per year    Marital Status: Married  Catering manager Violence: Not At Risk (06/06/2024)   Humiliation, Afraid, Rape, and Kick questionnaire    Fear of Current or Ex-Partner: No    Emotionally Abused: No    Physically Abused: No    Sexually Abused: No     Review of Systems   Gen: see HPI CV: Denies chest pain, heart palpitations, syncope, edema  Resp: see HPI GI: Denies vomiting blood, jaundice, and fecal incontinence.   Denies dysphagia or odynophagia. GU : Denies urinary burning, blood in urine, urinary frequency, and urinary incontinence. MS:  Denies joint pain, limitation of movement, swelling, cramps, and atrophy.  Derm: Denies rash, itching, dry skin, hives. Psych: Denies depression, anxiety, memory loss, hallucinations, and confusion. Heme: Denies bruising or bleeding Neuro:  Denies any headaches, dizziness, paresthesias, shaking  Physical Exam   Vital Signs in last 24 hours: Temp:  [97.6 F (36.4 C)-99.3 F (37.4 C)] 98.8 F (37.1 C) (10/06 1339) Pulse Rate:  [87-119] 119 (10/06 1339) Resp:  [19-30] 19 (10/06 1339) BP: (152-199)/(65-134) 194/89 (10/06 1339) SpO2:  [88 %-97 %] 92 % (10/06 1339) Weight:  [59 kg-64 kg] 64 kg (10/06 1333) Last BM Date : 06/05/24  General:   Alert,  chronically ill-appearing, sallow complexion Head:  Normocephalic and atraumatic. Lungs:  scattered rhonchi, no distress Heart:  S1 S2 present with murmur, tachycardic in 1 teens Abdomen:  Soft, nontender and nondistended. No masses, hepatosplenomegaly or hernias noted. Normal bowel sounds, without guarding, and without rebound.   Rectal: deferred   Msk:  Symmetrical without gross deformities. Normal posture. Neurologic:  Alert and  oriented x4. Psych:  Alert and cooperative. Normal mood and affect.  Intake/Output from previous day: No intake/output data recorded. Intake/Output this shift: Total I/O In: 300 [P.O.:300] Out: -     Labs/Studies   Recent Labs Recent Labs    06/06/24 0755  WBC 6.8  HGB 13.2  HCT 40.1  PLT 215   BMET Recent Labs    06/06/24 0755  NA 134*  K 3.9  CL 94*  CO2 28  GLUCOSE 126*  BUN 12  CREATININE 0.81  CALCIUM  9.5   LFT Recent Labs    06/06/24 0755 06/06/24 0834  PROT 7.7 7.8  ALBUMIN 4.0 4.0  AST 21 23  ALT 8 9  ALKPHOS 87 89  BILITOT 2.6* 2.6*  BILIDIR  --  1.2*  IBILI  --  1.4*     Radiology/Studies CT ABDOMEN PELVIS W CONTRAST Result Date: 06/06/2024 CLINICAL DATA:  PE suspected, cough, vomiting * Tracking Code: BO * EXAM: CT ANGIOGRAPHY CHEST CT ABDOMEN AND PELVIS  WITH CONTRAST  TECHNIQUE: Multidetector CT imaging of the chest was performed using the standard protocol during bolus administration of intravenous contrast. Multiplanar CT image reconstructions and MIPs were obtained to evaluate the vascular anatomy. Multidetector CT imaging of the abdomen and pelvis was performed using the standard protocol during bolus administration of intravenous contrast. RADIATION DOSE REDUCTION: This exam was performed according to the departmental dose-optimization program which includes automated exposure control, adjustment of the mA and/or kV according to patient size and/or use of iterative reconstruction technique. CONTRAST:  75mL OMNIPAQUE IOHEXOL 350 MG/ML SOLN COMPARISON:  None Available. FINDINGS: CT CHEST ANGIOGRAM FINDINGS Cardiovascular: Satisfactory opacification of the pulmonary arteries to the segmental level. No evidence of pulmonary embolism. Normal heart size. Three-vessel coronary artery calcifications. Enlargement of the main pulmonary artery measuring up to 3.6 cm in caliber. No pericardial effusion. Severe aortic atherosclerosis. Mediastinum/Nodes: Enlarged right cardiophrenic angle lymph nodes measuring up to 1.4 x 1.1 cm (series 2, image 16). No other enlarged mediastinal, hilar, or axillary lymph nodes. Thyroid gland, trachea, and esophagus demonstrate no significant findings. Lungs/Pleura: Diffuse bilateral bronchial wall thickening. Extensive clustered nodular airspace opacity throughout the lungs, particularly in the posterior upper lobes (series 6, image 45) although with several more discrete nodular appearing opacities throughout the lungs, for example in the peripheral anterior right lower lobe measuring 1.1 x 0.9 cm (series 6, image 98). No pleural effusion or pneumothorax. Musculoskeletal: No chest wall abnormality. No acute osseous findings. Review of the MIP images confirms the above findings. CT ABDOMEN PELVIS FINDINGS Hepatobiliary: Large, rim enhancing  mass in the left lobe of the liver, centered in hepatic segment IV, measuring 5.7 x 4.8 cm with associated segmental biliary ductal obstruction and atrophy of the left lobe (series 2, image 21). No gallstones, gallbladder wall thickening, or extrahepatic biliary dilatation. Pancreas: Unremarkable. No pancreatic ductal dilatation or surrounding inflammatory changes. Spleen: Normal in size without significant abnormality. Adrenals/Urinary Tract: Adrenal glands are unremarkable. Simple, benign bilateral renal cortical cysts. Kidneys are otherwise normal, without renal calculi, solid lesion, or hydronephrosis. Bladder is unremarkable. Stomach/Bowel: Stomach is within normal limits. Appendix appears normal. No evidence of bowel wall thickening, distention, or inflammatory changes. Descending and sigmoid diverticulosis. Vascular/Lymphatic: Severe aortic atherosclerosis. No enlarged abdominal or pelvic lymph nodes. Reproductive: No mass or other significant abnormality. Other: No abdominal wall hernia or abnormality. Small volume perihepatic ascites. Musculoskeletal: No acute or significant osseous findings. IMPRESSION: 1. Negative examination for pulmonary embolism. 2. Large, rim enhancing mass in the left lobe of the liver, centered in hepatic segment IV, measuring 5.7 x 4.8 cm with associated segmental biliary ductal obstruction and atrophy of the left lobe. 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 3. Enlarged right cardiophrenic angle lymph nodes, highly suspicious for nodal metastatic disease. 4. Small volume perihepatic ascites. 5. Extensive clustered nodular airspace opacity throughout the lungs, particularly in the posterior upper lobes, with several more discrete nodular appearing opacities throughout the lungs. There is some component of atypical infection, however small  pulmonary metastases amongst these airspace opacities very difficult to confidently exclude. 6. Diffuse bilateral bronchial wall thickening, consistent with nonspecific infectious or inflammatory bronchitis. 7. Enlargement of the main pulmonary artery, as can be seen in pulmonary hypertension. 8. Coronary artery disease. Aortic Atherosclerosis (ICD10-I70.0). Electronically Signed   By: Marolyn JONETTA Jaksch M.D.   On: 06/06/2024 10:17   CT Angio Chest PE W and/or Wo Contrast Result Date: 06/06/2024 CLINICAL DATA:  PE suspected, cough, vomiting * Tracking Code: BO * EXAM: CT ANGIOGRAPHY CHEST CT ABDOMEN AND PELVIS WITH CONTRAST TECHNIQUE: Multidetector CT imaging of the chest was performed using the standard protocol during bolus administration of intravenous contrast. Multiplanar CT image reconstructions and MIPs were obtained to evaluate the vascular anatomy. Multidetector CT imaging of the abdomen and pelvis was performed using the standard protocol during bolus administration of intravenous contrast. RADIATION DOSE REDUCTION: This exam was performed according to the departmental dose-optimization program which includes automated exposure control, adjustment of the mA and/or kV according to patient size and/or use of iterative reconstruction technique. CONTRAST:  75mL OMNIPAQUE IOHEXOL 350 MG/ML SOLN COMPARISON:  None Available. FINDINGS: CT CHEST ANGIOGRAM FINDINGS Cardiovascular: Satisfactory opacification of the pulmonary arteries to the segmental level. No evidence of pulmonary embolism. Normal heart size. Three-vessel coronary artery calcifications. Enlargement of the main pulmonary artery measuring up to 3.6 cm in caliber. No pericardial effusion. Severe aortic atherosclerosis. Mediastinum/Nodes: Enlarged right cardiophrenic angle lymph nodes measuring up to 1.4 x 1.1 cm (series 2, image 16). No other enlarged mediastinal, hilar, or axillary lymph nodes. Thyroid gland, trachea, and esophagus demonstrate no  significant findings. Lungs/Pleura: Diffuse bilateral bronchial wall thickening. Extensive clustered nodular airspace opacity throughout the lungs, particularly in the posterior upper lobes (series 6, image 45) although with several more discrete nodular appearing opacities throughout the lungs, for example in the peripheral anterior right lower lobe measuring 1.1 x 0.9 cm (series 6, image 98). No pleural effusion or pneumothorax. Musculoskeletal: No chest wall abnormality. No acute osseous findings. Review of the MIP images confirms the above findings. CT ABDOMEN PELVIS FINDINGS Hepatobiliary: Large, rim enhancing mass in the left lobe of the liver, centered in hepatic segment IV, measuring 5.7 x 4.8 cm with associated segmental biliary ductal obstruction and atrophy of the left lobe (series 2, image 21). No gallstones, gallbladder wall thickening, or extrahepatic biliary dilatation. Pancreas: Unremarkable. No pancreatic ductal dilatation or surrounding inflammatory changes. Spleen: Normal in size without significant abnormality. Adrenals/Urinary Tract: Adrenal glands are unremarkable. Simple, benign bilateral renal cortical cysts. Kidneys are otherwise normal, without renal calculi, solid lesion, or hydronephrosis. Bladder is unremarkable. Stomach/Bowel: Stomach is within normal limits. Appendix appears normal. No evidence of bowel wall thickening, distention, or inflammatory changes. Descending and sigmoid diverticulosis. Vascular/Lymphatic: Severe aortic atherosclerosis. No enlarged abdominal or pelvic lymph nodes. Reproductive: No mass or other significant abnormality. Other: No abdominal wall hernia or abnormality. Small volume perihepatic ascites. Musculoskeletal: No acute or significant osseous findings. IMPRESSION: 1. Negative examination for pulmonary embolism. 2. Large, rim enhancing mass in the left lobe of the liver, centered in hepatic segment IV, measuring 5.7 x 4.8 cm with associated segmental  biliary ductal obstruction and atrophy of the left lobe. 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 3. Enlarged right cardiophrenic angle lymph nodes, highly suspicious for nodal metastatic disease. 4. Small volume perihepatic ascites. 5. Extensive clustered nodular airspace opacity throughout the lungs, particularly in the posterior upper lobes, with several more discrete nodular appearing opacities throughout the lungs. There is some component of atypical infection, however small pulmonary metastases amongst these airspace opacities very difficult to confidently exclude. 6. Diffuse bilateral bronchial wall thickening, consistent with nonspecific infectious or inflammatory bronchitis. 7. Enlargement of the main pulmonary artery, as can be seen in pulmonary hypertension. 8. Coronary artery disease. Aortic Atherosclerosis (ICD10-I70.0). Electronically Signed   By: Marolyn JONETTA Marlyce CHRISTELLA.D.  On: 06/06/2024 10:17     Assessment   Gina Duncan is an 83 y.o. year old female with past medical history significant for afib on Eliquis , CAD, anxiety, HTN, HF, endometrial cancer in the past, presenting today to the ED with constellation of symptoms and concern for SIRS, CTA negative for PE, CT and CTA revealing large enhancing mass in left lobe of liver, segment IV, 5.7 by 4.8 cm with associated segmental biliary ductal obstruction and atrophy of left lobe, no extrahepatic biliary dilatation, pancreas unremarkable, concern for metastatic disease. GI consulted to assist with management. Oncology has also been requested.   At this point, agree with MRI/MRCP for further characterization. Agree with AFP, CEA, CA 19-9 ordered by Oncology, which will be completed in the morning. Agree with empiric antibiotics. Will need to monitor for any evolving signs/symptoms of cholangitis. Limited  options from GI standpoint. Depending on lab results, biopsy may not be necessary. Would also recommend palliative consultation during this admission.     Plan / Recommendations    Agree with labs, MRI/MRCP, add INR Empiric antibiotics Appreciate Oncology following Recommend involving palliative  Follow-up pending blood cultures in process HFP, CBC in am     06/06/2024, 3:44 PM  Therisa MICAEL Stager, PhD, ANP-BC [facility]

## 2024-06-06 NOTE — H&P (Signed)
 History and Physical    Gina Duncan FMW:[id number] DOB: 08-15-1941 DOA: 06/06/2024  PCP: [name], MD   Patient coming from: Home   I have personally briefly reviewed patient's old medical records in [facility]  Chief Complaint: Multiple complaints (cough with shortness of breath, decreased oral intake poor appetite)  HPI: Gina T. Defoor is a 83 y.o. female with PMH significant for atrial fibrillation on Eliquis , CAD, generalized anxiety disorder, hypertension, heart failure, prior endometrial cancer presented in the ED with numerous complaints.  Patient is accompanied by her husband and daughter.  Daughter reports 4 days ago she got exposed to the excessive wind since then she has been complaining about diffuse mild abdominal pain has developed cough,  chest congestion and throwing up.  She also reports generalized malaise and fatigue which has been progressive and getting worse.  Daughter denies any fever, sick contacts, recent travel.  ED Course: She was tachycardic, tachypneic, hypertensive in the ED.  HR 119, temp 98.8, RR 22, BP 194/89, SpO2 92%. Labs include sodium 134, potassium 3.2, chloride 94, bicarb 28, glucose 126, BUN 12, creatinine 0.81, calcium  9.5, anion gap 13, alkaline phosphatase 87, albumin 4.0, lipase 16, AST 23, ALT 9, total protein 7.8, total bilirubin 2.6, direct bilirubin 1.2, WBC 6.8, hemoglobin 13.2, hematocrit 40.1, platelet 215, lactic acid 1.1, troponin 69 > 66.  D-dimer 9.25. CTA chest abdomen and pelvis: No evidence of pulmonary embolism.  Large rim-enhancing mass in the left lobe of liver measuring 7.5 X4.8 with associated segmental biliary duct obstruction and atrophy of left lobe findings consistent with malignancy.  Enlarged right cardiophrenic angle lymph nodes highly suspicious for nodal metastatic disease.  Review of Systems:   Review of Systems  Constitutional:  Positive for malaise/fatigue.  HENT: Negative.    Eyes: Negative.    Respiratory:  Positive for cough and shortness of breath.   Cardiovascular: Negative.   Gastrointestinal:  Positive for abdominal pain, nausea and vomiting.  Genitourinary: Negative.   Musculoskeletal: Negative.   Skin: Negative.   Neurological: Negative.   Endo/Heme/Allergies: Negative.   Psychiatric/Behavioral: Negative.      Past Medical History:  Diagnosis Date   CAD (coronary artery disease)    DES to LAD 2017   Endometrial cancer (HCC)    Hypertension    Radiation 06/01/15, 06/12/15, 06/15/15, 06/19/15, 06/21/15   proximal vagina 30 gray    Past Surgical History:  Procedure Laterality Date   CARDIOVERSION N/A 04/21/2022   Procedure: CARDIOVERSION;  Surgeon: Shlomo Wilbert SAUNDERS, MD;  Location: [facility] ENDOSCOPY;  Service: Cardiovascular;  Laterality: N/A;   CHOLECYSTECTOMY     ROBOTIC ASSISTED TOTAL HYSTERECTOMY WITH BILATERAL SALPINGO OOPHERECTOMY  02/13/2015   by Dr. Arlee   TEE WITHOUT CARDIOVERSION N/A 04/21/2022   Procedure: TRANSESOPHAGEAL ECHOCARDIOGRAM (TEE);  Surgeon: Shlomo Wilbert SAUNDERS, MD;  Location: [facility] ENDOSCOPY;  Service: Cardiovascular;  Laterality: N/A;     reports that she has quit smoking. Her smoking use included cigarettes. She has quit using smokeless tobacco. She reports that she does not drink alcohol and does not use drugs.  No Known Allergies  Family History  Problem Relation Age of Onset   Diabetes Mother    Peripheral vascular disease Mother    Stroke Father    Breast cancer Daughter    Family history reviewed and not pertinent . Prior to Admission medications   Medication Sig Start Date End Date Taking? Authorizing Provider  acetaminophen  (TYLENOL ) 325 MG tablet Take 325 mg by  mouth daily as needed for mild pain or headache.    [provider]  apixaban  (ELIQUIS ) 5 MG TABS tablet Take 1 tablet (5 mg total) by mouth 2 (two) times daily. 02/24/24   Lavona Agent, MD  losartan  (COZAAR ) 25 MG tablet Take 1 tablet by mouth once daily 01/27/24    Lavona Agent, MD  magnesium  oxide (MAG-OX) 400 MG tablet Take 1 tablet (400 mg total) by mouth daily. 06/11/22   Lavona Agent, MD  metoprolol  tartrate (LOPRESSOR ) 25 MG tablet Take 1 tablet (25 mg total) by mouth 2 (two) times daily. 10/29/23   Lavona Agent, MD  omeprazole (PRILOSEC OTC) 20 MG tablet Take 20 mg by mouth daily as needed (for heartburn).    [provider]  rosuvastatin  (CRESTOR ) 5 MG tablet Take 1 tablet by mouth once daily 04/20/24   Lavona Agent, MD    Physical Exam: Vitals:   06/06/24 1130 06/06/24 1145 06/06/24 1333 06/06/24 1339  BP: (!) 168/69 (!) 180/77  (!) 194/89  Pulse: 97 (!) 101  (!) 119  Resp: 20 (!) 22  19  Temp:  99.3 F (37.4 C)  98.8 F (37.1 C)  TempSrc:  Oral    SpO2: 94% 94%  92%  Weight:   64 kg   Height:        Constitutional: NAD, calm, comfortable, deconditioned. Vitals:   06/06/24 1130 06/06/24 1145 06/06/24 1333 06/06/24 1339  BP: (!) 168/69 (!) 180/77  (!) 194/89  Pulse: 97 (!) 101  (!) 119  Resp: 20 (!) 22  19  Temp:  99.3 F (37.4 C)  98.8 F (37.1 C)  TempSrc:  Oral    SpO2: 94% 94%  92%  Weight:   64 kg   Height:       Eyes: PERRL, lids and conjunctivae normal ENMT: Mucous membranes are moist. Posterior pharynx clear of any exudate or lesions. Neck: normal, supple, no masses, no thyromegaly Respiratory: CTA bilaterally, no wheezing, no crackles. Normal respiratory effort. No accessory muscle use.  Cardiovascular: S1-S2 heard regular rate and rhythm, no murmurs / rubs / gallops. No extremity edema. 2+ pedal pulses. No carotid bruits.  Abdomen: Soft, no tenderness, no masses palpated. No hepatosplenomegaly. Bowel sounds positive.  Musculoskeletal: no clubbing / cyanosis. No joint deformity upper and lower extremities. Good ROM, no contractures. Normal muscle tone.  Skin: no rashes, lesions, ulcers. No induration Neurologic: CN 2-12 grossly intact. Sensation intact, DTR normal. Strength 5/5 in all 4.   Psychiatric: Normal judgment and insight. Alert and oriented x 3. Normal mood.    Labs on Admission: I have personally reviewed following labs and imaging studies  CBC: Recent Labs  Lab 06/06/24 0755  WBC 6.8  HGB 13.2  HCT 40.1  MCV 95.5  PLT 215   Basic Metabolic Panel: Recent Labs  Lab 06/06/24 0755  NA 134*  K 3.9  CL 94*  CO2 28  GLUCOSE 126*  BUN 12  CREATININE 0.81  CALCIUM  9.5   GFR: Estimated Creatinine Clearance: 49.8 mL/min (by C-G formula based on SCr of 0.81 mg/dL). Liver Function Tests: Recent Labs  Lab 06/06/24 0755 06/06/24 0834  AST 21 23  ALT 8 9  ALKPHOS 87 89  BILITOT 2.6* 2.6*  PROT 7.7 7.8  ALBUMIN 4.0 4.0   Recent Labs  Lab 06/06/24 0755  LIPASE 16   No results for input(s): AMMONIA in the last 168 hours. Coagulation Profile: No results for input(s): INR, PROTIME in the last 168  hours. Cardiac Enzymes: Recent Labs  Lab 06/06/24 0831  CKTOTAL 36*   BNP (last 3 results) No results for input(s): PROBNP in the last 8760 hours. HbA1C: No results for input(s): HGBA1C in the last 72 hours. CBG: No results for input(s): GLUCAP in the last 168 hours. Lipid Profile: No results for input(s): CHOL, HDL, LDLCALC, TRIG, CHOLHDL, LDLDIRECT in the last 72 hours. Thyroid Function Tests: No results for input(s): TSH, T4TOTAL, FREET4, T3FREE, THYROIDAB in the last 72 hours. Anemia Panel: No results for input(s): VITAMINB12, FOLATE, FERRITIN, TIBC, IRON, RETICCTPCT in the last 72 hours. Urine analysis:    Component Value Date/Time   COLORURINE YELLOW 06/06/2024 0828   APPEARANCEUR CLEAR 06/06/2024 0828   LABSPEC 1.004 (L) 06/06/2024 0828   PHURINE 6.0 06/06/2024 0828   GLUCOSEU NEGATIVE 06/06/2024 0828   HGBUR SMALL (A) 06/06/2024 0828   BILIRUBINUR NEGATIVE 06/06/2024 0828   KETONESUR 5 (A) 06/06/2024 0828   PROTEINUR NEGATIVE 06/06/2024 0828   NITRITE NEGATIVE 06/06/2024 0828    LEUKOCYTESUR NEGATIVE 06/06/2024 0828    Radiological Exams on Admission: CT ABDOMEN PELVIS W CONTRAST Result Date: 06/06/2024 CLINICAL DATA:  PE suspected, cough, vomiting * Tracking Code: BO * EXAM: CT ANGIOGRAPHY CHEST CT ABDOMEN AND PELVIS WITH CONTRAST TECHNIQUE: Multidetector CT imaging of the chest was performed using the standard protocol during bolus administration of intravenous contrast. Multiplanar CT image reconstructions and MIPs were obtained to evaluate the vascular anatomy. Multidetector CT imaging of the abdomen and pelvis was performed using the standard protocol during bolus administration of intravenous contrast. RADIATION DOSE REDUCTION: This exam was performed according to the departmental dose-optimization program which includes automated exposure control, adjustment of the mA and/or kV according to patient size and/or use of iterative reconstruction technique. CONTRAST:  75mL OMNIPAQUE IOHEXOL 350 MG/ML SOLN COMPARISON:  None Available. FINDINGS: CT CHEST ANGIOGRAM FINDINGS Cardiovascular: Satisfactory opacification of the pulmonary arteries to the segmental level. No evidence of pulmonary embolism. Normal heart size. Three-vessel coronary artery calcifications. Enlargement of the main pulmonary artery measuring up to 3.6 cm in caliber. No pericardial effusion. Severe aortic atherosclerosis. Mediastinum/Nodes: Enlarged right cardiophrenic angle lymph nodes measuring up to 1.4 x 1.1 cm (series 2, image 16). No other enlarged mediastinal, hilar, or axillary lymph nodes. Thyroid gland, trachea, and esophagus demonstrate no significant findings. Lungs/Pleura: Diffuse bilateral bronchial wall thickening. Extensive clustered nodular airspace opacity throughout the lungs, particularly in the posterior upper lobes (series 6, image 45) although with several more discrete nodular appearing opacities throughout the lungs, for example in the peripheral anterior right lower lobe measuring 1.1 x 0.9  cm (series 6, image 98). No pleural effusion or pneumothorax. Musculoskeletal: No chest wall abnormality. No acute osseous findings. Review of the MIP images confirms the above findings. CT ABDOMEN PELVIS FINDINGS Hepatobiliary: Large, rim enhancing mass in the left lobe of the liver, centered in hepatic segment IV, measuring 5.7 x 4.8 cm with associated segmental biliary ductal obstruction and atrophy of the left lobe (series 2, image 21). No gallstones, gallbladder wall thickening, or extrahepatic biliary dilatation. Pancreas: Unremarkable. No pancreatic ductal dilatation or surrounding inflammatory changes. Spleen: Normal in size without significant abnormality. Adrenals/Urinary Tract: Adrenal glands are unremarkable. Simple, benign bilateral renal cortical cysts. Kidneys are otherwise normal, without renal calculi, solid lesion, or hydronephrosis. Bladder is unremarkable. Stomach/Bowel: Stomach is within normal limits. Appendix appears normal. No evidence of bowel wall thickening, distention, or inflammatory changes. Descending and sigmoid diverticulosis. Vascular/Lymphatic: Severe aortic atherosclerosis. No enlarged abdominal or pelvic  lymph nodes. Reproductive: No mass or other significant abnormality. Other: No abdominal wall hernia or abnormality. Small volume perihepatic ascites. Musculoskeletal: No acute or significant osseous findings. IMPRESSION: 1. Negative examination for pulmonary embolism. 2. Large, rim enhancing mass in the left lobe of the liver, centered in hepatic segment IV, measuring 5.7 x 4.8 cm with associated segmental biliary ductal obstruction and atrophy of the left lobe. 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 3. Enlarged right cardiophrenic angle lymph nodes, highly suspicious for nodal metastatic disease. 4. Small volume perihepatic ascites. 5.  Extensive clustered nodular airspace opacity throughout the lungs, particularly in the posterior upper lobes, with several more discrete nodular appearing opacities throughout the lungs. There is some component of atypical infection, however small pulmonary metastases amongst these airspace opacities very difficult to confidently exclude. 6. Diffuse bilateral bronchial wall thickening, consistent with nonspecific infectious or inflammatory bronchitis. 7. Enlargement of the main pulmonary artery, as can be seen in pulmonary hypertension. 8. Coronary artery disease. Aortic Atherosclerosis (ICD10-I70.0). Electronically Signed   By: Marolyn JONETTA Jaksch M.D.   On: 06/06/2024 10:17   CT Angio Chest PE W and/or Wo Contrast Result Date: 06/06/2024 CLINICAL DATA:  PE suspected, cough, vomiting * Tracking Code: BO * EXAM: CT ANGIOGRAPHY CHEST CT ABDOMEN AND PELVIS WITH CONTRAST TECHNIQUE: Multidetector CT imaging of the chest was performed using the standard protocol during bolus administration of intravenous contrast. Multiplanar CT image reconstructions and MIPs were obtained to evaluate the vascular anatomy. Multidetector CT imaging of the abdomen and pelvis was performed using the standard protocol during bolus administration of intravenous contrast. RADIATION DOSE REDUCTION: This exam was performed according to the departmental dose-optimization program which includes automated exposure control, adjustment of the mA and/or kV according to patient size and/or use of iterative reconstruction technique. CONTRAST:  75mL OMNIPAQUE IOHEXOL 350 MG/ML SOLN COMPARISON:  None Available. FINDINGS: CT CHEST ANGIOGRAM FINDINGS Cardiovascular: Satisfactory opacification of the pulmonary arteries to the segmental level. No evidence of pulmonary embolism. Normal heart size. Three-vessel coronary artery calcifications. Enlargement of the main pulmonary artery measuring up to 3.6 cm in caliber. No pericardial effusion. Severe aortic  atherosclerosis. Mediastinum/Nodes: Enlarged right cardiophrenic angle lymph nodes measuring up to 1.4 x 1.1 cm (series 2, image 16). No other enlarged mediastinal, hilar, or axillary lymph nodes. Thyroid gland, trachea, and esophagus demonstrate no significant findings. Lungs/Pleura: Diffuse bilateral bronchial wall thickening. Extensive clustered nodular airspace opacity throughout the lungs, particularly in the posterior upper lobes (series 6, image 45) although with several more discrete nodular appearing opacities throughout the lungs, for example in the peripheral anterior right lower lobe measuring 1.1 x 0.9 cm (series 6, image 98). No pleural effusion or pneumothorax. Musculoskeletal: No chest wall abnormality. No acute osseous findings. Review of the MIP images confirms the above findings. CT ABDOMEN PELVIS FINDINGS Hepatobiliary: Large, rim enhancing mass in the left lobe of the liver, centered in hepatic segment IV, measuring 5.7 x 4.8 cm with associated segmental biliary ductal obstruction and atrophy of the left lobe (series 2, image 21). No gallstones, gallbladder wall thickening, or extrahepatic biliary dilatation. Pancreas: Unremarkable. No pancreatic ductal dilatation or surrounding inflammatory changes. Spleen: Normal in size without significant abnormality. Adrenals/Urinary Tract: Adrenal glands are unremarkable. Simple, benign bilateral renal cortical cysts. Kidneys are otherwise normal, without renal calculi, solid lesion, or hydronephrosis. Bladder is unremarkable. Stomach/Bowel: Stomach is within normal limits. Appendix appears normal. No evidence of  bowel wall thickening, distention, or inflammatory changes. Descending and sigmoid diverticulosis. Vascular/Lymphatic: Severe aortic atherosclerosis. No enlarged abdominal or pelvic lymph nodes. Reproductive: No mass or other significant abnormality. Other: No abdominal wall hernia or abnormality. Small volume perihepatic ascites. Musculoskeletal:  No acute or significant osseous findings. IMPRESSION: 1. Negative examination for pulmonary embolism. 2. Large, rim enhancing mass in the left lobe of the liver, centered in hepatic segment IV, measuring 5.7 x 4.8 cm with associated segmental biliary ductal obstruction and atrophy of the left lobe. 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 3. Enlarged right cardiophrenic angle lymph nodes, highly suspicious for nodal metastatic disease. 4. Small volume perihepatic ascites. 5. Extensive clustered nodular airspace opacity throughout the lungs, particularly in the posterior upper lobes, with several more discrete nodular appearing opacities throughout the lungs. There is some component of atypical infection, however small pulmonary metastases amongst these airspace opacities very difficult to confidently exclude. 6. Diffuse bilateral bronchial wall thickening, consistent with nonspecific infectious or inflammatory bronchitis. 7. Enlargement of the main pulmonary artery, as can be seen in pulmonary hypertension. 8. Coronary artery disease. Aortic Atherosclerosis (ICD10-I70.0). Electronically Signed   By: Marolyn JONETTA Jaksch M.D.   On: 06/06/2024 10:17    EKG: Independently reviewed.  Sinus tachycardia, LVH.  Assessment/Plan Principal Problem:   Sepsis (HCC) Active Problems:   Endometrial cancer (HCC)   Atrial fibrillation with RVR (HCC)   Hyperlipidemia, unspecified   HTN (hypertension)   Gastroesophageal reflux disease  Suspected sepsis , unclear etiology; Patient presented with tachycardia, tachypnea, nausea, vomiting, elevated liver enzymes, met SIRS criteria.  CTA chest shows suspected atelectasis. Initiated on IV ceftriaxone , azithromycin . Continue IV fluid resuscitation. Follow-up blood cultures.   De-escalate antibiotics when able to.  Elevated liver enzymes,  possible liver malignancy; Patient complains of abdominal pain, nausea vomiting CTA chest shows hepatic mass with associated lymphadenopathy GI is consulted , awaiting recommendation Monitor LFTs  Elevated troponin due to demand ischemia; Patient denies any chest pain, EKG does not show any significant ischemic changes. Likely demand ischemia in the setting of sepsis.  Essential hypertension; Continue losartan , metoprolol .  Paroxysmal A-fib: Heart rate is elevated likely in the sepsis. Continue metoprolol  and Eliquis   Hyperlipidemia : Continue Crestor .  DVT prophylaxis: Eliquis  Code Status: Full code Family Communication: No family at bedside Disposition Plan:    Status is: Inpatient Remains inpatient appropriate because: Admitted for sepsis secondary to community-acquired pneumonia   Consults called: Cardiology Admission status: Inpatient   Darcel Dawley MD [facility]   If 7PM-7AM, please contact night-coverage   06/06/2024, 2:44 PM

## 2024-06-06 NOTE — Sepsis Progress Note (Signed)
 Sepsis protocol is being followed by [facility].

## 2024-06-06 NOTE — ED Notes (Signed)
 Pt reports L upper tooth loose, she is concerned she may have an abscess.

## 2024-06-06 NOTE — ED Triage Notes (Signed)
 Pt c/o coughing since Thursday and not feeling well to the put she will cough hard enough and vomit. Pt states feeling weak. Pt states decrease in eating and drinking since then. Denies fever. Unsure if she has been around anyone else sick. Pt has hx of a-fib.

## 2024-06-06 NOTE — Plan of Care (Signed)

## 2024-06-07 ENCOUNTER — Telehealth: Payer: Self-pay | Admitting: Cardiology

## 2024-06-07 DIAGNOSIS — K7689 Other specified diseases of liver: Secondary | ICD-10-CM | POA: Diagnosis not present

## 2024-06-07 DIAGNOSIS — R160 Hepatomegaly, not elsewhere classified: Secondary | ICD-10-CM | POA: Diagnosis present

## 2024-06-07 DIAGNOSIS — I480 Paroxysmal atrial fibrillation: Secondary | ICD-10-CM | POA: Diagnosis not present

## 2024-06-07 DIAGNOSIS — R6510 Systemic inflammatory response syndrome (SIRS) of non-infectious origin without acute organ dysfunction: Principal | ICD-10-CM

## 2024-06-07 LAB — CBC
HCT: 36 % (ref 36.0–46.0)
Hemoglobin: 11.7 g/dL — ABNORMAL LOW (ref 12.0–15.0)
MCH: 31.1 pg (ref 26.0–34.0)
MCHC: 32.5 g/dL (ref 30.0–36.0)
MCV: 95.7 fL (ref 80.0–100.0)
Platelets: 222 K/uL (ref 150–400)
RBC: 3.76 MIL/uL — ABNORMAL LOW (ref 3.87–5.11)
RDW: 12.2 % (ref 11.5–15.5)
WBC: 5.7 K/uL (ref 4.0–10.5)
nRBC: 0 % (ref 0.0–0.2)

## 2024-06-07 LAB — COMPREHENSIVE METABOLIC PANEL WITH GFR
ALT: 7 U/L (ref 0–44)
AST: 18 U/L (ref 15–41)
Albumin: 3.1 g/dL — ABNORMAL LOW (ref 3.5–5.0)
Alkaline Phosphatase: 76 U/L (ref 38–126)
Anion gap: 12 (ref 5–15)
BUN: 10 mg/dL (ref 8–23)
CO2: 26 mmol/L (ref 22–32)
Calcium: 8.6 mg/dL — ABNORMAL LOW (ref 8.9–10.3)
Chloride: 96 mmol/L — ABNORMAL LOW (ref 98–111)
Creatinine, Ser: 0.63 mg/dL (ref 0.44–1.00)
GFR, Estimated: >60 mL/min (ref >60)
Glucose, Bld: 89 mg/dL (ref 70–99)
Potassium: 3.5 mmol/L (ref 3.5–5.1)
Sodium: 135 mmol/L (ref 135–145)
Total Bilirubin: 1.5 mg/dL — ABNORMAL HIGH (ref 0.0–1.2)
Total Protein: 6.2 g/dL — ABNORMAL LOW (ref 6.5–8.1)

## 2024-06-07 LAB — MAGNESIUM: Magnesium: 1.4 mg/dL — ABNORMAL LOW (ref 1.7–2.4)

## 2024-06-07 LAB — PHOSPHORUS: Phosphorus: 2.9 mg/dL (ref 2.5–4.6)

## 2024-06-07 LAB — TSH: TSH: 2.03 u[IU]/mL (ref 0.350–4.500)

## 2024-06-07 MED ORDER — AMIODARONE IV BOLUS ONLY 150 MG/100ML
150.0000 mg | Freq: Once | INTRAVENOUS | Status: AC
  Administered 2024-06-07: 150 mg via INTRAVENOUS
  Filled 2024-06-07: qty 100

## 2024-06-07 MED ORDER — AMIODARONE HCL IN DEXTROSE 360-4.14 MG/200ML-% IV SOLN
60.0000 mg/h | INTRAVENOUS | Status: AC
  Administered 2024-06-07: 60 mg/h via INTRAVENOUS
  Filled 2024-06-07 (×4): qty 200

## 2024-06-07 MED ORDER — CHLORHEXIDINE GLUCONATE CLOTH 2 % EX PADS
6.0000 | MEDICATED_PAD | Freq: Every day | CUTANEOUS | Status: DC
  Administered 2024-06-07 – 2024-06-11 (×5): 6 via TOPICAL

## 2024-06-07 MED ORDER — HEPARIN (PORCINE) 25000 UT/250ML-% IV SOLN
1300.0000 [IU]/h | INTRAVENOUS | Status: DC
  Administered 2024-06-07: 750 [IU]/h via INTRAVENOUS
  Administered 2024-06-09: 1100 [IU]/h via INTRAVENOUS
  Administered 2024-06-10: 1300 [IU]/h via INTRAVENOUS
  Filled 2024-06-07 (×3): qty 250

## 2024-06-07 MED ORDER — AMIODARONE LOAD VIA INFUSION
150.0000 mg | Freq: Once | INTRAVENOUS | Status: AC
  Administered 2024-06-07: 150 mg via INTRAVENOUS
  Filled 2024-06-07: qty 83.34

## 2024-06-07 MED ORDER — LACTATED RINGERS IV BOLUS
500.0000 mL | Freq: Once | INTRAVENOUS | Status: AC
Start: 2024-06-07 — End: 2024-06-07
  Administered 2024-06-07: 500 mL via INTRAVENOUS

## 2024-06-07 MED ORDER — NOREPINEPHRINE 4 MG/250ML-% IV SOLN
0.0000 ug/min | INTRAVENOUS | Status: DC
  Administered 2024-06-08: 4 ug/min via INTRAVENOUS
  Filled 2024-06-07: qty 250

## 2024-06-07 MED ORDER — NOREPINEPHRINE 4 MG/250ML-% IV SOLN
INTRAVENOUS | Status: AC
  Administered 2024-06-07: 3 ug/min via INTRAVENOUS
  Filled 2024-06-07: qty 250

## 2024-06-07 MED ORDER — PHENYLEPHRINE HCL-NACL 20-0.9 MG/250ML-% IV SOLN: 25.0000 ug/min | INTRAVENOUS | Status: DC

## 2024-06-07 MED ORDER — AMIODARONE HCL IN DEXTROSE 360-4.14 MG/200ML-% IV SOLN
60.0000 mg/h | INTRAVENOUS | Status: DC
  Administered 2024-06-07 – 2024-06-08 (×4): 60 mg/h via INTRAVENOUS
  Filled 2024-06-07 (×4): qty 200

## 2024-06-07 MED ORDER — SODIUM CHLORIDE 0.9 % IV SOLN: 250.0000 mL | INTRAVENOUS | Status: DC

## 2024-06-07 MED ORDER — METOPROLOL TARTRATE 5 MG/5ML IV SOLN
2.5000 mg | Freq: Once | INTRAVENOUS | Status: AC
  Administered 2024-06-07: 2.5 mg via INTRAVENOUS
  Filled 2024-06-07: qty 5

## 2024-06-07 MED ORDER — MAGNESIUM SULFATE 4 GM/100ML IV SOLN
4.0000 g | Freq: Once | INTRAVENOUS | Status: AC
  Administered 2024-06-07: 4 g via INTRAVENOUS
  Filled 2024-06-07: qty 100

## 2024-06-07 NOTE — Consult Note (Signed)
 CONSULT NOTE  Patient Care Team: [name], MD as PCP - General (Internal Medicine) [name] (General Practice)  CHIEF COMPLAINTS/PURPOSE OF CONSULTATION:  Liver mass  HISTORY OF PRESENTING ILLNESS:  Gina Duncan 83 y.o. female is an 83 year old female with past medical history significant for atrial fibrillation on Eliquis , CAD, anxiety, hypertension, heart failure with endometrial cancer who presented to the emergency room for a myriad of complaints including SIRS that started on Friday, liver mass with associated segmental biliary duct obstruction and atrophy of left lobe.  Labs show an elevated total bili at 2.6, direct 1.2 and indirect 1.4.  D-dimer elevated at 9.25.  CTA was negative for PE but showed diffuse bilateral bronchial wall thickening concerning for nodal metastasis disease, pulmonary metastasis or infection/inflammatory bronchitis.  Mild enlargement of pulmonary artery with concern for pulmonary hypertension.  She was started on IV antibiotics.  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  Intraductal 9 mm stone and tiny bilateral pleural effusions with small volume perihepatic ascites.  Right cardiophrenic angle adenopathy suspicious for nodal mass.  Interval History: Patient developed with RVR, hypotension early this morning.  She required amiodarone drip and Levophed.  Heart rate is still elevated in the 120s to 140s.  Overall, she is feeling okay.  Denies any belly pain.   MEDICAL HISTORY:  Past Medical History:  Diagnosis Date   CAD (coronary artery disease)    DES to LAD 2017   Endometrial cancer (HCC)    Hypertension    Radiation 06/01/15, 06/12/15, 06/15/15, 06/19/15, 06/21/15   proximal vagina 30 gray    SURGICAL HISTORY: Past  Surgical History:  Procedure Laterality Date   cardiac stents  2017   CARDIOVERSION N/A 04/21/2022   Procedure: CARDIOVERSION;  Surgeon: Shlomo Wilbert SAUNDERS, MD;  Location: [facility] END[facility];  Service: Cardiovascular;  Laterality: N/A;   CHOLECYSTECTOMY     ROBOTIC ASSISTED TOTAL HYSTERECTOMY WITH BILATERAL SALPINGO OOPHERECTOMY  02/13/2015   by Dr. Arlee   TEE WITHOUT CARDIOVERSION N/A 04/21/2022   Procedure: TRANSESOPHAGEAL ECHOCARDIOGRAM (TEE);  Surgeon: Shlomo Wilbert SAUNDERS, MD;  Location: [facility] ENDOSCOPY;  Service: Cardiovascular;  Laterality: N/A;    SOCIAL HISTORY: Social History   Socioeconomic History   Marital status: Married    Spouse name: Not on file   Number of children: 2   Years of education: Not on file   Highest education level: Not on file  Occupational History   Not on file  Tobacco Use   Smoking status: Former    Types: Cigarettes   Smokeless tobacco: Former  Substance and Sexual Activity   Alcohol use: No    Alcohol/week: 0.0 standard drinks of alcohol   Drug use: No   Sexual activity: Not on file  Other Topics Concern   Not on file  Social History Narrative   Married, 2 children and 8 grand and 5 great   Social Drivers of Corporate investment banker Strain: Not on file  Food Insecurity: No Food Insecurity (06/06/2024)   Hunger Vital Sign    Worried About Running Out of Food in the Last Year: Never true    Ran Out of Food in the Last Year: Never true  Transportation Needs: No Transportation Needs (06/06/2024)   PRAPARE - Administrator, Civil Service (Medical): No  Lack of Transportation (Non-Medical): No  Physical Activity: Not on file  Stress: Not on file  Social Connections: Socially Integrated (06/06/2024)   Social Connection and Isolation Panel    Frequency of Communication with Friends and Family: More than three times a week    Frequency of Social Gatherings with Friends and Family: More than three times a week    Attends Religious  Services: More than 4 times per year    Active Member of [employer] or Organizations: Yes    Attends Engineer, structural: More than 4 times per year    Marital Status: Married  Catering manager Violence: Not At Risk (06/06/2024)   Humiliation, Afraid, Rape, and Kick questionnaire    Fear of Current or Ex-Partner: No    Emotionally Abused: No    Physically Abused: No    Sexually Abused: No    FAMILY HISTORY: Family History  Problem Relation Age of Onset   Diabetes Mother    Peripheral vascular disease Mother    Stroke Father    Breast cancer Daughter     ALLERGIES:  has no known allergies.  MEDICATIONS:  Current Facility-Administered Medications  Medication Dose Route Frequency Provider Last Rate Last Admin   0.9 %  sodium chloride  infusion  250 mL Intravenous Continuous Adefeso, Oladapo, DO   Held at 06/07/24 0840   0.9 %  sodium chloride  infusion  250 mL Intravenous Continuous Adefeso, Oladapo, DO   Stopped at 06/07/24 0840   acetaminophen  (TYLENOL ) tablet 650 mg  650 mg Oral Q6H PRN Leotis Bogus, MD   650 mg at 06/06/24 2204   Or   acetaminophen  (TYLENOL ) suppository 650 mg  650 mg Rectal Q6H PRN Leotis Bogus, MD       amiodarone (NEXTERONE PREMIX) 360-4.14 MG/200ML-% (1.8 mg/mL) IV infusion  60 mg/hr Intravenous Continuous Adefeso, Oladapo, DO 33.3 mL/hr at 06/07/24 1255 60 mg/hr at 06/07/24 1255   amiodarone (NEXTERONE PREMIX) 360-4.14 MG/200ML-% (1.8 mg/mL) IV infusion  60 mg/hr Intravenous Continuous Okey Vina GAILS, MD       azithromycin  (ZITHROMAX ) 500 mg in sodium chloride  0.9 % 250 mL IVPB  500 mg Intravenous Q24H Leotis Bogus, MD   Stopped at 06/07/24 1100   cefTRIAXone  (ROCEPHIN ) 1 g in sodium chloride  0.9 % 100 mL IVPB  1 g Intravenous Q24H Leotis Bogus, MD   Stopped at 06/07/24 0947   docusate sodium (COLACE) capsule 100 mg  100 mg Oral BID Leotis Bogus, MD   100 mg at 06/07/24 0910   norepinephrine (LEVOPHED) 4mg  in (0.016 mg/mL) premix  infusion  0-10 mcg/min Intravenous Titrated Adefeso, Oladapo, DO 11.25 mL/hr at 06/07/24 1255 3 mcg/min at 06/07/24 1255   ondansetron (ZOFRAN) tablet 4 mg  4 mg Oral Q6H PRN Leotis Bogus, MD       Or   ondansetron (ZOFRAN) injection 4 mg  4 mg Intravenous Q6H PRN Leotis Bogus, MD       rosuvastatin  (CRESTOR ) tablet 5 mg  5 mg Oral Daily Leotis Bogus, MD   5 mg at 06/07/24 0910    REVIEW OF SYSTEMS:   Review of Systems  Constitutional:  Positive for malaise/fatigue and weight loss.  Respiratory:  Positive for cough, sputum production and shortness of breath.   Cardiovascular:  Positive for chest pain.  Gastrointestinal:  Negative for abdominal pain, blood in stool, constipation and diarrhea.  Neurological:  Positive for weakness.     PHYSICAL EXAMINATION: ECOG PERFORMANCE STATUS: 2 - Symptomatic, <50% confined to  bed  Vitals:   06/07/24 1215 06/07/24 1245  BP: (!) 102/56 107/61  Pulse: (!) 120 87  Resp: 20 (!) 26  Temp:    SpO2: 100% 99%   Filed Weights   06/06/24 0738 06/06/24 1333  Weight: 130 lb (59 kg) 141 lb (64 kg)    GENERAL:alert, no distress and comfortable SKIN: skin color, texture, turgor are normal, no rashes or significant lesions EYES: normal, conjunctiva are pink and non-injected, sclera clear OROPHARYNX:no exudate, no erythema and lips, buccal mucosa, and tongue normal  NECK: supple, thyroid normal size, non-tender, without nodularity LYMPH:  no palpable lymphadenopathy in the cervical, axillary or inguinal LUNGS: clear to auscultation and percussion with normal breathing effort HEART: regular rate & rhythm and no murmurs and no lower extremity edema ABDOMEN:abdomen soft, non-tender and normal bowel sounds Musculoskeletal:no cyanosis of digits and no clubbing  PSYCH: alert & oriented x 3 with fluent speech NEURO: no focal motor/sensory deficits  LABORATORY DATA:  I have reviewed the data as listed Recent Results (from the past 2160 hours)   Lipase, blood     Status: None   Collection Time: 06/06/24  7:55 AM  Result Value Ref Range   Lipase 16 11 - 51 U/L    Comment: Performed at [facility], [address], [place], [place] [address]  Comprehensive metabolic panel     Status: Abnormal   Collection Time: 06/06/24  7:55 AM  Result Value Ref Range   Sodium 134 (L) 135 - 145 mmol/L   Potassium 3.9 3.5 - 5.1 mmol/L   Chloride 94 (L) 98 - 111 mmol/L   CO2 28 22 - 32 mmol/L   Glucose, Bld 126 (H) 70 - 99 mg/dL    Comment: Glucose reference range applies only to samples taken after fasting for at least 8 hours.   BUN 12 8 - 23 mg/dL   Creatinine, Ser 9.18 0.44 - 1.00 mg/dL   Calcium  9.5 8.9 - 10.3 mg/dL   Total Protein 7.7 6.5 - 8.1 g/dL   Albumin 4.0 3.5 - 5.0 g/dL   AST 21 15 - 41 U/L   ALT 8 0 - 44 U/L   Alkaline Phosphatase 87 38 - 126 U/L   Total Bilirubin 2.6 (H) 0.0 - 1.2 mg/dL   GFR, Estimated >39 >39 mL/min    Comment: (NOTE) Calculated using the CKD-EPI Creatinine Equation (2021)    Anion gap 13 5 - 15    Comment: Performed at [facility], [address] [place], [place] [address]  CBC     Status: None   Collection Time: 06/06/24  7:55 AM  Result Value Ref Range   WBC 6.8 4.0 - 10.5 K/uL   RBC 4.20 3.87 - 5.11 MIL/uL   Hemoglobin 13.2 12.0 - 15.0 g/dL   HCT 59.8 63.9 - 53.9 %   MCV 95.5 80.0 - 100.0 fL   MCH 31.4 26.0 - 34.0 pg   MCHC 32.9 30.0 - 36.0 g/dL   RDW 87.7 88.4 - 84.4 %   Platelets 215 150 - 400 K/uL   nRBC 0.0 0.0 - 0.2 %    Comment: Performed at [facility], [address] [place], [place] [address]  Troponin T, High Sensitivity     Status: Abnormal   Collection Time: 06/06/24  7:55 AM  Result Value Ref Range   Troponin T High Sensitivity 66 (H) 0 - 19 ng/L    Comment: (NOTE) Biotin concentrations > 1000 ng/mL falsely decrease TnT results.  Serial cardiac troponin measurements are suggested.  Refer to the Links section for chest pain algorithms and additional  guidance. Performed  at [facility], [address], [place], [place] [address]   Urinalysis, Routine w reflex microscopic -Urine, Clean Catch     Status: Abnormal   Collection Time: 06/06/24  8:28 AM  Result Value Ref Range   Color, Urine YELLOW YELLOW   APPearance CLEAR CLEAR   Specific Gravity, Urine 1.004 (L) 1.005 - 1.030   pH 6.0 5.0 - 8.0   Glucose, UA NEGATIVE NEGATIVE mg/dL   Hgb urine dipstick SMALL (A) NEGATIVE   Bilirubin Urine NEGATIVE NEGATIVE   Ketones, ur 5 (A) NEGATIVE mg/dL   Protein, ur NEGATIVE NEGATIVE mg/dL   Nitrite NEGATIVE NEGATIVE   Leukocytes,Ua NEGATIVE NEGATIVE   RBC / HPF 0-5 0 - 5 RBC/hpf   WBC, UA 0-5 0 - 5 WBC/hpf   Bacteria, UA NONE SEEN NONE SEEN   Squamous Epithelial / HPF 0-5 0 - 5 /HPF    Comment: Performed at [facility], [address] [place], [place] [address]  CK     Status: Abnormal   Collection Time: 06/06/24  8:31 AM  Result Value Ref Range   Total CK 36 (L) 38 - 234 U/L    Comment: Performed at [facility], [address], [place], [place] [address]  D-dimer, quantitative     Status: Abnormal   Collection Time: 06/06/24  8:31 AM  Result Value Ref Range   D-Dimer, Quant 9.25 (H) 0.00 - 0.50 ug/mL-FEU    Comment: (NOTE) At the manufacturer cut-off value of 0.5 g/mL FEU, this assay has a negative predictive value of 95-100%.This assay is intended for use in conjunction with a clinical pretest probability (PTP) assessment model to exclude pulmonary embolism (PE) and deep venous thrombosis (DVT) in outpatients suspected of PE or DVT. Results should be correlated with clinical presentation. Performed at [facility], [address], [place], [place] [address]   Protime-INR     Status: Abnormal   Collection Time: 06/06/24  8:31 AM  Result Value Ref Range   Prothrombin Time 18.4 (H) 11.4 - 15.2 seconds   INR 1.5 (H) 0.8 - 1.2    Comment: (NOTE) INR goal varies based on device and disease states. Performed at [facility], [address]  [place], [place] [address]   Hepatic function panel     Status: Abnormal   Collection Time: 06/06/24  8:34 AM  Result Value Ref Range   Total Protein 7.8 6.5 - 8.1 g/dL   Albumin 4.0 3.5 - 5.0 g/dL   AST 23 15 - 41 U/L   ALT 9 0 - 44 U/L   Alkaline Phosphatase 89 38 - 126 U/L   Total Bilirubin 2.6 (H) 0.0 - 1.2 mg/dL   Bilirubin, Direct 1.2 (H) 0.0 - 0.2 mg/dL   Indirect Bilirubin 1.4 (H) 0.3 - 0.9 mg/dL    Comment: Performed at [facility], [address], [place], [place] [address]  Resp panel by RT-PCR (RSV, Flu A&B, Covid) Anterior Nasal Swab     Status: None   Collection Time: 06/06/24  8:58 AM   Specimen: Anterior Nasal Swab  Result Value Ref Range   SARS Coronavirus 2 by RT PCR NEGATIVE NEGATIVE    Comment: (NOTE) SARS-CoV-2 target nucleic acids are NOT DETECTED.  The SARS-CoV-2 RNA is generally detectable in upper respiratory specimens during the acute phase of infection. The lowest concentration of SARS-CoV-2 viral copies this  assay can detect is 138 copies/mL. A negative result does not preclude SARS-Cov-2 infection and should not be used as the sole basis for treatment or other patient management decisions. A negative result may occur with  improper specimen collection/handling, submission of specimen other than nasopharyngeal swab, presence of viral mutation(s) within the areas targeted by this assay, and inadequate number of viral copies(<138 copies/mL). A negative result must be combined with clinical observations, patient history, and epidemiological information. The expected result is Negative.  Fact Sheet for Patients:  BloggerCourse.com  Fact Sheet for Healthcare Providers:  SeriousBroker.it  This test is no t yet approved or cleared by the [place]  FDA and  has been authorized for detection and/or diagnosis of SARS-CoV-2 by FDA under an Emergency Use Authorization (EUA). This EUA will remain  in effect  (meaning this test can be used) for the duration of the COVID-19 declaration under Section 564(b)(1) of the Act, 21 U.S.C.section 360bbb-3(b)(1), unless the authorization is terminated  or revoked sooner.       Influenza A by PCR NEGATIVE NEGATIVE   Influenza B by PCR NEGATIVE NEGATIVE    Comment: (NOTE) The Xpert Xpress SARS-CoV-2/FLU/RSV plus assay is intended as an aid in the diagnosis of influenza from Nasopharyngeal swab specimens and should not be used as a sole basis for treatment. Nasal washings and aspirates are unacceptable for Xpert Xpress SARS-CoV-2/FLU/RSV testing.  Fact Sheet for Patients: BloggerCourse.com  Fact Sheet for Healthcare Providers: SeriousBroker.it  This test is not yet approved or cleared by the [place]  FDA and has been authorized for detection and/or diagnosis of SARS-CoV-2 by FDA under an Emergency Use Authorization (EUA). This EUA will remain in effect (meaning this test can be used) for the duration of the COVID-19 declaration under Section 564(b)(1) of the Act, 21 U.S.C. section 360bbb-3(b)(1), unless the authorization is terminated or revoked.     Resp Syncytial Virus by PCR NEGATIVE NEGATIVE    Comment: (NOTE) Fact Sheet for Patients: BloggerCourse.com  Fact Sheet for Healthcare Providers: SeriousBroker.it  This test is not yet approved or cleared by the [place]  FDA and has been authorized for detection and/or diagnosis of SARS-CoV-2 by FDA under an Emergency Use Authorization (EUA). This EUA will remain in effect (meaning this test can be used) for the duration of the COVID-19 declaration under Section 564(b)(1) of the Act, 21 U.S.C. section 360bbb-3(b)(1), unless the authorization is terminated or revoked.  Performed at [facility], [address] [place], [place] [address]   Troponin T, High Sensitivity     Status:  Abnormal   Collection Time: 06/06/24  9:42 AM  Result Value Ref Range   Troponin T High Sensitivity 69 (H) 0 - 19 ng/L    Comment: (NOTE) Biotin concentrations > 1000 ng/mL falsely decrease TnT results.  Serial cardiac troponin measurements are suggested.  Refer to the Links section for chest pain algorithms and additional  guidance. Performed at [facility], [address], [place], [place] [address]   Lactic acid, plasma     Status: None   Collection Time: 06/06/24 11:23 AM  Result Value Ref Range   Lactic Acid, Venous 1.1 0.5 - 1.9 mmol/L    Comment: Performed at [facility], [address] [place], [place] [address]  Blood culture (routine x 2)     Status: None (Preliminary result)   Collection Time: 06/06/24 12:12 PM   Specimen: BLOOD  Result Value Ref Range   Specimen Description BLOOD BLOOD LEFT ARM  Special Requests      BOTTLES DRAWN AEROBIC AND ANAEROBIC Blood Culture adequate volume   Culture      NO GROWTH < 24 HOURS Performed at [facility], [address] [place], [place] [address]    Report Status PENDING   Lactic acid, plasma     Status: None   Collection Time: 06/06/24 12:31 PM  Result Value Ref Range   Lactic Acid, Venous 1.0 0.5 - 1.9 mmol/L    Comment: Performed at [facility], [address] [place], [place] [address]  Blood culture (routine x 2)     Status: None (Preliminary result)   Collection Time: 06/06/24 12:40 PM   Specimen: BLOOD  Result Value Ref Range   Specimen Description BLOOD BLOOD RIGHT HAND AEROBIC BOTTLE ONLY    Special Requests      Blood Culture results may not be optimal due to an inadequate volume of blood received in culture bottles BOTTLES DRAWN AEROBIC ONLY   Culture      NO GROWTH < 24 HOURS Performed at [facility], [address], [place], [place] [address]    Report Status PENDING   Comprehensive metabolic panel     Status: Abnormal   Collection Time: 06/07/24  4:51 AM  Result Value Ref Range   Sodium 135 135 - 145  mmol/L   Potassium 3.5 3.5 - 5.1 mmol/L   Chloride 96 (L) 98 - 111 mmol/L   CO2 26 22 - 32 mmol/L   Glucose, Bld 89 70 - 99 mg/dL    Comment: Glucose reference range applies only to samples taken after fasting for at least 8 hours.   BUN 10 8 - 23 mg/dL   Creatinine, Ser 9.36 0.44 - 1.00 mg/dL   Calcium  8.6 (L) 8.9 - 10.3 mg/dL   Total Protein 6.2 (L) 6.5 - 8.1 g/dL   Albumin 3.1 (L) 3.5 - 5.0 g/dL   AST 18 15 - 41 U/L   ALT 7 0 - 44 U/L   Alkaline Phosphatase 76 38 - 126 U/L   Total Bilirubin 1.5 (H) 0.0 - 1.2 mg/dL   GFR, Estimated >39 >39 mL/min    Comment: (NOTE) Calculated using the CKD-EPI Creatinine Equation (2021)    Anion gap 12 5 - 15    Comment: Performed at [facility], [address], [place], [place] [address]  CBC     Status: Abnormal   Collection Time: 06/07/24  4:51 AM  Result Value Ref Range   WBC 5.7 4.0 - 10.5 K/uL   RBC 3.76 (L) 3.87 - 5.11 MIL/uL   Hemoglobin 11.7 (L) 12.0 - 15.0 g/dL   HCT 63.9 63.9 - 53.9 %   MCV 95.7 80.0 - 100.0 fL   MCH 31.1 26.0 - 34.0 pg   MCHC 32.5 30.0 - 36.0 g/dL   RDW 87.7 88.4 - 84.4 %   Platelets 222 150 - 400 K/uL   nRBC 0.0 0.0 - 0.2 %    Comment: Performed at [facility], [address] [place], [place] [address]  Magnesium      Status: Abnormal   Collection Time: 06/07/24  4:51 AM  Result Value Ref Range   Magnesium  1.4 (L) 1.7 - 2.4 mg/dL    Comment: Performed at [facility], [address], [place], [place] [address]  Phosphorus     Status: None   Collection Time: 06/07/24  4:51 AM  Result Value Ref Range   Phosphorus 2.9 2.5 - 4.6 mg/dL  Comment: Performed at [facility], [address], [place], [place] [address]    RADIOGRAPHIC STUDIES: I have personally reviewed the radiological images as listed and agreed with the findings in the report. MR ABDOMEN WITH MRCP W CONTRAST Result Date: 06/06/2024 EXAM: MRCP WITH IV CONTRAST 06/06/2024 06:52:40 PM TECHNIQUE: Multisequence, multiplanar magnetic resonance  images of the abdomen with intravenous contrast. MRCP sequences were performed. 7 mL (gadobutrol (GADAVIST) 1 MMOL/ML injection 7 mL GADOBUTROL 1 MMOL/ML IV SOLN). COMPARISON: CT of earlier today. CLINICAL HISTORY: [id number] Liver mass 203554. Table formatting from the original note was not included.; Liver mass Liver mass. FINDINGS: LIMITATIONS/ARTIFACTS: Mild motion degradation throughout. LIVER: No cirrhosis. Segment 4a 3.8 x 5.1 cm ill-defined mass with delayed hyper-enhancement including on image 39 / 27. GALLBLADDER AND BILIARY SYSTEM: Cholecystectomy. Marked intrahepatic biliary duct dilatation including throughout the majority of the left hepatic lobe and minimally into segment 8, secondary to the segment 4a mass. Just upstream from the obstruction, an intraductal stone of 9 mm is identified included on image 19 / 4 and 9 / 3. No common duct dilatation. SPLEEN: Unremarkable. PANCREAS/PANCREATIC DUCT: Visualized pancreas is unremarkable. No pancreatic ductal dilatation. ADRENAL GLANDS: Unremarkable. KIDNEYS: Bilateral nonenhancing renal lesions of up to renal 2.8 cm are likely cysts and do not warrant imaging follow up. Mild left renal cortical thinning. LYMPH NODES: Right Cardiophrenic Angle adenopathy including at 1.0 cm on image 33 / 27. No enlarged abdominal lymph nodes. VASCULATURE: Advanced aortic atherosclerosis. Patent portal and splenic veins. PERITONEUM: Small volume perihepatic ascites. ABDOMINAL WALL: No hernia. No mass. BONES: Moderate convex right lumbar spine curvature. No acute abnormality or worrisome osseous lesion. SOFT TISSUES: Moderate right hemidiaphragm elevation. Small bilateral pleural effusions. MISCELLANEOUS: Unremarkable. IMPRESSION: 1. 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 Metastatic disease from colon cancer could have a similar appearance  but is felt less likely. 2.  intraductal 9 mm stone just upstream from the obstructive mass 3. tiny bilateral pleural effusions and small volume perihepatic ascites. 4.  right Cardiophrenic Angle adenopathy, again suspicious for nodal metastasis. 5.  mild motion degradation throughout. Electronically signed by: Rockey Kilts MD 06/06/2024 07:22 PM EDT RP Workstation: [id number]   MR 3D Recon At Scanner Result Date: 06/06/2024 EXAM: MRCP WITH IV CONTRAST 06/06/2024 06:52:40 PM TECHNIQUE: Multisequence, multiplanar magnetic resonance images of the abdomen with intravenous contrast. MRCP sequences were performed. 7 mL (gadobutrol (GADAVIST) 1 MMOL/ML injection 7 mL GADOBUTROL 1 MMOL/ML IV SOLN). COMPARISON: CT of earlier today. CLINICAL HISTORY: [id number] Liver mass [id number]. Table formatting from the original note was not included.; Liver mass Liver mass. FINDINGS: LIMITATIONS/ARTIFACTS: Mild motion degradation throughout. LIVER: No cirrhosis. Segment 4a 3.8 x 5.1 cm ill-defined mass with delayed hyper-enhancement including on image 39 / 27. GALLBLADDER AND BILIARY SYSTEM: Cholecystectomy. Marked intrahepatic biliary duct dilatation including throughout the majority of the left hepatic lobe and minimally into segment 8, secondary to the segment 4a mass. Just upstream from the obstruction, an intraductal stone of 9 mm is identified included on image 19 / 4 and 9 / 3. No common duct dilatation. SPLEEN: Unremarkable. PANCREAS/PANCREATIC DUCT: Visualized pancreas is unremarkable. No pancreatic ductal dilatation. ADRENAL GLANDS: Unremarkable. KIDNEYS: Bilateral nonenhancing renal lesions of up to renal 2.8 cm are likely cysts and do not warrant imaging follow up. Mild left renal cortical thinning. LYMPH NODES: Right Cardiophrenic Angle adenopathy including at 1.0 cm on image 33 / 27.  No enlarged abdominal lymph nodes. VASCULATURE: Advanced aortic atherosclerosis. Patent portal and splenic veins. PERITONEUM: Small volume  perihepatic ascites. ABDOMINAL WALL: No hernia. No mass. BONES: Moderate convex right lumbar spine curvature. No acute abnormality or worrisome osseous lesion. SOFT TISSUES: Moderate right hemidiaphragm elevation. Small bilateral pleural effusions. MISCELLANEOUS: Unremarkable. IMPRESSION: 1. 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 Metastatic disease from colon cancer could have a similar appearance but is felt less likely. 2.  intraductal 9 mm stone just upstream from the obstructive mass 3. tiny bilateral pleural effusions and small volume perihepatic ascites. 4.  right Cardiophrenic Angle adenopathy, again suspicious for nodal metastasis. 5.  mild motion degradation throughout. Electronically signed by: Rockey Kilts MD 06/06/2024 07:22 PM EDT RP Workstation: [id number]   CT ABDOMEN PELVIS W CONTRAST Result Date: 06/06/2024 CLINICAL DATA:  PE suspected, cough, vomiting * Tracking Code: BO * EXAM: CT ANGIOGRAPHY CHEST CT ABDOMEN AND PELVIS WITH CONTRAST TECHNIQUE: Multidetector CT imaging of the chest was performed using the standard protocol during bolus administration of intravenous contrast. Multiplanar CT image reconstructions and MIPs were obtained to evaluate the vascular anatomy. Multidetector CT imaging of the abdomen and pelvis was performed using the standard protocol during bolus administration of intravenous contrast. RADIATION DOSE REDUCTION: This exam was performed according to the departmental dose-optimization program which includes automated exposure control, adjustment of the mA and/or kV according to patient size and/or use of iterative reconstruction technique. CONTRAST:  75mL OMNIPAQUE IOHEXOL 350 MG/ML SOLN COMPARISON:  None Available. FINDINGS: CT CHEST ANGIOGRAM FINDINGS Cardiovascular: Satisfactory opacification of the pulmonary arteries to the  segmental level. No evidence of pulmonary embolism. Normal heart size. Three-vessel coronary artery calcifications. Enlargement of the main pulmonary artery measuring up to 3.6 cm in caliber. No pericardial effusion. Severe aortic atherosclerosis. Mediastinum/Nodes: Enlarged right cardiophrenic angle lymph nodes measuring up to 1.4 x 1.1 cm (series 2, image 16). No other enlarged mediastinal, hilar, or axillary lymph nodes. Thyroid gland, trachea, and esophagus demonstrate no significant findings. Lungs/Pleura: Diffuse bilateral bronchial wall thickening. Extensive clustered nodular airspace opacity throughout the lungs, particularly in the posterior upper lobes (series 6, image 45) although with several more discrete nodular appearing opacities throughout the lungs, for example in the peripheral anterior right lower lobe measuring 1.1 x 0.9 cm (series 6, image 98). No pleural effusion or pneumothorax. Musculoskeletal: No chest wall abnormality. No acute osseous findings. Review of the MIP images confirms the above findings. CT ABDOMEN PELVIS FINDINGS Hepatobiliary: Large, rim enhancing mass in the left lobe of the liver, centered in hepatic segment IV, measuring 5.7 x 4.8 cm with associated segmental biliary ductal obstruction and atrophy of the left lobe (series 2, image 21). No gallstones, gallbladder wall thickening, or extrahepatic biliary dilatation. Pancreas: Unremarkable. No pancreatic ductal dilatation or surrounding inflammatory changes. Spleen: Normal in size without significant abnormality. Adrenals/Urinary Tract: Adrenal glands are unremarkable. Simple, benign bilateral renal cortical cysts. Kidneys are otherwise normal, without renal calculi, solid lesion, or hydronephrosis. Bladder is unremarkable. Stomach/Bowel: Stomach is within normal limits. Appendix appears normal. No evidence of bowel wall thickening, distention, or inflammatory changes. Descending and sigmoid diverticulosis. Vascular/Lymphatic:  Severe aortic atherosclerosis. No enlarged abdominal or pelvic lymph nodes. Reproductive: No mass or other significant abnormality. Other: No abdominal wall hernia or abnormality. Small volume perihepatic ascites. Musculoskeletal: No acute or significant osseous findings. IMPRESSION: 1. Negative examination for pulmonary embolism. 2. Large, rim enhancing mass  in the left lobe of the liver, centered in hepatic segment IV, measuring 5.7 x 4.8 cm with associated segmental biliary ductal obstruction and atrophy of the left lobe. 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 3. Enlarged right cardiophrenic angle lymph nodes, highly suspicious for nodal metastatic disease. 4. Small volume perihepatic ascites. 5. Extensive clustered nodular airspace opacity throughout the lungs, particularly in the posterior upper lobes, with several more discrete nodular appearing opacities throughout the lungs. There is some component of atypical infection, however small pulmonary metastases amongst these airspace opacities very difficult to confidently exclude. 6. Diffuse bilateral bronchial wall thickening, consistent with nonspecific infectious or inflammatory bronchitis. 7. Enlargement of the main pulmonary artery, as can be seen in pulmonary hypertension. 8. Coronary artery disease. Aortic Atherosclerosis (ICD10-I70.0). Electronically Signed   By: Marolyn JONETTA Jaksch M.D.   On: 06/06/2024 10:17   CT Angio Chest PE W and/or Wo Contrast Result Date: 06/06/2024 CLINICAL DATA:  PE suspected, cough, vomiting * Tracking Code: BO * EXAM: CT ANGIOGRAPHY CHEST CT ABDOMEN AND PELVIS WITH CONTRAST TECHNIQUE: Multidetector CT imaging of the chest was performed using the standard protocol during bolus administration of intravenous contrast. Multiplanar CT image reconstructions and MIPs were obtained to evaluate the  vascular anatomy. Multidetector CT imaging of the abdomen and pelvis was performed using the standard protocol during bolus administration of intravenous contrast. RADIATION DOSE REDUCTION: This exam was performed according to the departmental dose-optimization program which includes automated exposure control, adjustment of the mA and/or kV according to patient size and/or use of iterative reconstruction technique. CONTRAST:  75mL OMNIPAQUE IOHEXOL 350 MG/ML SOLN COMPARISON:  None Available. FINDINGS: CT CHEST ANGIOGRAM FINDINGS Cardiovascular: Satisfactory opacification of the pulmonary arteries to the segmental level. No evidence of pulmonary embolism. Normal heart size. Three-vessel coronary artery calcifications. Enlargement of the main pulmonary artery measuring up to 3.6 cm in caliber. No pericardial effusion. Severe aortic atherosclerosis. Mediastinum/Nodes: Enlarged right cardiophrenic angle lymph nodes measuring up to 1.4 x 1.1 cm (series 2, image 16). No other enlarged mediastinal, hilar, or axillary lymph nodes. Thyroid gland, trachea, and esophagus demonstrate no significant findings. Lungs/Pleura: Diffuse bilateral bronchial wall thickening. Extensive clustered nodular airspace opacity throughout the lungs, particularly in the posterior upper lobes (series 6, image 45) although with several more discrete nodular appearing opacities throughout the lungs, for example in the peripheral anterior right lower lobe measuring 1.1 x 0.9 cm (series 6, image 98). No pleural effusion or pneumothorax. Musculoskeletal: No chest wall abnormality. No acute osseous findings. Review of the MIP images confirms the above findings. CT ABDOMEN PELVIS FINDINGS Hepatobiliary: Large, rim enhancing mass in the left lobe of the liver, centered in hepatic segment IV, measuring 5.7 x 4.8 cm with associated segmental biliary ductal obstruction and atrophy of the left lobe (series 2, image 21). No gallstones, gallbladder wall  thickening, or extrahepatic biliary dilatation. Pancreas: Unremarkable. No pancreatic ductal dilatation or surrounding inflammatory changes. Spleen: Normal in size without significant abnormality. Adrenals/Urinary Tract: Adrenal glands are unremarkable. Simple, benign bilateral renal cortical cysts. Kidneys are otherwise normal, without renal calculi, solid lesion, or hydronephrosis. Bladder is unremarkable. Stomach/Bowel: Stomach is within normal limits. Appendix appears normal. No evidence of bowel wall thickening, distention, or inflammatory changes. Descending and sigmoid diverticulosis. Vascular/Lymphatic: Severe aortic atherosclerosis. No enlarged abdominal or pelvic lymph nodes. Reproductive: No mass or other significant abnormality. Other: No abdominal wall hernia or abnormality. Small volume perihepatic  ascites. Musculoskeletal: No acute or significant osseous findings. IMPRESSION: 1. Negative examination for pulmonary embolism. 2. Large, rim enhancing mass in the left lobe of the liver, centered in hepatic segment IV, measuring 5.7 x 4.8 cm with associated segmental biliary ductal obstruction and atrophy of the left lobe. 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 3. Enlarged right cardiophrenic angle lymph nodes, highly suspicious for nodal metastatic disease. 4. Small volume perihepatic ascites. 5. Extensive clustered nodular airspace opacity throughout the lungs, particularly in the posterior upper lobes, with several more discrete nodular appearing opacities throughout the lungs. There is some component of atypical infection, however small pulmonary metastases amongst these airspace opacities very difficult to confidently exclude. 6. Diffuse bilateral bronchial wall thickening, consistent with nonspecific infectious or inflammatory bronchitis. 7. Enlargement of the  main pulmonary artery, as can be seen in pulmonary hypertension. 8. Coronary artery disease. Aortic Atherosclerosis (ICD10-I70.0). Electronically Signed   By: Marolyn JONETTA Jaksch M.D.   On: 06/06/2024 10:17    ASSESSMENT & PLAN:  Liver mass Patient presented to the ED with abdominal pain and found to have  large, rim enhancing mass in the left lobe of the liver, centered in hepatic segment IV, measuring 5.7 x 4.8 cm with associated segmental biliary ductal obstruction and atrophy of the left lobe, No gallstones, gallbladder wall thickening, or extrahepatic biliary dilatation. Recommended MRCP to further characterize lesion. 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 Metastatic disease from colon cancer could have a similar appearance but is felt less likely. Tumor markers are still pending. We have discussed case with GI who is on board with a liver biopsy.  Unfortunately, she is on a blood thinner Eliquis  for atrial fibrillation. Last dose was this morning.  We we will have to wait 48 hours from last dose prior to liver biopsy to bridge.  At this point, liver biopsy will be scheduled on Thursday or as an outpatient if she is discharged prior. Will need biopsy prior to visit at the cancer center.  Anticipate her being seen within 1 to 2 weeks after liver biopsy.  Atrial fibrillation with RVR (HCC) Developed A-fib with RVR overnight.  She is currently on amiodarone drip.  Heart rate is still elevated. She is feeling slightly better. Stabilized prior to biopsy.   PLAN: Follow-up on tumor markers. Continue antibiotics. Will need liver biopsy at some point.  Obviously we will wait until she is stable given recent A-fib with RVR and she will need to be off her Eliquis  for 48 hours.  Plan per GI is to transition from Eliquis  to heparin in preparation  for potential liver biopsy. Oncology will see her outpatient 1 to 2 weeks postbiopsy to discuss results.   All questions were answered. The patient knows to call the clinic with any problems, questions or concerns. I spent 30 minutes counseling the patient face to face. The total time spent in the appointment was 20 minutes and more than 50% was on counseling.     Delon FORBES Hope, NP 06/07/24 1:11 PM

## 2024-06-07 NOTE — Assessment & Plan Note (Signed)
 Developed A-fib with RVR overnight.  She is currently on amiodarone drip.  Heart rate is still elevated. She is feeling slightly better. Stabilized prior to biopsy.

## 2024-06-07 NOTE — Telephone Encounter (Signed)
 Called and made Jerel, Daughter aware per Dr. Lavona she has to talk to the hospitalist who is taking care of her and see if he/she will transfer her to their service in [facility]. Understanding verbalized.

## 2024-06-07 NOTE — Progress Notes (Signed)
 Gastroenterology Progress Note   Referring Provider: No ref. provider found Primary Care Physician:  [name], MD Primary Gastroenterologist:  unassigned, Dr. Cindie  Patient ID: Gina Duncan; [id number]; March 11, 1941   Subjective:    Feels ok right now. No chest pain or shortness of breath. Events of early morning noted. She denies palpatations at the moment. Granddaughter at bedside. Husband presents during evaluation. Patient passing gas. Family is working on getting patient transferred to [facility] She wants to talk with her family before committing to liver biopsy.   She reports poor appetite for a week or more. Complains of early satiety. Nausea.   Objective:   Vital signs in last 24 hours: Temp:  [97.5 F (36.4 C)-99.7 F (37.6 C)] 98.6 F (37 C) (10/07 1104) Pulse Rate:  [34-176] 116 (10/07 1015) Resp:  [19-39] 20 (10/07 1015) BP: (75-194)/(29-109) 87/39 (10/07 1015) SpO2:  [90 %-100 %] 99 % (10/07 1015) Weight:  [35 kg] 64 kg (10/06 1333) Last BM Date : 06/05/24 General:   Alert, acute ill appearing, pale, slightly short of breath with talking. Pleasant and cooperative Head:  Normocephalic and atraumatic. Eyes:  Sclera clear, no icterus.   Abdomen:  Soft, nontender and nondistended.   Normal bowel sounds, without guarding, and without rebound.   Extremities:  Without clubbing, deformity or edema. Neurologic:  Alert and  oriented x4;  grossly normal neurologically. Skin:  Intact without significant lesions or rashes. Psych:  Alert and cooperative. Normal mood and affect.  Intake/Output from previous day: 10/06 0701 - 10/07 0700 In: 2079.9 [P.O.:600; I.V.:406; IV Piggyback:1073.9] Out: 1 [Urine:1] Intake/Output this shift: Total I/O In: 794.5 [I.V.:264.6; IV Piggyback:529.9] Out: -   Lab Results: CBC Recent Labs    06/06/24 0755 06/07/24 0451  WBC 6.8 5.7  HGB 13.2 11.7*  HCT 40.1 36.0  MCV 95.5 95.7  PLT 215 222   BMET Recent Labs     06/06/24 0755 06/07/24 0451  NA 134* 135  K 3.9 3.5  CL 94* 96*  CO2 28 26  GLUCOSE 126* 89  BUN 12 10  CREATININE 0.81 0.63  CALCIUM  9.5 8.6*   LFTs Recent Labs    06/06/24 0755 06/06/24 0834 06/07/24 0451  BILITOT 2.6* 2.6* 1.5*  BILIDIR  --  1.2*  --   IBILI  --  1.4*  --   ALKPHOS 87 89 76  AST 21 23 18   ALT 8 9 7   PROT 7.7 7.8 6.2*  ALBUMIN 4.0 4.0 3.1*   Recent Labs    06/06/24 0755  LIPASE 16   PT/INR Recent Labs    06/06/24 0831  LABPROT 18.4*  INR 1.5*         Imaging Studies: MR ABDOMEN WITH MRCP W CONTRAST Result Date: 06/06/2024 EXAM: MRCP WITH IV CONTRAST 06/06/2024 06:52:40 PM TECHNIQUE: Multisequence, multiplanar magnetic resonance images of the abdomen with intravenous contrast. MRCP sequences were performed. 7 mL (gadobutrol (GADAVIST) 1 MMOL/ML injection 7 mL GADOBUTROL 1 MMOL/ML IV SOLN). COMPARISON: CT of earlier today. CLINICAL HISTORY: [id number] Liver mass 203554. Table formatting from the original note was not included.; Liver mass Liver mass. FINDINGS: LIMITATIONS/ARTIFACTS: Mild motion degradation throughout. LIVER: No cirrhosis. Segment 4a 3.8 x 5.1 cm ill-defined mass with delayed hyper-enhancement including on image 39 / 27. GALLBLADDER AND BILIARY SYSTEM: Cholecystectomy. Marked intrahepatic biliary duct dilatation including throughout the majority of the left hepatic lobe and minimally into segment 8, secondary to the segment 4a mass. Just upstream from the obstruction,  an intraductal stone of 9 mm is identified included on image 19 / 4 and 9 / 3. No common duct dilatation. SPLEEN: Unremarkable. PANCREAS/PANCREATIC DUCT: Visualized pancreas is unremarkable. No pancreatic ductal dilatation. ADRENAL GLANDS: Unremarkable. KIDNEYS: Bilateral nonenhancing renal lesions of up to renal 2.8 cm are likely cysts and do not warrant imaging follow up. Mild left renal cortical thinning. LYMPH NODES: Right Cardiophrenic Angle adenopathy including at 1.0 cm on  image 33 / 27. No enlarged abdominal lymph nodes. VASCULATURE: Advanced aortic atherosclerosis. Patent portal and splenic veins. PERITONEUM: Small volume perihepatic ascites. ABDOMINAL WALL: No hernia. No mass. BONES: Moderate convex right lumbar spine curvature. No acute abnormality or worrisome osseous lesion. SOFT TISSUES: Moderate right hemidiaphragm elevation. Small bilateral pleural effusions. MISCELLANEOUS: Unremarkable. IMPRESSION: 1. 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 Metastatic disease from colon cancer could have a similar appearance but is felt less likely. 2.  intraductal 9 mm stone just upstream from the obstructive mass 3. tiny bilateral pleural effusions and small volume perihepatic ascites. 4.  right Cardiophrenic Angle adenopathy, again suspicious for Duncan metastasis. 5.  mild motion degradation throughout. Electronically signed by: Rockey Kilts MD 06/06/2024 07:22 PM EDT RP Workstation: [id number]   MR 3D Recon At Scanner Result Date: 06/06/2024 EXAM: MRCP WITH IV CONTRAST 06/06/2024 06:52:40 PM TECHNIQUE: Multisequence, multiplanar magnetic resonance images of the abdomen with intravenous contrast. MRCP sequences were performed. 7 mL (gadobutrol (GADAVIST) 1 MMOL/ML injection 7 mL GADOBUTROL 1 MMOL/ML IV SOLN). COMPARISON: CT of earlier today. CLINICAL HISTORY: [id number] Liver mass 203554. Table formatting from the original note was not included.; Liver mass Liver mass. FINDINGS: LIMITATIONS/ARTIFACTS: Mild motion degradation throughout. LIVER: No cirrhosis. Segment 4a 3.8 x 5.1 cm ill-defined mass with delayed hyper-enhancement including on image 39 / 27. GALLBLADDER AND BILIARY SYSTEM: Cholecystectomy. Marked intrahepatic biliary duct dilatation including throughout the majority of the left hepatic lobe and minimally into segment 8, secondary to the  segment 4a mass. Just upstream from the obstruction, an intraductal stone of 9 mm is identified included on image 19 / 4 and 9 / 3. No common duct dilatation. SPLEEN: Unremarkable. PANCREAS/PANCREATIC DUCT: Visualized pancreas is unremarkable. No pancreatic ductal dilatation. ADRENAL GLANDS: Unremarkable. KIDNEYS: Bilateral nonenhancing renal lesions of up to renal 2.8 cm are likely cysts and do not warrant imaging follow up. Mild left renal cortical thinning. LYMPH NODES: Right Cardiophrenic Angle adenopathy including at 1.0 cm on image 33 / 27. No enlarged abdominal lymph nodes. VASCULATURE: Advanced aortic atherosclerosis. Patent portal and splenic veins. PERITONEUM: Small volume perihepatic ascites. ABDOMINAL WALL: No hernia. No mass. BONES: Moderate convex right lumbar spine curvature. No acute abnormality or worrisome osseous lesion. SOFT TISSUES: Moderate right hemidiaphragm elevation. Small bilateral pleural effusions. MISCELLANEOUS: Unremarkable. IMPRESSION: 1. 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 Metastatic disease from colon cancer could have a similar appearance but is felt less likely. 2.  intraductal 9 mm stone just upstream from the obstructive mass 3. tiny bilateral pleural effusions and small volume perihepatic ascites. 4.  right Cardiophrenic Angle adenopathy, again suspicious for Duncan metastasis. 5.  mild motion degradation throughout. Electronically signed by: Rockey Kilts MD 06/06/2024 07:22 PM EDT RP Workstation: [id number]   CT ABDOMEN PELVIS W CONTRAST Result Date: 06/06/2024 CLINICAL DATA:  PE suspected, cough, vomiting * Tracking Code: BO * EXAM:  CT ANGIOGRAPHY CHEST CT ABDOMEN AND PELVIS WITH CONTRAST TECHNIQUE: Multidetector CT imaging of the chest was performed using the standard protocol during bolus administration of intravenous contrast.  Multiplanar CT image reconstructions and MIPs were obtained to evaluate the vascular anatomy. Multidetector CT imaging of the abdomen and pelvis was performed using the standard protocol during bolus administration of intravenous contrast. RADIATION DOSE REDUCTION: This exam was performed according to the departmental dose-optimization program which includes automated exposure control, adjustment of the mA and/or kV according to patient size and/or use of iterative reconstruction technique. CONTRAST:  75mL OMNIPAQUE IOHEXOL 350 MG/ML SOLN COMPARISON:  None Available. FINDINGS: CT CHEST ANGIOGRAM FINDINGS Cardiovascular: Satisfactory opacification of the pulmonary arteries to the segmental level. No evidence of pulmonary embolism. Normal heart size. Three-vessel coronary artery calcifications. Enlargement of the main pulmonary artery measuring up to 3.6 cm in caliber. No pericardial effusion. Severe aortic atherosclerosis. Mediastinum/Nodes: Enlarged right cardiophrenic angle lymph nodes measuring up to 1.4 x 1.1 cm (series 2, image 16). No other enlarged mediastinal, hilar, or axillary lymph nodes. Thyroid gland, trachea, and esophagus demonstrate no significant findings. Lungs/Pleura: Diffuse bilateral bronchial wall thickening. Extensive clustered nodular airspace opacity throughout the lungs, particularly in the posterior upper lobes (series 6, image 45) although with several more discrete nodular appearing opacities throughout the lungs, for example in the peripheral anterior right lower lobe measuring 1.1 x 0.9 cm (series 6, image 98). No pleural effusion or pneumothorax. Musculoskeletal: No chest wall abnormality. No acute osseous findings. Review of the MIP images confirms the above findings. CT ABDOMEN PELVIS FINDINGS Hepatobiliary: Large, rim enhancing mass in the left lobe of the liver, centered in hepatic segment IV, measuring 5.7 x 4.8 cm with associated segmental biliary ductal obstruction and atrophy  of the left lobe (series 2, image 21). No gallstones, gallbladder wall thickening, or extrahepatic biliary dilatation. Pancreas: Unremarkable. No pancreatic ductal dilatation or surrounding inflammatory changes. Spleen: Normal in size without significant abnormality. Adrenals/Urinary Tract: Adrenal glands are unremarkable. Simple, benign bilateral renal cortical cysts. Kidneys are otherwise normal, without renal calculi, solid lesion, or hydronephrosis. Bladder is unremarkable. Stomach/Bowel: Stomach is within normal limits. Appendix appears normal. No evidence of bowel wall thickening, distention, or inflammatory changes. Descending and sigmoid diverticulosis. Vascular/Lymphatic: Severe aortic atherosclerosis. No enlarged abdominal or pelvic lymph nodes. Reproductive: No mass or other significant abnormality. Other: No abdominal wall hernia or abnormality. Small volume perihepatic ascites. Musculoskeletal: No acute or significant osseous findings. IMPRESSION: 1. Negative examination for pulmonary embolism. 2. Large, rim enhancing mass in the left lobe of the liver, centered in hepatic segment IV, measuring 5.7 x 4.8 cm with associated segmental biliary ductal obstruction and atrophy of the left lobe. 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 3. Enlarged right cardiophrenic angle lymph nodes, highly suspicious for Duncan metastatic disease. 4. Small volume perihepatic ascites. 5. Extensive clustered nodular airspace opacity throughout the lungs, particularly in the posterior upper lobes, with several more discrete nodular appearing opacities throughout the lungs. There is some component of atypical infection, however small pulmonary metastases amongst these airspace opacities very difficult to confidently exclude. 6. Diffuse bilateral bronchial wall thickening, consistent with  nonspecific infectious or inflammatory bronchitis. 7. Enlargement of the main pulmonary artery, as can be seen in pulmonary hypertension. 8. Coronary artery disease. Aortic Atherosclerosis (ICD10-I70.0). Electronically Signed   By: Marolyn JONETTA Jaksch M.D.   On: 06/06/2024 10:17   CT Angio Chest  PE W and/or Wo Contrast Result Date: 06/06/2024 CLINICAL DATA:  PE suspected, cough, vomiting * Tracking Code: BO * EXAM: CT ANGIOGRAPHY CHEST CT ABDOMEN AND PELVIS WITH CONTRAST TECHNIQUE: Multidetector CT imaging of the chest was performed using the standard protocol during bolus administration of intravenous contrast. Multiplanar CT image reconstructions and MIPs were obtained to evaluate the vascular anatomy. Multidetector CT imaging of the abdomen and pelvis was performed using the standard protocol during bolus administration of intravenous contrast. RADIATION DOSE REDUCTION: This exam was performed according to the departmental dose-optimization program which includes automated exposure control, adjustment of the mA and/or kV according to patient size and/or use of iterative reconstruction technique. CONTRAST:  75mL OMNIPAQUE IOHEXOL 350 MG/ML SOLN COMPARISON:  None Available. FINDINGS: CT CHEST ANGIOGRAM FINDINGS Cardiovascular: Satisfactory opacification of the pulmonary arteries to the segmental level. No evidence of pulmonary embolism. Normal heart size. Three-vessel coronary artery calcifications. Enlargement of the main pulmonary artery measuring up to 3.6 cm in caliber. No pericardial effusion. Severe aortic atherosclerosis. Mediastinum/Nodes: Enlarged right cardiophrenic angle lymph nodes measuring up to 1.4 x 1.1 cm (series 2, image 16). No other enlarged mediastinal, hilar, or axillary lymph nodes. Thyroid gland, trachea, and esophagus demonstrate no significant findings. Lungs/Pleura: Diffuse bilateral bronchial wall thickening. Extensive clustered nodular airspace opacity throughout the lungs, particularly in  the posterior upper lobes (series 6, image 45) although with several more discrete nodular appearing opacities throughout the lungs, for example in the peripheral anterior right lower lobe measuring 1.1 x 0.9 cm (series 6, image 98). No pleural effusion or pneumothorax. Musculoskeletal: No chest wall abnormality. No acute osseous findings. Review of the MIP images confirms the above findings. CT ABDOMEN PELVIS FINDINGS Hepatobiliary: Large, rim enhancing mass in the left lobe of the liver, centered in hepatic segment IV, measuring 5.7 x 4.8 cm with associated segmental biliary ductal obstruction and atrophy of the left lobe (series 2, image 21). No gallstones, gallbladder wall thickening, or extrahepatic biliary dilatation. Pancreas: Unremarkable. No pancreatic ductal dilatation or surrounding inflammatory changes. Spleen: Normal in size without significant abnormality. Adrenals/Urinary Tract: Adrenal glands are unremarkable. Simple, benign bilateral renal cortical cysts. Kidneys are otherwise normal, without renal calculi, solid lesion, or hydronephrosis. Bladder is unremarkable. Stomach/Bowel: Stomach is within normal limits. Appendix appears normal. No evidence of bowel wall thickening, distention, or inflammatory changes. Descending and sigmoid diverticulosis. Vascular/Lymphatic: Severe aortic atherosclerosis. No enlarged abdominal or pelvic lymph nodes. Reproductive: No mass or other significant abnormality. Other: No abdominal wall hernia or abnormality. Small volume perihepatic ascites. Musculoskeletal: No acute or significant osseous findings. IMPRESSION: 1. Negative examination for pulmonary embolism. 2. Large, rim enhancing mass in the left lobe of the liver, centered in hepatic segment IV, measuring 5.7 x 4.8 cm with associated segmental biliary ductal obstruction and atrophy of the left lobe. Findings are consistent with malignancy, differential considerations including both primary hepatic  cholangiocarcinoma or renal cell carcinoma or alternately a solitary metastasis. 3. Enlarged right cardiophrenic angle lymph nodes, highly suspicious for Duncan metastatic disease. 4. Small volume perihepatic ascites. 5. Extensive clustered nodular airspace opacity throughout the lungs, particularly in the posterior upper lobes, with several more discrete nodular appearing opacities throughout the lungs. There is some component of atypical infection, however small pulmonary metastases amongst these airspace opacities very difficult to confidently exclude. 6. Diffuse bilateral bronchial wall thickening, consistent with nonspecific infectious or inflammatory bronchitis. 7. Enlargement of the main pulmonary artery, as can be seen in pulmonary hypertension. 8. Coronary artery disease. Aortic Atherosclerosis (  ICD10-I70.0). Electronically Signed   By: Marolyn JONETTA Jaksch M.D.   On: 06/06/2024 10:17  [2 weeks]  Assessment:   Gina Duncan is an 83 y.o. year old female with past medical history significant for afib on Eliquis , CAD, anxiety, HTN, HF, endometrial cancer in the past, presenting to the ED with constellation of symptoms and concern for SIRS, CTA negative for PE, CT and CTA revealing large enhancing mass in left lobe of liver, segment IV, 5.7 by 4.8 cm with associated segmental biliary ductal obstruction and atrophy of left lobe, no extrahepatic biliary dilatation, pancreas unremarkable, concern for possible lung metastatic disease. GI consulted to assist with management. Oncology has also been requested.    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  Metastatic disease from colon cancer could also have similar appearance but felt to be less likely.  Intraductal 9 mm stone just upstream from the obstructive mass.  Right cardiophrenic angle adenopathy, again suspicious for Duncan metastasis.    AFP, CEA, CA 19-9 ordered by Oncology, results pending.    Total bilirubin down today to 1.5, other LFTs remain normal.  White blood cell count 5700.   Recommending liver biopsy for tissue diagnosis.  Patient developed A-fib with RVR, hypotension earlier this morning now on amiodarone drip and Levophed.  Last dose of Eliquis  at 918 this morning.  Family is requesting transfer to [facility].  Plan:   Follow-up on tumor markers. Trend LFTs. Monitor for any signs of cholangitis.  Currently patient is on azithromycin  and Rocephin . Discussed at length regarding liver biopsy which would be done in [place].  Patient would like to discuss further with her family before deciding about pursuing.  Once she has made her decision if she is agreeable we will place the order to make interventional radiology aware and we can start the process. Consider transitioning to heparin from Eliquis  in preparation for potential liver biopsy, management per attending.   LOS: 1 day   Sonny RAMAN. Ezzard RIGGERS [facility] [phone] 10/7/202512:32 PM

## 2024-06-07 NOTE — Progress Notes (Signed)
   06/07/24 0622  Assess: MEWS Score  BP (!) 103/38  MAP (mmHg) (!) 52  Pulse Rate 97  ECG Heart Rate (!) 188  Assess: MEWS Score  MEWS Temp 0  MEWS Systolic 0  MEWS Pulse 3  MEWS RR 1  MEWS LOC 0  MEWS Score 4  MEWS Score Color Red  Assess: SIRS CRITERIA  SIRS Temperature  0  SIRS Respirations  1  SIRS Pulse 1  SIRS WBC 0  SIRS Score Sum  2   Rapid response called. Dr Adefeso at bed side. Patient complaining of palpitations. See new orders

## 2024-06-07 NOTE — Progress Notes (Signed)
 Rapid Response  Rapid response was called due to patient going into paroxysmal atrial fibrillation (she has a history of atrial fibrillation).  At bedside, she was alert and oriented x 3 and presented with palpitations.  HR 150s to 160s.  BP was soft at 84/34.  IV LR 1 L bolus was given with transitory improvement in BP, amiodarone was started but without the bolus due to soft BP.  Patient was transferred to SDU. Blood pressure continues to remain soft, so IV Levophed was ordered to be started peripherally with goal to give bolus amiodarone with improvement of BP. Cardiology was consulted and we shall await further recommendations.  Critical time: 37 minutes   Critical care personally provided  managing the patient due to high probability of clinically significant and life threatening deterioration. This critical care time included obtaining a history; examining the patient, pulse oximetry; ordering and review of studies; arranging urgent treatment with development of a management plan; evaluation of patient's response of treatment; frequent reassessment; and discussions with other providers.  This critical care time was performed to assess and manage the high probability of imminent and life threatening deterioration that could result in multi-organ failure.  Please refer to admission H&P, progress notes, consult notes for details regarding the care of this patient

## 2024-06-07 NOTE — Telephone Encounter (Signed)
 Pt was admitted to the hospital and her daughter would like a c/b

## 2024-06-07 NOTE — Plan of Care (Signed)
  Problem: Education: °Goal: Knowledge of General Education information will improve °Description: Including pain rating scale, medication(s)/side effects and non-pharmacologic comfort measures °Outcome: Progressing °  °Problem: Clinical Measurements: °Goal: Respiratory complications will improve °Outcome: Progressing °Goal: Cardiovascular complication will be avoided °Outcome: Progressing °  °Problem: Elimination: °Goal: Will not experience complications related to bowel motility °Outcome: Progressing °Goal: Will not experience complications related to urinary retention °Outcome: Progressing °  °

## 2024-06-07 NOTE — Progress Notes (Signed)
 PHARMACY - ANTICOAGULATION CONSULT NOTE  Pharmacy Consult for heparin Indication: atrial fibrillation  No Known Allergies  Patient Measurements: Height: 5' 5 (165.1 cm) Weight: 64 kg (141 lb) IBW/kg (Calculated) : 57 HEPARIN DW (KG): 59  Vital Signs: Temp: 97.7 F (36.5 C) (10/07 0747) Temp Source: Oral (10/07 0747) BP: 87/39 (10/07 1015) Pulse Rate: 116 (10/07 1015)  Labs: Recent Labs    06/06/24 0755 06/06/24 0831 06/07/24 0451  HGB 13.2  --  11.7*  HCT 40.1  --  36.0  PLT 215  --  222  LABPROT  --  18.4*  --   INR  --  1.5*  --   CREATININE 0.81  --  0.63  CKTOTAL  --  36*  --     Estimated Creatinine Clearance: 50.5 mL/min (by C-G formula based on SCr of 0.63 mg/dL).   Medical History: Past Medical History:  Diagnosis Date   CAD (coronary artery disease)    DES to LAD 2017   Endometrial cancer (HCC)    Hypertension    Radiation 06/01/15, 06/12/15, 06/15/15, 06/19/15, 06/21/15   proximal vagina 30 gray    Assessment: 83 year old female with history of afib on apixaban  prior to admit. Patient with possible need for liver biopsy this admit. Discussed with primary team will transition patient to heparin infusion tonight as apixaban  was given this morning. Hemoglobin down slightly overnight from 13.2>11.7. Platelets are normal. No bleeding is noted.   Goal of Therapy:  Heparin level 0.3-0.7 units/ml aPTT 66-102 seconds Monitor platelets by anticoagulation protocol: Yes   Plan:  Start heparin infusion at 750 units/hr Check anti-Xa level and aptt 8 hours after infusion started then daily while on heparin Continue to monitor H&H and platelets Follow up plans for surgery/biopsy  Dempsey Blush PharmD., BCPS Clinical Pharmacist 06/07/2024 1:29 PM

## 2024-06-07 NOTE — Telephone Encounter (Signed)
 Daughter calling because mother is hospitalized in [facility]. Per daughter mom back into Afib. Daughter would like for her mother to be transferred to [facility]. Per Daughter she is not pleased with services and care her mom is receiving. Made daughter aware to talk to Provider at the hospital in [place] and have them call our doctor at [facility] for bed placement if available. Made daughter aware this will be forward to patient provider for FYI. Understanding verbalized.

## 2024-06-07 NOTE — Progress Notes (Signed)
 IV 150 mg amio bolus infusing over 30 mins per Dr. Okey.

## 2024-06-07 NOTE — Telephone Encounter (Signed)
 Called to give pt and daughter Dr. Lavona advice. Per daughter correction made pt is not in [facility]. Per daughter patient is in [place]. Made daughter aware that provider will be made aware and per daughter she would like advice and recommendations on pt being transferred to [facility] in [place]. Made daughter aware once advice and recommendation is received daughter will be made aware.

## 2024-06-07 NOTE — Plan of Care (Signed)
  Problem: Education: Goal: Knowledge of General Education information will improve Description: Including pain rating scale, medication(s)/side effects and non-pharmacologic comfort measures Outcome: Progressing   Problem: Health Behavior/Discharge Planning: Goal: Ability to manage health-related needs will improve Outcome: Progressing   Problem: Clinical Measurements: Goal: Ability to maintain clinical measurements within normal limits will improve Outcome: Progressing Goal: Will remain free from infection Outcome: Progressing Goal: Diagnostic test results will improve Outcome: Progressing Goal: Respiratory complications will improve Outcome: Progressing Goal: Cardiovascular complication will be avoided Outcome: Not Progressing   Problem: Activity: Goal: Risk for activity intolerance will decrease Outcome: Not Progressing   Problem: Nutrition: Goal: Adequate nutrition will be maintained Outcome: Progressing   Problem: Coping: Goal: Level of anxiety will decrease Outcome: Progressing   Problem: Elimination: Goal: Will not experience complications related to bowel motility Outcome: Progressing Goal: Will not experience complications related to urinary retention Outcome: Progressing   Problem: Pain Managment: Goal: General experience of comfort will improve and/or be controlled Outcome: Progressing   Problem: Safety: Goal: Ability to remain free from injury will improve Outcome: Progressing   Problem: Skin Integrity: Goal: Risk for impaired skin integrity will decrease Outcome: Progressing

## 2024-06-07 NOTE — Progress Notes (Signed)
 PROGRESS NOTE    Gina Duncan  FMW:[id number] DOB: [birth year] DOA: 06/06/2024 PCP: [name], MD   Brief Narrative:  This 83 y.o. female with PMH significant for atrial fibrillation on Eliquis , CAD, generalized anxiety disorder, hypertension, heart failure, prior endometrial cancer presented in the ED with numerous complaints.  Patient is accompanied by her husband and daughter.  Daughter reports 4 days ago she got exposed to the excessive wind since then she has been complaining about diffuse mild abdominal pain, has developed cough,  chest congestion and throwing up.  She also reports generalized malaise and fatigue which has been progressive and getting worse. She was tachycardic, tachypneic, hypertensive in the ED. CT abdomen and pelvis ruled out pulmonary embolism but shows large rim-enhancing mass in the left lobe of liver with associated segmental biliary duct obstruction and atrophy of left lobe finding consistent with malignancy. General surgery, GI is consulted.  Patient is admitted for further evaluation.  Assessment & Plan:   Principal Problem:   Sepsis (HCC) Active Problems:   Endometrial cancer (HCC)   Atrial fibrillation with RVR (HCC)   Hyperlipidemia, unspecified   HTN (hypertension)   Gastroesophageal reflux disease   Paroxysmal atrial fibrillation (HCC)   Liver mass  Suspected sepsis , unclear etiology: Patient presented with tachycardia, tachypnea, nausea, vomiting, elevated liver enzymes, met SIRS criteria.   CTA chest shows suspected atelectasis, ruled out PE. Initiated on IV ceftriaxone , azithromycin . Continue IV fluid resuscitation. Follow-up blood cultures.   De-escalate antibiotics when able to.   Elevated liver enzymes, possible liver malignancy; Patient complains of abdominal pain, nausea,  vomiting CTA chest shows hepatic mass with associated lymphadenopathy GI is consulted , Plan is to have liver biopsy that would be done at [facility].  Family is not sure about pursuing liver biopsy.  Monitor LFTs, Follow up tumor markers.   Elevated troponin due to demand ischemia; Patient denies any chest pain, dizziness, palpitations EKG does not show any significant ischemic changes. Likely demand ischemia in the setting of sepsis.   Essential hypertension; Continue losartan , metoprolol .   A-fib with RVR , Hypotension: Heart rate remained elevated likely due the sepsis. Developed A-fib with RVR and hypotension early this a.m. Patient started on Levophed and amiodarone infusion. Cardiology consult appreciated, recommended to continue with amiodarone load.   She does not needs DCCV at this time since she was in sinus rhythm yesterday.   Hyperlipidemia : Continue Crestor .  Goals of care discussion: Went over DNR/DNI.  Explained in detail about overall condition. Family wants to think about it. She is still full code.   DVT prophylaxis: Eliquis  Code Status: Full code Family Communication: Daughter, Husband at bed side. Disposition Plan:    Status is: Inpatient Remains inpatient appropriate because: Severity of illness    Consultants:  Cardiology Gastroenterology Oncology  Procedures: CT  Antimicrobials:  Anti-infectives (From admission, onward)    Start     Dose/Rate Route Frequency Ordered Stop   06/07/24 1000  cefTRIAXone  (ROCEPHIN ) 1 g in sodium chloride  0.9 % 100 mL IVPB        1 g 200 mL/hr over 30 Minutes Intravenous Every 24 hours 06/06/24 1226 06/12/24 0959   06/07/24 1000  azithromycin  (ZITHROMAX ) 500 mg in sodium chloride  0.9 % 250 mL IVPB        500 mg 250 mL/hr over 60 Minutes Intravenous Every 24 hours 06/06/24 1226 06/12/24 0959   06/06/24 1200  cefTRIAXone  (ROCEPHIN ) 2 g in sodium chloride  0.9 % 100 mL  IVPB        2 g 200 mL/hr over 30 Minutes Intravenous Once 06/06/24 1155 06/06/24 1732   06/06/24 1200  azithromycin  (ZITHROMAX ) 500 mg in sodium chloride  0.9 % 250 mL IVPB        500 mg 250  mL/hr over 60 Minutes Intravenous  Once 06/06/24 1155 06/06/24 1732      Subjective: Patient was seen and examined at bedside.  Overnight events noted. Heart rate continued to remain elevated, blood pressure is improved with Levophed support.  Patient remains on amiodarone infusion.  Objective: Vitals:   06/07/24 1300 06/07/24 1315 06/07/24 1330 06/07/24 1348  BP: (!) 106/54 (!) 103/54 (!) 104/59 101/65  Pulse: (!) 111 (!) 111 (!) 128 (!) 126  Resp: (!) 27 (!) 24 (!) 28 (!) 29  Temp:      TempSrc:      SpO2: 98% 98% 97% 97%  Weight:      Height:        Intake/Output Summary (Last 24 hours) at 06/07/2024 1412 Last data filed at 06/07/2024 1307 Gross per 24 hour  Intake 3400.32 ml  Output 1 ml  Net 3399.32 ml   Filed Weights   06/06/24 0738 06/06/24 1333  Weight: 59 kg 64 kg    Examination:  General exam: Appears calm and comfortable, deconditioned, not in any acute distress. Respiratory system: Clear to auscultation. Respiratory effort normal. [id number] Cardiovascular system: S1 & S2 heard, irregular rhythm, no murmur.   Gastrointestinal system: Abdomen is non distended, soft and non tender.  Normal bowel sounds heard. Central nervous system: Alert and oriented X 3. No focal neurological deficits. Extremities: No edema, no cyanosis, no clubbing. Skin: No rashes, lesions or ulcers Psychiatry: Judgement and insight appear normal. Mood & affect appropriate.   Data Reviewed: I have personally reviewed following labs and imaging studies  CBC: Recent Labs  Lab 06/06/24 0755 06/07/24 0451  WBC 6.8 5.7  HGB 13.2 11.7*  HCT 40.1 36.0  MCV 95.5 95.7  PLT 215 222   Basic Metabolic Panel: Recent Labs  Lab 06/06/24 0755 06/07/24 0451  NA 134* 135  K 3.9 3.5  CL 94* 96*  CO2 28 26  GLUCOSE 126* 89  BUN 12 10  CREATININE 0.81 0.63  CALCIUM  9.5 8.6*  MG  --  1.4*  PHOS  --  2.9   GFR: Estimated Creatinine Clearance: 50.5 mL/min (by C-G formula based on SCr of 0.63  mg/dL). Liver Function Tests: Recent Labs  Lab 06/06/24 0755 06/06/24 0834 06/07/24 0451  AST 21 23 18   ALT 8 9 7   ALKPHOS 87 89 76  BILITOT 2.6* 2.6* 1.5*  PROT 7.7 7.8 6.2*  ALBUMIN 4.0 4.0 3.1*   Recent Labs  Lab 06/06/24 0755  LIPASE 16   No results for input(s): AMMONIA in the last 168 hours. Coagulation Profile: Recent Labs  Lab 06/06/24 0831  INR 1.5*   Cardiac Enzymes: Recent Labs  Lab 06/06/24 0831  CKTOTAL 36*   BNP (last 3 results) No results for input(s): PROBNP in the last 8760 hours. HbA1C: No results for input(s): HGBA1C in the last 72 hours. CBG: No results for input(s): GLUCAP in the last 168 hours. Lipid Profile: No results for input(s): CHOL, HDL, LDLCALC, TRIG, CHOLHDL, LDLDIRECT in the last 72 hours. Thyroid Function Tests: No results for input(s): TSH, T4TOTAL, FREET4, T3FREE, THYROIDAB in the last 72 hours. Anemia Panel: No results for input(s): VITAMINB12, FOLATE, FERRITIN, TIBC, IRON, RETICCTPCT in the last  72 hours. Sepsis Labs: Recent Labs  Lab 06/06/24 1123 06/06/24 1231  LATICACIDVEN 1.1 1.0    Recent Results (from the past 240 hours)  Resp panel by RT-PCR (RSV, Flu A&B, Covid) Anterior Nasal Swab     Status: None   Collection Time: 06/06/24  8:58 AM   Specimen: Anterior Nasal Swab  Result Value Ref Range Status   SARS Coronavirus 2 by RT PCR NEGATIVE NEGATIVE Final    Comment: (NOTE) SARS-CoV-2 target nucleic acids are NOT DETECTED.  The SARS-CoV-2 RNA is generally detectable in upper respiratory specimens during the acute phase of infection. The lowest concentration of SARS-CoV-2 viral copies this assay can detect is 138 copies/mL. A negative result does not preclude SARS-Cov-2 infection and should not be used as the sole basis for treatment or other patient management decisions. A negative result may occur with  improper specimen collection/handling, submission of specimen  other than nasopharyngeal swab, presence of viral mutation(s) within the areas targeted by this assay, and inadequate number of viral copies(<138 copies/mL). A negative result must be combined with clinical observations, patient history, and epidemiological information. The expected result is Negative.  Fact Sheet for Patients:  BloggerCourse.com  Fact Sheet for Healthcare Providers:  SeriousBroker.it  This test is no t yet approved or cleared by the [place]  FDA and  has been authorized for detection and/or diagnosis of SARS-CoV-2 by FDA under an Emergency Use Authorization (EUA). This EUA will remain  in effect (meaning this test can be used) for the duration of the COVID-19 declaration under Section 564(b)(1) of the Act, 21 U.S.C.section 360bbb-3(b)(1), unless the authorization is terminated  or revoked sooner.       Influenza A by PCR NEGATIVE NEGATIVE Final   Influenza B by PCR NEGATIVE NEGATIVE Final    Comment: (NOTE) The Xpert Xpress SARS-CoV-2/FLU/RSV plus assay is intended as an aid in the diagnosis of influenza from Nasopharyngeal swab specimens and should not be used as a sole basis for treatment. Nasal washings and aspirates are unacceptable for Xpert Xpress SARS-CoV-2/FLU/RSV testing.  Fact Sheet for Patients: BloggerCourse.com  Fact Sheet for Healthcare Providers: SeriousBroker.it  This test is not yet approved or cleared by the [place]  FDA and has been authorized for detection and/or diagnosis of SARS-CoV-2 by FDA under an Emergency Use Authorization (EUA). This EUA will remain in effect (meaning this test can be used) for the duration of the COVID-19 declaration under Section 564(b)(1) of the Act, 21 U.S.C. section 360bbb-3(b)(1), unless the authorization is terminated or revoked.     Resp Syncytial Virus by PCR NEGATIVE NEGATIVE Final     Comment: (NOTE) Fact Sheet for Patients: BloggerCourse.com  Fact Sheet for Healthcare Providers: SeriousBroker.it  This test is not yet approved or cleared by the [place]  FDA and has been authorized for detection and/or diagnosis of SARS-CoV-2 by FDA under an Emergency Use Authorization (EUA). This EUA will remain in effect (meaning this test can be used) for the duration of the COVID-19 declaration under Section 564(b)(1) of the Act, 21 U.S.C. section 360bbb-3(b)(1), unless the authorization is terminated or revoked.  Performed at [facility], [address], [place], [place] [address]   Blood culture (routine x 2)     Status: None (Preliminary result)   Collection Time: 06/06/24 12:12 PM   Specimen: BLOOD  Result Value Ref Range Status   Specimen Description BLOOD BLOOD LEFT ARM  Final   Special Requests   Final    BOTTLES DRAWN AEROBIC AND  ANAEROBIC Blood Culture adequate volume   Culture   Final    NO GROWTH < 24 HOURS Performed at [facility], [address] [place], [place] [address]    Report Status PENDING  Incomplete  Blood culture (routine x 2)     Status: None (Preliminary result)   Collection Time: 06/06/24 12:40 PM   Specimen: BLOOD  Result Value Ref Range Status   Specimen Description BLOOD BLOOD RIGHT HAND AEROBIC BOTTLE ONLY  Final   Special Requests   Final    Blood Culture results may not be optimal due to an inadequate volume of blood received in culture bottles BOTTLES DRAWN AEROBIC ONLY   Culture   Final    NO GROWTH < 24 HOURS Performed at [facility], [address], [place], [place] [address]    Report Status PENDING  Incomplete         Radiology Studies: MR ABDOMEN WITH MRCP W CONTRAST Result Date: 06/06/2024 EXAM: MRCP WITH IV CONTRAST 06/06/2024 06:52:40 PM TECHNIQUE: Multisequence, multiplanar magnetic resonance images of the abdomen with intravenous contrast. MRCP sequences were  performed. 7 mL (gadobutrol (GADAVIST) 1 MMOL/ML injection 7 mL GADOBUTROL 1 MMOL/ML IV SOLN). COMPARISON: CT of earlier today. CLINICAL HISTORY: [id number] Liver mass 203554. Table formatting from the original note was not included.; Liver mass Liver mass. FINDINGS: LIMITATIONS/ARTIFACTS: Mild motion degradation throughout. LIVER: No cirrhosis. Segment 4a 3.8 x 5.1 cm ill-defined mass with delayed hyper-enhancement including on image 39 / 27. GALLBLADDER AND BILIARY SYSTEM: Cholecystectomy. Marked intrahepatic biliary duct dilatation including throughout the majority of the left hepatic lobe and minimally into segment 8, secondary to the segment 4a mass. Just upstream from the obstruction, an intraductal stone of 9 mm is identified included on image 19 / 4 and 9 / 3. No common duct dilatation. SPLEEN: Unremarkable. PANCREAS/PANCREATIC DUCT: Visualized pancreas is unremarkable. No pancreatic ductal dilatation. ADRENAL GLANDS: Unremarkable. KIDNEYS: Bilateral nonenhancing renal lesions of up to renal 2.8 cm are likely cysts and do not warrant imaging follow up. Mild left renal cortical thinning. LYMPH NODES: Right Cardiophrenic Angle adenopathy including at 1.0 cm on image 33 / 27. No enlarged abdominal lymph nodes. VASCULATURE: Advanced aortic atherosclerosis. Patent portal and splenic veins. PERITONEUM: Small volume perihepatic ascites. ABDOMINAL WALL: No hernia. No mass. BONES: Moderate convex right lumbar spine curvature. No acute abnormality or worrisome osseous lesion. SOFT TISSUES: Moderate right hemidiaphragm elevation. Small bilateral pleural effusions. MISCELLANEOUS: Unremarkable. IMPRESSION: 1. 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 Metastatic disease from colon cancer could have a similar appearance but is felt less likely. 2.  intraductal 9 mm stone just upstream from  the obstructive mass 3. tiny bilateral pleural effusions and small volume perihepatic ascites. 4.  right Cardiophrenic Angle adenopathy, again suspicious for nodal metastasis. 5.  mild motion degradation throughout. Electronically signed by: Rockey Kilts MD 06/06/2024 07:22 PM EDT RP Workstation: [id number]   MR 3D Recon At Scanner Result Date: 06/06/2024 EXAM: MRCP WITH IV CONTRAST 06/06/2024 06:52:40 PM TECHNIQUE: Multisequence, multiplanar magnetic resonance images of the abdomen with intravenous contrast. MRCP sequences were performed. 7 mL (gadobutrol (GADAVIST) 1 MMOL/ML injection 7 mL GADOBUTROL 1 MMOL/ML IV SOLN). COMPARISON: CT of earlier today. CLINICAL HISTORY: [id number] Liver mass [id number]. Table formatting from the original note was not included.; Liver mass Liver mass. FINDINGS: LIMITATIONS/ARTIFACTS: Mild motion degradation throughout. LIVER: No cirrhosis. Segment 4a 3.8 x 5.1 cm ill-defined  mass with delayed hyper-enhancement including on image 39 / 27. GALLBLADDER AND BILIARY SYSTEM: Cholecystectomy. Marked intrahepatic biliary duct dilatation including throughout the majority of the left hepatic lobe and minimally into segment 8, secondary to the segment 4a mass. Just upstream from the obstruction, an intraductal stone of 9 mm is identified included on image 19 / 4 and 9 / 3. No common duct dilatation. SPLEEN: Unremarkable. PANCREAS/PANCREATIC DUCT: Visualized pancreas is unremarkable. No pancreatic ductal dilatation. ADRENAL GLANDS: Unremarkable. KIDNEYS: Bilateral nonenhancing renal lesions of up to renal 2.8 cm are likely cysts and do not warrant imaging follow up. Mild left renal cortical thinning. LYMPH NODES: Right Cardiophrenic Angle adenopathy including at 1.0 cm on image 33 / 27. No enlarged abdominal lymph nodes. VASCULATURE: Advanced aortic atherosclerosis. Patent portal and splenic veins. PERITONEUM: Small volume perihepatic ascites. ABDOMINAL WALL: No hernia. No mass. BONES: Moderate  convex right lumbar spine curvature. No acute abnormality or worrisome osseous lesion. SOFT TISSUES: Moderate right hemidiaphragm elevation. Small bilateral pleural effusions. MISCELLANEOUS: Unremarkable. IMPRESSION: 1. 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 Metastatic disease from colon cancer could have a similar appearance but is felt less likely. 2.  intraductal 9 mm stone just upstream from the obstructive mass 3. tiny bilateral pleural effusions and small volume perihepatic ascites. 4.  right Cardiophrenic Angle adenopathy, again suspicious for nodal metastasis. 5.  mild motion degradation throughout. Electronically signed by: Rockey Kilts MD 06/06/2024 07:22 PM EDT RP Workstation: [id number]   CT ABDOMEN PELVIS W CONTRAST Result Date: 06/06/2024 CLINICAL DATA:  PE suspected, cough, vomiting * Tracking Code: BO * EXAM: CT ANGIOGRAPHY CHEST CT ABDOMEN AND PELVIS WITH CONTRAST TECHNIQUE: Multidetector CT imaging of the chest was performed using the standard protocol during bolus administration of intravenous contrast. Multiplanar CT image reconstructions and MIPs were obtained to evaluate the vascular anatomy. Multidetector CT imaging of the abdomen and pelvis was performed using the standard protocol during bolus administration of intravenous contrast. RADIATION DOSE REDUCTION: This exam was performed according to the departmental dose-optimization program which includes automated exposure control, adjustment of the mA and/or kV according to patient size and/or use of iterative reconstruction technique. CONTRAST:  75mL OMNIPAQUE IOHEXOL 350 MG/ML SOLN COMPARISON:  None Available. FINDINGS: CT CHEST ANGIOGRAM FINDINGS Cardiovascular: Satisfactory opacification of the pulmonary arteries to the segmental level. No evidence of pulmonary embolism. Normal heart size.  Three-vessel coronary artery calcifications. Enlargement of the main pulmonary artery measuring up to 3.6 cm in caliber. No pericardial effusion. Severe aortic atherosclerosis. Mediastinum/Nodes: Enlarged right cardiophrenic angle lymph nodes measuring up to 1.4 x 1.1 cm (series 2, image 16). No other enlarged mediastinal, hilar, or axillary lymph nodes. Thyroid gland, trachea, and esophagus demonstrate no significant findings. Lungs/Pleura: Diffuse bilateral bronchial wall thickening. Extensive clustered nodular airspace opacity throughout the lungs, particularly in the posterior upper lobes (series 6, image 45) although with several more discrete nodular appearing opacities throughout the lungs, for example in the peripheral anterior right lower lobe measuring 1.1 x 0.9 cm (series 6, image 98). No pleural effusion or pneumothorax. Musculoskeletal: No chest wall abnormality. No acute osseous findings. Review of the MIP images confirms the above findings. CT ABDOMEN PELVIS FINDINGS Hepatobiliary: Large, rim enhancing mass in the left lobe of the liver, centered in hepatic segment IV, measuring 5.7 x 4.8 cm with associated segmental biliary ductal obstruction and atrophy of the left lobe (series 2, image 21). No  gallstones, gallbladder wall thickening, or extrahepatic biliary dilatation. Pancreas: Unremarkable. No pancreatic ductal dilatation or surrounding inflammatory changes. Spleen: Normal in size without significant abnormality. Adrenals/Urinary Tract: Adrenal glands are unremarkable. Simple, benign bilateral renal cortical cysts. Kidneys are otherwise normal, without renal calculi, solid lesion, or hydronephrosis. Bladder is unremarkable. Stomach/Bowel: Stomach is within normal limits. Appendix appears normal. No evidence of bowel wall thickening, distention, or inflammatory changes. Descending and sigmoid diverticulosis. Vascular/Lymphatic: Severe aortic atherosclerosis. No enlarged abdominal or pelvic lymph  nodes. Reproductive: No mass or other significant abnormality. Other: No abdominal wall hernia or abnormality. Small volume perihepatic ascites. Musculoskeletal: No acute or significant osseous findings. IMPRESSION: 1. Negative examination for pulmonary embolism. 2. Large, rim enhancing mass in the left lobe of the liver, centered in hepatic segment IV, measuring 5.7 x 4.8 cm with associated segmental biliary ductal obstruction and atrophy of the left lobe. 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 3. Enlarged right cardiophrenic angle lymph nodes, highly suspicious for nodal metastatic disease. 4. Small volume perihepatic ascites. 5. Extensive clustered nodular airspace opacity throughout the lungs, particularly in the posterior upper lobes, with several more discrete nodular appearing opacities throughout the lungs. There is some component of atypical infection, however small pulmonary metastases amongst these airspace opacities very difficult to confidently exclude. 6. Diffuse bilateral bronchial wall thickening, consistent with nonspecific infectious or inflammatory bronchitis. 7. Enlargement of the main pulmonary artery, as can be seen in pulmonary hypertension. 8. Coronary artery disease. Aortic Atherosclerosis (ICD10-I70.0). Electronically Signed   By: Marolyn JONETTA Jaksch M.D.   On: 06/06/2024 10:17   CT Angio Chest PE W and/or Wo Contrast Result Date: 06/06/2024 CLINICAL DATA:  PE suspected, cough, vomiting * Tracking Code: BO * EXAM: CT ANGIOGRAPHY CHEST CT ABDOMEN AND PELVIS WITH CONTRAST TECHNIQUE: Multidetector CT imaging of the chest was performed using the standard protocol during bolus administration of intravenous contrast. Multiplanar CT image reconstructions and MIPs were obtained to evaluate the vascular anatomy. Multidetector CT imaging of the abdomen and pelvis  was performed using the standard protocol during bolus administration of intravenous contrast. RADIATION DOSE REDUCTION: This exam was performed according to the departmental dose-optimization program which includes automated exposure control, adjustment of the mA and/or kV according to patient size and/or use of iterative reconstruction technique. CONTRAST:  75mL OMNIPAQUE IOHEXOL 350 MG/ML SOLN COMPARISON:  None Available. FINDINGS: CT CHEST ANGIOGRAM FINDINGS Cardiovascular: Satisfactory opacification of the pulmonary arteries to the segmental level. No evidence of pulmonary embolism. Normal heart size. Three-vessel coronary artery calcifications. Enlargement of the main pulmonary artery measuring up to 3.6 cm in caliber. No pericardial effusion. Severe aortic atherosclerosis. Mediastinum/Nodes: Enlarged right cardiophrenic angle lymph nodes measuring up to 1.4 x 1.1 cm (series 2, image 16). No other enlarged mediastinal, hilar, or axillary lymph nodes. Thyroid gland, trachea, and esophagus demonstrate no significant findings. Lungs/Pleura: Diffuse bilateral bronchial wall thickening. Extensive clustered nodular airspace opacity throughout the lungs, particularly in the posterior upper lobes (series 6, image 45) although with several more discrete nodular appearing opacities throughout the lungs, for example in the peripheral anterior right lower lobe measuring 1.1 x 0.9 cm (series 6, image 98). No pleural effusion or pneumothorax. Musculoskeletal: No chest wall abnormality. No acute osseous findings. Review of the MIP images confirms the above findings. CT ABDOMEN PELVIS FINDINGS Hepatobiliary: Large, rim enhancing mass in the left lobe of the liver, centered in hepatic segment IV, measuring 5.7  x 4.8 cm with associated segmental biliary ductal obstruction and atrophy of the left lobe (series 2, image 21). No gallstones, gallbladder wall thickening, or extrahepatic biliary dilatation. Pancreas: Unremarkable. No  pancreatic ductal dilatation or surrounding inflammatory changes. Spleen: Normal in size without significant abnormality. Adrenals/Urinary Tract: Adrenal glands are unremarkable. Simple, benign bilateral renal cortical cysts. Kidneys are otherwise normal, without renal calculi, solid lesion, or hydronephrosis. Bladder is unremarkable. Stomach/Bowel: Stomach is within normal limits. Appendix appears normal. No evidence of bowel wall thickening, distention, or inflammatory changes. Descending and sigmoid diverticulosis. Vascular/Lymphatic: Severe aortic atherosclerosis. No enlarged abdominal or pelvic lymph nodes. Reproductive: No mass or other significant abnormality. Other: No abdominal wall hernia or abnormality. Small volume perihepatic ascites. Musculoskeletal: No acute or significant osseous findings. IMPRESSION: 1. Negative examination for pulmonary embolism. 2. Large, rim enhancing mass in the left lobe of the liver, centered in hepatic segment IV, measuring 5.7 x 4.8 cm with associated segmental biliary ductal obstruction and atrophy of the left lobe. 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 3. Enlarged right cardiophrenic angle lymph nodes, highly suspicious for nodal metastatic disease. 4. Small volume perihepatic ascites. 5. Extensive clustered nodular airspace opacity throughout the lungs, particularly in the posterior upper lobes, with several more discrete nodular appearing opacities throughout the lungs. There is some component of atypical infection, however small pulmonary metastases amongst these airspace opacities very difficult to confidently exclude. 6. Diffuse bilateral bronchial wall thickening, consistent with nonspecific infectious or inflammatory bronchitis. 7. Enlargement of the main pulmonary artery, as can be seen in pulmonary hypertension. 8. Coronary  artery disease. Aortic Atherosclerosis (ICD10-I70.0). Electronically Signed   By: Marolyn JONETTA Jaksch M.D.   On: 06/06/2024 10:17   Scheduled Meds:  docusate sodium  100 mg Oral BID   rosuvastatin   5 mg Oral Daily   Continuous Infusions:  sodium chloride  Stopped (06/07/24 0840)   sodium chloride  Stopped (06/07/24 0840)   amiodarone 60 mg/hr (06/07/24 1332)   azithromycin  Stopped (06/07/24 1100)   cefTRIAXone  (ROCEPHIN )  IV Stopped (06/07/24 0947)   heparin     norepinephrine (LEVOPHED) Adult infusion 3 mcg/min (06/07/24 1255)     LOS: 1 day   Time spent: 50 mins  Darcel Dawley, MD [facility]   If 7PM-7AM, please contact night-coverage

## 2024-06-07 NOTE — Assessment & Plan Note (Addendum)
 Patient presented to the ED with abdominal pain and found to have  large, rim enhancing mass in the left lobe of the liver, centered in hepatic segment IV, measuring 5.7 x 4.8 cm with associated segmental biliary ductal obstruction and atrophy of the left lobe, No gallstones, gallbladder wall thickening, or extrahepatic biliary dilatation. Recommended MRCP to further characterize lesion. 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 Metastatic disease from colon cancer could have a similar appearance but is felt less likely. Tumor markers are still pending. We have discussed case with GI who is on board with a liver biopsy.  Unfortunately, she is on a blood thinner Eliquis  for atrial fibrillation. Last dose was this morning.  We we will have to wait 48 hours from last dose prior to liver biopsy to bridge.  At this point, liver biopsy will be scheduled on Thursday or as an outpatient if she is discharged prior. Will need biopsy prior to visit at the cancer center.  Anticipate her being seen within 1 to 2 weeks after liver biopsy.

## 2024-06-07 NOTE — Consult Note (Signed)
 Cardiology Consultation   Patient ID: Gina T. Murdoch MRN: [id number]; DOB: [birth year]  Admit date: 06/06/2024 Date of Consult: 06/07/2024  PCP:  [name], MD   [facility] Providers Cardiologist:  Hochrein   Patient Profile: Gina Duncan. Jaskolski is a 83 y.o. female with a hx of  who is being seen 06/07/2024 for the evaluation of afib with RVR  at the request of .  History of Present Illness: Ms. Oconnor is  an 83 yo with hx of CAD, PAF, HTN, HL, endometrial CA. 2017  DES to mid LAD  Aug 2023  Admitted for atrial fibrillation   Underwent TEE/DCCV on 04/21/22    Echo showed severe LAE, Normal LVEF   Pt last seen in cardiology clinic in Jan 2025  Doing OK at the time    Patient admitted yesterday with SOB, poor PO intake  Pt developed cough and mild abdominal pain 4 days PTA    In ER was in sinus tachycardia   SBP 194/89  Sat 92%     D Dimer 9.25   CTA showed no PE but large rim enhancing mass in L lobe of liver  Concern for malignancy    Labs drawn.   GI has seen pt.  Plan MRI/MRCP   At 6 AM developed afib wit hRVR HR 180s     Pt became hypotensive  Complained of chest pressure Pt given IV fluids and amiodarone (bolus then gtt)   Levophed started  BP is now better   100s/   HR 130s (afib )  PT says pressure is improved    Past Medical History:  Diagnosis Date   CAD (coronary artery disease)    DES to LAD 2017   Endometrial cancer (HCC)    Hypertension    Radiation 06/01/15, 06/12/15, 06/15/15, 06/19/15, 06/21/15   proximal vagina 30 gray    Past Surgical History:  Procedure Laterality Date   cardiac stents  2017   CARDIOVERSION N/A 04/21/2022   Procedure: CARDIOVERSION;  Surgeon: Shlomo Wilbert SAUNDERS, MD;  Location: [facility] ENDOSCOPY;  Service: Cardiovascular;  Laterality: N/A;   CHOLECYSTECTOMY     ROBOTIC ASSISTED TOTAL HYSTERECTOMY WITH BILATERAL SALPINGO OOPHERECTOMY  02/13/2015   by Dr. Arlee   TEE WITHOUT CARDIOVERSION N/A 04/21/2022   Procedure:  TRANSESOPHAGEAL ECHOCARDIOGRAM (TEE);  Surgeon: Shlomo Wilbert SAUNDERS, MD;  Location: [facility] ENDOSCOPY;  Service: Cardiovascular;  Laterality: N/A;     Home Medications:  Prior to Admission medications   Medication Sig Start Date End Date Taking? Authorizing Provider  acetaminophen  (TYLENOL ) 500 MG tablet Take 500 mg by mouth every 6 (six) hours as needed for moderate pain (pain score 4-6).   Yes [provider]  apixaban  (ELIQUIS ) 5 MG TABS tablet Take 1 tablet (5 mg total) by mouth 2 (two) times daily. 02/24/24  Yes Lavona Agent, MD  losartan  (COZAAR ) 50 MG tablet Take 50 mg by mouth daily.   Yes [provider]  magnesium  oxide (MAG-OX) 400 MG tablet Take 1 tablet (400 mg total) by mouth daily. 06/11/22  Yes Lavona Agent, MD  metoprolol  tartrate (LOPRESSOR ) 25 MG tablet Take 1 tablet (25 mg total) by mouth 2 (two) times daily. 10/29/23  Yes Lavona Agent, MD  omeprazole (PRILOSEC OTC) 20 MG tablet Take 20 mg by mouth daily as needed (for heartburn).   Yes [provider]  rosuvastatin  (CRESTOR ) 5 MG tablet Take 1 tablet by mouth once daily 04/20/24  Yes Lavona Agent, MD    Scheduled  Meds:  amiodarone  150 mg Intravenous Once   apixaban   5 mg Oral BID   docusate sodium  100 mg Oral BID   losartan   25 mg Oral Daily   metoprolol  tartrate  25 mg Oral BID   rosuvastatin   5 mg Oral Daily   Continuous Infusions:  sodium chloride  40 mL/hr at 06/07/24 0355   sodium chloride  Stopped (06/07/24 0840)   sodium chloride  Stopped (06/07/24 0840)   amiodarone 60 mg/hr (06/07/24 0649)   amiodarone     azithromycin      cefTRIAXone  (ROCEPHIN )  IV     magnesium  sulfate bolus IVPB 4 g (06/07/24 0747)   norepinephrine (LEVOPHED) Adult infusion 3 mcg/min (06/07/24 0839)   PRN Meds: acetaminophen  **OR** acetaminophen , ondansetron **OR** ondansetron (ZOFRAN) IV  Allergies:   No Known Allergies  Social History:   Social History   Socioeconomic History   Marital status:  Married    Spouse name: Not on file   Number of children: 2   Years of education: Not on file   Highest education level: Not on file  Occupational History   Not on file  Tobacco Use   Smoking status: Former    Types: Cigarettes   Smokeless tobacco: Former  Substance and Sexual Activity   Alcohol use: No    Alcohol/week: 0.0 standard drinks of alcohol   Drug use: No   Sexual activity: Not on file  Other Topics Concern   Not on file  Social History Narrative   Married, 2 children and 8 grand and 5 great   Social Drivers of Corporate investment banker Strain: Not on file  Food Insecurity: No Food Insecurity (06/06/2024)   Hunger Vital Sign    Worried About Running Out of Food in the Last Year: Never true    Ran Out of Food in the Last Year: Never true  Transportation Needs: No Transportation Needs (06/06/2024)   PRAPARE - Administrator, Civil Service (Medical): No    Lack of Transportation (Non-Medical): No  Physical Activity: Not on file  Stress: Not on file  Social Connections: Socially Integrated (06/06/2024)   Social Connection and Isolation Panel    Frequency of Communication with Friends and Family: More than three times a week    Frequency of Social Gatherings with Friends and Family: More than three times a week    Attends Religious Services: More than 4 times per year    Active Member of [employer] or Organizations: Yes    Attends Engineer, structural: More than 4 times per year    Marital Status: Married  Catering manager Violence: Not At Risk (06/06/2024)   Humiliation, Afraid, Rape, and Kick questionnaire    Fear of Current or Ex-Partner: No    Emotionally Abused: No    Physically Abused: No    Sexually Abused: No    Family History:    Family History  Problem Relation Age of Onset   Diabetes Mother    Peripheral vascular disease Mother    Stroke Father    Breast cancer Daughter      ROS:  Please see the history of present illness.    All other ROS reviewed and negative.     Physical Exam/Data: Vitals:   06/07/24 0747 06/07/24 0750 06/07/24 0800 06/07/24 0810  BP: (!) 75/44 (!) 105/38 (!) 87/29 (!) 79/43  Pulse: (!) 157 (!) 36 70 (!) 48  Resp: (!) 26 (!) 31 (!) 25 (!) 30  Temp: 97.7 F (36.5 C)     TempSrc: Oral     SpO2: 100% 100% 97% 100%  Weight:      Height:        Intake/Output Summary (Last 24 hours) at 06/07/2024 0840 Last data filed at 06/07/2024 0355 Gross per 24 hour  Intake 2079.93 ml  Output 1 ml  Net 2078.93 ml      06/06/2024    1:33 PM 06/06/2024    7:38 AM 09/09/2023   12:02 PM  Last 3 Weights  Weight (lbs) 141 lb 130 lb 133 lb  Weight (kg) 63.957 kg 58.968 kg 60.328 kg     Body mass index is 23.46 kg/m.  General:  Thin 83 yo  in no acute distress\ HEENT: normal Neck: no JVD  No bruits  Cardiac: Irreg irreg  No S3   No murmurs  Lungs:  clear to auscultation bilaterallly Abd: soft, MIld diffuse tenderness Ext: no edema 2+ pulses  Skin: warm and dry  Sl icteric   Neuro:  CNs 2-12 intact, no focal abnormalities noted Psych:  Normal affect   EKG:  The EKG was personally reviewed and demonstrates:  06/06/24:  Sinus tachycardia   103 bpm   LVH with repolarization abnormality   rSR' V1 06/08/25  Afib with RVR    175 bpm   ST depression inferior and laterally    Telemetry:  Telemetry was personally reviewed and demonstrates:  Afib with RVR   Rates improving  130s   Relevant CV Studies:  Echo Aug 2024  1. Left ventricular ejection fraction, by estimation, is 60 to 65%. The  left ventricle has normal function. The left ventricle has no regional  wall motion abnormalities. Left ventricular diastolic parameters are  consistent with Grade II diastolic  dysfunction (pseudonormalization).   2. Right ventricular systolic function is normal. The right ventricular  size is normal. There is normal pulmonary artery systolic pressure.   3. The mitral valve is abnormal. No evidence of mitral  valve  regurgitation. Mild mitral stenosis. Severe mitral annular calcification.   4. The aortic valve has an indeterminant number of cusps. There is  moderate calcification of the aortic valve. There is moderate thickening  of the aortic valve. Aortic valve regurgitation is not visualized. No  aortic stenosis is present.   5. Aortic dilatation noted. There is borderline dilatation of the  ascending aorta, measuring 37 mm.   6. The inferior vena cava is normal in size with greater than 50%  respiratory variability, suggesting right atrial pressure of 3 mmHg.   Comparison(s): Changes from prior study are noted. LVEF improved from  40-45% to normal now.    Laboratory Data: High Sensitivity Troponin:  No results for input(s): TROPONINIHS in the last 720 hours.   Chemistry Recent Labs  Lab 06/06/24 0755 06/07/24 0451  NA 134* 135  K 3.9 3.5  CL 94* 96*  CO2 28 26  GLUCOSE 126* 89  BUN 12 10  CREATININE 0.81 0.63  CALCIUM  9.5 8.6*  MG  --  1.4*  GFRNONAA >60 >60  ANIONGAP 13 12    Recent Labs  Lab 06/06/24 0755 06/06/24 0834 06/07/24 0451  PROT 7.7 7.8 6.2*  ALBUMIN 4.0 4.0 3.1*  AST 21 23 18   ALT 8 9 7   ALKPHOS 87 89 76  BILITOT 2.6* 2.6* 1.5*   Lipids No results for input(s): CHOL, TRIG, HDL, LABVLDL, LDLCALC, CHOLHDL in the last 168 hours.  Hematology Recent Labs  Lab  06/06/24 0755 06/07/24 0451  WBC 6.8 5.7  RBC 4.20 3.76*  HGB 13.2 11.7*  HCT 40.1 36.0  MCV 95.5 95.7  MCH 31.4 31.1  MCHC 32.9 32.5  RDW 12.2 12.2  PLT 215 222   Thyroid No results for input(s): TSH, FREET4 in the last 168 hours.  BNPNo results for input(s): BNP, PROBNP in the last 168 hours.  DDimer  Recent Labs  Lab 06/06/24 0831  DDIMER 9.25*    Radiology/Studies:  MR ABDOMEN WITH MRCP W CONTRAST Result Date: 06/06/2024 EXAM: MRCP WITH IV CONTRAST 06/06/2024 06:52:40 PM TECHNIQUE: Multisequence, multiplanar magnetic resonance images of the abdomen with  intravenous contrast. MRCP sequences were performed. 7 mL (gadobutrol (GADAVIST) 1 MMOL/ML injection 7 mL GADOBUTROL 1 MMOL/ML IV SOLN). COMPARISON: CT of earlier today. CLINICAL HISTORY: [id number] Liver mass [id number]. Table formatting from the original note was not included.; Liver mass Liver mass. FINDINGS: LIMITATIONS/ARTIFACTS: Mild motion degradation throughout. LIVER: No cirrhosis. Segment 4a 3.8 x 5.1 cm ill-defined mass with delayed hyper-enhancement including on image 39 / 27. GALLBLADDER AND BILIARY SYSTEM: Cholecystectomy. Marked intrahepatic biliary duct dilatation including throughout the majority of the left hepatic lobe and minimally into segment 8, secondary to the segment 4a mass. Just upstream from the obstruction, an intraductal stone of 9 mm is identified included on image 19 / 4 and 9 / 3. No common duct dilatation. SPLEEN: Unremarkable. PANCREAS/PANCREATIC DUCT: Visualized pancreas is unremarkable. No pancreatic ductal dilatation. ADRENAL GLANDS: Unremarkable. KIDNEYS: Bilateral nonenhancing renal lesions of up to renal 2.8 cm are likely cysts and do not warrant imaging follow up. Mild left renal cortical thinning. LYMPH NODES: Right Cardiophrenic Angle adenopathy including at 1.0 cm on image 33 / 27. No enlarged abdominal lymph nodes. VASCULATURE: Advanced aortic atherosclerosis. Patent portal and splenic veins. PERITONEUM: Small volume perihepatic ascites. ABDOMINAL WALL: No hernia. No mass. BONES: Moderate convex right lumbar spine curvature. No acute abnormality or worrisome osseous lesion. SOFT TISSUES: Moderate right hemidiaphragm elevation. Small bilateral pleural effusions. MISCELLANEOUS: Unremarkable. IMPRESSION: 1. 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 Metastatic disease from colon cancer could have a similar appearance but is felt less likely. 2.   intraductal 9 mm stone just upstream from the obstructive mass 3. tiny bilateral pleural effusions and small volume perihepatic ascites. 4.  right Cardiophrenic Angle adenopathy, again suspicious for nodal metastasis. 5.  mild motion degradation throughout. Electronically signed by: Rockey Kilts MD 06/06/2024 07:22 PM EDT RP Workstation: [id number]   MR 3D Recon At Scanner Result Date: 06/06/2024 EXAM: MRCP WITH IV CONTRAST 06/06/2024 06:52:40 PM TECHNIQUE: Multisequence, multiplanar magnetic resonance images of the abdomen with intravenous contrast. MRCP sequences were performed. 7 mL (gadobutrol (GADAVIST) 1 MMOL/ML injection 7 mL GADOBUTROL 1 MMOL/ML IV SOLN). COMPARISON: CT of earlier today. CLINICAL HISTORY: [id number] Liver mass [id number]. Table formatting from the original note was not included.; Liver mass Liver mass. FINDINGS: LIMITATIONS/ARTIFACTS: Mild motion degradation throughout. LIVER: No cirrhosis. Segment 4a 3.8 x 5.1 cm ill-defined mass with delayed hyper-enhancement including on image 39 / 27. GALLBLADDER AND BILIARY SYSTEM: Cholecystectomy. Marked intrahepatic biliary duct dilatation including throughout the majority of the left hepatic lobe and minimally into segment 8, secondary to the segment 4a mass. Just upstream from the obstruction, an intraductal stone of 9 mm is identified included on image 19 / 4 and 9 / 3. No common duct dilatation. SPLEEN: Unremarkable. PANCREAS/PANCREATIC DUCT: Visualized pancreas is  unremarkable. No pancreatic ductal dilatation. ADRENAL GLANDS: Unremarkable. KIDNEYS: Bilateral nonenhancing renal lesions of up to renal 2.8 cm are likely cysts and do not warrant imaging follow up. Mild left renal cortical thinning. LYMPH NODES: Right Cardiophrenic Angle adenopathy including at 1.0 cm on image 33 / 27. No enlarged abdominal lymph nodes. VASCULATURE: Advanced aortic atherosclerosis. Patent portal and splenic veins. PERITONEUM: Small volume perihepatic ascites. ABDOMINAL  WALL: No hernia. No mass. BONES: Moderate convex right lumbar spine curvature. No acute abnormality or worrisome osseous lesion. SOFT TISSUES: Moderate right hemidiaphragm elevation. Small bilateral pleural effusions. MISCELLANEOUS: Unremarkable. IMPRESSION: 1. 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 Metastatic disease from colon cancer could have a similar appearance but is felt less likely. 2.  intraductal 9 mm stone just upstream from the obstructive mass 3. tiny bilateral pleural effusions and small volume perihepatic ascites. 4.  right Cardiophrenic Angle adenopathy, again suspicious for nodal metastasis. 5.  mild motion degradation throughout. Electronically signed by: Rockey Kilts MD 06/06/2024 07:22 PM EDT RP Workstation: [id number]   CT ABDOMEN PELVIS W CONTRAST Result Date: 06/06/2024 CLINICAL DATA:  PE suspected, cough, vomiting * Tracking Code: BO * EXAM: CT ANGIOGRAPHY CHEST CT ABDOMEN AND PELVIS WITH CONTRAST TECHNIQUE: Multidetector CT imaging of the chest was performed using the standard protocol during bolus administration of intravenous contrast. Multiplanar CT image reconstructions and MIPs were obtained to evaluate the vascular anatomy. Multidetector CT imaging of the abdomen and pelvis was performed using the standard protocol during bolus administration of intravenous contrast. RADIATION DOSE REDUCTION: This exam was performed according to the departmental dose-optimization program which includes automated exposure control, adjustment of the mA and/or kV according to patient size and/or use of iterative reconstruction technique. CONTRAST:  75mL OMNIPAQUE IOHEXOL 350 MG/ML SOLN COMPARISON:  None Available. FINDINGS: CT CHEST ANGIOGRAM FINDINGS Cardiovascular: Satisfactory opacification of the pulmonary arteries to the segmental level. No evidence of  pulmonary embolism. Normal heart size. Three-vessel coronary artery calcifications. Enlargement of the main pulmonary artery measuring up to 3.6 cm in caliber. No pericardial effusion. Severe aortic atherosclerosis. Mediastinum/Nodes: Enlarged right cardiophrenic angle lymph nodes measuring up to 1.4 x 1.1 cm (series 2, image 16). No other enlarged mediastinal, hilar, or axillary lymph nodes. Thyroid gland, trachea, and esophagus demonstrate no significant findings. Lungs/Pleura: Diffuse bilateral bronchial wall thickening. Extensive clustered nodular airspace opacity throughout the lungs, particularly in the posterior upper lobes (series 6, image 45) although with several more discrete nodular appearing opacities throughout the lungs, for example in the peripheral anterior right lower lobe measuring 1.1 x 0.9 cm (series 6, image 98). No pleural effusion or pneumothorax. Musculoskeletal: No chest wall abnormality. No acute osseous findings. Review of the MIP images confirms the above findings. CT ABDOMEN PELVIS FINDINGS Hepatobiliary: Large, rim enhancing mass in the left lobe of the liver, centered in hepatic segment IV, measuring 5.7 x 4.8 cm with associated segmental biliary ductal obstruction and atrophy of the left lobe (series 2, image 21). No gallstones, gallbladder wall thickening, or extrahepatic biliary dilatation. Pancreas: Unremarkable. No pancreatic ductal dilatation or surrounding inflammatory changes. Spleen: Normal in size without significant abnormality. Adrenals/Urinary Tract: Adrenal glands are unremarkable. Simple, benign bilateral renal cortical cysts. Kidneys are otherwise normal, without renal calculi, solid lesion, or hydronephrosis. Bladder is unremarkable. Stomach/Bowel: Stomach is within normal limits. Appendix appears normal. No evidence of bowel wall thickening, distention, or inflammatory changes. Descending and sigmoid diverticulosis.  Vascular/Lymphatic: Severe aortic atherosclerosis.  No enlarged abdominal or pelvic lymph nodes. Reproductive: No mass or other significant abnormality. Other: No abdominal wall hernia or abnormality. Small volume perihepatic ascites. Musculoskeletal: No acute or significant osseous findings. IMPRESSION: 1. Negative examination for pulmonary embolism. 2. Large, rim enhancing mass in the left lobe of the liver, centered in hepatic segment IV, measuring 5.7 x 4.8 cm with associated segmental biliary ductal obstruction and atrophy of the left lobe. 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 3. Enlarged right cardiophrenic angle lymph nodes, highly suspicious for nodal metastatic disease. 4. Small volume perihepatic ascites. 5. Extensive clustered nodular airspace opacity throughout the lungs, particularly in the posterior upper lobes, with several more discrete nodular appearing opacities throughout the lungs. There is some component of atypical infection, however small pulmonary metastases amongst these airspace opacities very difficult to confidently exclude. 6. Diffuse bilateral bronchial wall thickening, consistent with nonspecific infectious or inflammatory bronchitis. 7. Enlargement of the main pulmonary artery, as can be seen in pulmonary hypertension. 8. Coronary artery disease. Aortic Atherosclerosis (ICD10-I70.0). Electronically Signed   By: Marolyn JONETTA Jaksch M.D.   On: 06/06/2024 10:17   CT Angio Chest PE W and/or Wo Contrast Result Date: 06/06/2024 CLINICAL DATA:  PE suspected, cough, vomiting * Tracking Code: BO * EXAM: CT ANGIOGRAPHY CHEST CT ABDOMEN AND PELVIS WITH CONTRAST TECHNIQUE: Multidetector CT imaging of the chest was performed using the standard protocol during bolus administration of intravenous contrast. Multiplanar CT image reconstructions and MIPs were obtained to evaluate the vascular anatomy. Multidetector  CT imaging of the abdomen and pelvis was performed using the standard protocol during bolus administration of intravenous contrast. RADIATION DOSE REDUCTION: This exam was performed according to the departmental dose-optimization program which includes automated exposure control, adjustment of the mA and/or kV according to patient size and/or use of iterative reconstruction technique. CONTRAST:  75mL OMNIPAQUE IOHEXOL 350 MG/ML SOLN COMPARISON:  None Available. FINDINGS: CT CHEST ANGIOGRAM FINDINGS Cardiovascular: Satisfactory opacification of the pulmonary arteries to the segmental level. No evidence of pulmonary embolism. Normal heart size. Three-vessel coronary artery calcifications. Enlargement of the main pulmonary artery measuring up to 3.6 cm in caliber. No pericardial effusion. Severe aortic atherosclerosis. Mediastinum/Nodes: Enlarged right cardiophrenic angle lymph nodes measuring up to 1.4 x 1.1 cm (series 2, image 16). No other enlarged mediastinal, hilar, or axillary lymph nodes. Thyroid gland, trachea, and esophagus demonstrate no significant findings. Lungs/Pleura: Diffuse bilateral bronchial wall thickening. Extensive clustered nodular airspace opacity throughout the lungs, particularly in the posterior upper lobes (series 6, image 45) although with several more discrete nodular appearing opacities throughout the lungs, for example in the peripheral anterior right lower lobe measuring 1.1 x 0.9 cm (series 6, image 98). No pleural effusion or pneumothorax. Musculoskeletal: No chest wall abnormality. No acute osseous findings. Review of the MIP images confirms the above findings. CT ABDOMEN PELVIS FINDINGS Hepatobiliary: Large, rim enhancing mass in the left lobe of the liver, centered in hepatic segment IV, measuring 5.7 x 4.8 cm with associated segmental biliary ductal obstruction and atrophy of the left lobe (series 2, image 21). No gallstones, gallbladder wall thickening, or extrahepatic biliary  dilatation. Pancreas: Unremarkable. No pancreatic ductal dilatation or surrounding inflammatory changes. Spleen: Normal in size without significant abnormality. Adrenals/Urinary Tract: Adrenal glands are unremarkable. Simple, benign bilateral renal cortical cysts. Kidneys are otherwise normal, without renal calculi, solid lesion, or hydronephrosis. Bladder is unremarkable. Stomach/Bowel: Stomach  is within normal limits. Appendix appears normal. No evidence of bowel wall thickening, distention, or inflammatory changes. Descending and sigmoid diverticulosis. Vascular/Lymphatic: Severe aortic atherosclerosis. No enlarged abdominal or pelvic lymph nodes. Reproductive: No mass or other significant abnormality. Other: No abdominal wall hernia or abnormality. Small volume perihepatic ascites. Musculoskeletal: No acute or significant osseous findings. IMPRESSION: 1. Negative examination for pulmonary embolism. 2. Large, rim enhancing mass in the left lobe of the liver, centered in hepatic segment IV, measuring 5.7 x 4.8 cm with associated segmental biliary ductal obstruction and atrophy of the left lobe. 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 3. Enlarged right cardiophrenic angle lymph nodes, highly suspicious for nodal metastatic disease. 4. Small volume perihepatic ascites. 5. Extensive clustered nodular airspace opacity throughout the lungs, particularly in the posterior upper lobes, with several more discrete nodular appearing opacities throughout the lungs. There is some component of atypical infection, however small pulmonary metastases amongst these airspace opacities very difficult to confidently exclude. 6. Diffuse bilateral bronchial wall thickening, consistent with nonspecific infectious or inflammatory bronchitis. 7. Enlargement of the main pulmonary artery, as can be seen  in pulmonary hypertension. 8. Coronary artery disease. Aortic Atherosclerosis (ICD10-I70.0). Electronically Signed   By: Marolyn JONETTA Jaksch M.D.   On: 06/06/2024 10:17     Assessment and Plan: Afib with RVR  Pt with hx of PAF   Last documented spell 2023   Developed afib wit hRVR and hypotension early this am   Given fluids and started on levophed   Amiodarone started  (bolus and IV)     Heart rates have improved   BP is better than earliier this am  Recomm: I would continue with amiodarone load.  GIve additional 150 over 30 min then continue at 60    I do not think at this pt she needs DCCV   Since she was in SR yesterday I am optimistic that she can return  2   CAD  Pt with intervention (Rotablator and DES ) to LAD in 2017   Pt notes only had chest pressure when very tachycardic      She has very mild elevation in troponin (66, 69) that is  flat     May reflect demand in setting of other medical problems   3  HTN  PT currently on pressors for hypotension   Follow   ? SIRS   Lactate normal    ABx started   4  HL   Has been on Crestor    5  GI  New Dx of liver mass.  GI is following    Signed, Vina Gull, MD  06/07/2024 8:40 AM

## 2024-06-08 ENCOUNTER — Other Ambulatory Visit (HOSPITAL_COMMUNITY): Payer: Self-pay | Admitting: *Deleted

## 2024-06-08 ENCOUNTER — Inpatient Hospital Stay (HOSPITAL_COMMUNITY)

## 2024-06-08 DIAGNOSIS — R160 Hepatomegaly, not elsewhere classified: Secondary | ICD-10-CM | POA: Diagnosis not present

## 2024-06-08 DIAGNOSIS — R571 Hypovolemic shock: Secondary | ICD-10-CM

## 2024-06-08 DIAGNOSIS — Z515 Encounter for palliative care: Secondary | ICD-10-CM

## 2024-06-08 DIAGNOSIS — Z7189 Other specified counseling: Secondary | ICD-10-CM | POA: Diagnosis not present

## 2024-06-08 DIAGNOSIS — I2510 Atherosclerotic heart disease of native coronary artery without angina pectoris: Secondary | ICD-10-CM

## 2024-06-08 DIAGNOSIS — R011 Cardiac murmur, unspecified: Secondary | ICD-10-CM | POA: Diagnosis not present

## 2024-06-08 DIAGNOSIS — K6389 Other specified diseases of intestine: Secondary | ICD-10-CM | POA: Diagnosis not present

## 2024-06-08 DIAGNOSIS — I4891 Unspecified atrial fibrillation: Secondary | ICD-10-CM

## 2024-06-08 DIAGNOSIS — A419 Sepsis, unspecified organism: Secondary | ICD-10-CM | POA: Diagnosis not present

## 2024-06-08 LAB — CBC
HCT: 37.7 % (ref 36.0–46.0)
Hemoglobin: 12.1 g/dL (ref 12.0–15.0)
MCH: 30.9 pg (ref 26.0–34.0)
MCHC: 32.1 g/dL (ref 30.0–36.0)
MCV: 96.4 fL (ref 80.0–100.0)
Platelets: 308 K/uL (ref 150–400)
RBC: 3.91 MIL/uL (ref 3.87–5.11)
RDW: 12.6 % (ref 11.5–15.5)
WBC: 8.4 K/uL (ref 4.0–10.5)
nRBC: 0 % (ref 0.0–0.2)

## 2024-06-08 LAB — CANCER ANTIGEN 19-9: CA 19-9: 35 U/mL (ref 0–35)

## 2024-06-08 LAB — BASIC METABOLIC PANEL WITH GFR
Anion gap: 12 (ref 5–15)
BUN: 15 mg/dL (ref 8–23)
CO2: 25 mmol/L (ref 22–32)
Calcium: 8.6 mg/dL — ABNORMAL LOW (ref 8.9–10.3)
Chloride: 95 mmol/L — ABNORMAL LOW (ref 98–111)
Creatinine, Ser: 0.84 mg/dL (ref 0.44–1.00)
GFR, Estimated: >60 mL/min (ref >60)
Glucose, Bld: 146 mg/dL — ABNORMAL HIGH (ref 70–99)
Potassium: 3.6 mmol/L (ref 3.5–5.1)
Sodium: 133 mmol/L — ABNORMAL LOW (ref 135–145)

## 2024-06-08 LAB — ECHOCARDIOGRAM COMPLETE
Area-P 1/2: 3.03 cm2
Est EF: >75
Height: 65 in
MV VTI: 1.2 cm2
S' Lateral: 1.9 cm
Weight: 2256 [oz_av]

## 2024-06-08 LAB — MAGNESIUM: Magnesium: 2.1 mg/dL (ref 1.7–2.4)

## 2024-06-08 LAB — APTT
aPTT: 50 s — ABNORMAL HIGH (ref 24–36)
aPTT: 49 s — ABNORMAL HIGH (ref 24–36)

## 2024-06-08 LAB — HEPARIN LEVEL (UNFRACTIONATED): Heparin Unfractionated: >1.1 [IU]/mL — ABNORMAL HIGH (ref 0.30–0.70)

## 2024-06-08 LAB — AFP TUMOR MARKER: AFP, Serum, Tumor Marker: <1.8 ng/mL (ref 0.0–9.2)

## 2024-06-08 LAB — CEA: CEA: 0.9 ng/mL (ref 0.0–4.7)

## 2024-06-08 LAB — PHOSPHORUS: Phosphorus: 3 mg/dL (ref 2.5–4.6)

## 2024-06-08 MED ORDER — AMIODARONE HCL 200 MG PO TABS
200.0000 mg | ORAL_TABLET | Freq: Two times a day (BID) | ORAL | Status: DC
  Administered 2024-06-08 – 2024-06-13 (×12): 200 mg via ORAL
  Filled 2024-06-08 (×12): qty 1

## 2024-06-08 NOTE — Progress Notes (Signed)
 [facility] PROGRESS NOTE  Gina Duncan (DOB: September 16, 1940) FMW:[id number] PCP: [name], MD  Brief Narrative: Gina Duncan is an 83 y.o. female with a history of PAF on eliquis , CAD, GAD, HTN, HFrEF, endometrial CA who presented to the ED on 06/06/2024 with numerous complaints, including diffuse mild abdominal pain, has developed cough,  chest congestion and throwing up.  She also reports generalized malaise and fatigue which has been progressive and getting worse. She was tachycardic, tachypneic, hypertensive in the ED. CT abdomen and pelvis ruled out pulmonary embolism but shows large rim-enhancing mass in the left lobe of liver with associated segmental biliary duct obstruction and atrophy of left lobe finding consistent with malignancy.   She was admitted with GI and oncology consultations. Hospitalization complicated by AFib with RVR for which amiodarone was initiated. Hypotension required brief levophed infusion. Cardiology has been following and palliative care has also been consulted.   GI has recommended IR biopsy. IR has reviewed the case and images, recommends continuing heparin for anticoagulation and stopping 4 hours prior to procedure, will plan U/S guided biopsy, possibly with paracentesis as well.   Subjective: Chest pressure subsided, converted to NSR this AM.   Objective: BP (!) 149/48   Pulse 84   Temp 98.1 F (36.7 C) (Oral)   Resp (!) 35   Ht 5' 5 (1.651 m)   Wt 64 kg   SpO2 92%   BMI 23.46 kg/m   Gen: Older female in no acute distress Pulm: Clear, nonlabored  CV: RRR, NSR on monitor.  GI: Soft, protuberant without focal tenderness, rebound or guarding. +BS.  Neuro: Alert and oriented. No new focal deficits. Ext: Warm, no deformities Skin: No rashes, lesions or ulcers on visualized skin   Assessment & Plan: Suspected sepsis due to pneumonia with mixed shock state: Hypotension related to AFib w/RVR, sepsis, hypovolemia, now resolved.   Patient presented with tachycardia, tachypnea, nausea, vomiting, elevated liver enzymes, met SIRS criteria.   CTA chest shows suspected atelectasis, ruled out PE. - Continue ceftriaxone  and azithromycin  - Follow up blood cultures - Weaning from pressor, improved after return to NSR.    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  - Needs tissue diagnosis. D/w Dr. Vanice who believes this is amenable to U/S-guided biopsy, will make NPO p MN.  - Monitor LFTs, Follow up tumor markers. - GI following.    CAD with demand ischemia; - Hx LAD PCI 2017, had some chest pain with rapid rates that subsided, mildly elevated Tn and flat. No further intervention planned. Continue statin, anticoagulation.    Essential hypertension; - hold home antihypertensives while weaning levophed, restart as able.    PAF, A-fib with RVR, Hypotension: - Converted to NSR 10/8 AM, convert amiodarone gtt to po per cardiology. 200mg  po BID x3 weeks, then 200mg  once daily (watch LFTs)  - Continue heparin gtt in place of PTA eliquis , though can switch back to DOAC after biopsy.  - Echocardiogram pending.  - Pt of Dr. Denver. Note patient/family would prefer to be hospitalized at [facility].    Hyperlipidemia : - Continue rosuvastatin .   Goals of care discussion: - Extensive discussions with multiple disciplines and family members. Pt opts for full code, would like to pursue treatments depending on the outcome of the biopsy. Palliative has also engaged with patient/family.   Bernardino KATHEE Come, MD [facility] [email or web address] 06/08/2024, 3:01 PM

## 2024-06-08 NOTE — Progress Notes (Signed)
 Pt converted to NSR this morning. HR in the 60s. EKG done.

## 2024-06-08 NOTE — Consult Note (Signed)
 Consultation Note Date: 06/08/2024   Patient Name: Gina Duncan. Braver  DOB: Oct 09, 1940  MRN: [id number]  Age / Sex: 83 y.o., female  PCP: [name], MD Referring Physician: Bryn Bernardino NOVAK, MD  Reason for Consultation: Establishing goals of care  HPI/Patient Profile: 83 y.o. female  with past medical history of coronary artery disease, atrial fibrillation, hypertension, and history of endometrial cancer admitted on 06/06/2024 with abdominal pain, cough, chest congestion, and nausea/vomiting.   She was noted to be hypertensive, tachypneic, and tachycardic. Workup revealed findings concerning for possible cholangiocarcinoma with large rim-enhancing mass in the left lobe of liver measuring 7.5 X4.8 with associated segmental biliary duct obstruction and atrophy of left lobe findings consistent with malignancy. Enlarged right cardiophrenic angle lymph nodes highly suspicious for nodal metastatic disease.  Hospital course has been complicated by atrial fibrillation with RVR and hypotension. Required vasopressor support and amiodarone drip. Admitted to ICU.  She is being followed by cardiology, gastroenterology, and oncology. Notes reviewed.   Prior records reviewed. I see that she has had limited acute care setting encounters (last admission over 2 years ago - 04/18/2022). She is followed by cardiology for her a fib. Last seen Jan 2025 and cleared for one year recall.   PMT has been consulted to assist with goals of care conversation.  Today, labs independently reviewed.  Mild hyponatremia noted compared to previous.  Sodium level 133.  Would recommend continuing to trend.  Glucose 146.  Questionable significance given uncertain if it was fasting.  Renal function stable.  Hypocalcemia noted.  This is likely in the setting of hypoalbuminemia noted earlier during this admission. Low albumin levels are associated with greater disease burden and poorer long-term prognosis.   CBC reviewed is unremarkable.  Tumor malignancy markers negative.  Most recent EKG obtained 06/08/2024 independently reviewed.  EKG appears to be normal sinus rhythm.  QTc normal.  Compared EKG from 06/08/2024 to EKG obtained 06/07/2024 which revealed atrial fibrillation with rapid ventricular response.  MRI of the abdomen independently reviewed.  Mass visible within the liver.  Also noted to have some aortic atherosclerosis.  Per impression, there is also some lymphadenopathy noted concerning for lymph node metastases.  CT of the abdomen and pelvis with contrast and CT angio impression reviewed.  Similar findings to MRI.  Large enhancing mass and lymphadenopathy noted as well as some incidental findings of aortic atherosclerosis and coronary artery disease.  No evidence of PE.  Vital signs reviewed.  Blood pressures have been variable with some intermittent hypotension.  Blood pressure will typically rebound shortly after.  Some mild tachypnea noted.  Heart rate appears stable currently.  Patient appears to have converted back to normal sinus rhythm on monitor and per EKG obtained today.  Medication administration record reviewed.  Nursing have discontinued amiodarone drip and are weaning patient off of Levophed.  No as needed symptom meds given on 24-hour look back.  Patient states she is doing much better today.  She denies any pain currently.  She states she has some shortness of breath on admission but this has resolved.  She reports last bowel movement was about 2 days ago.  Clinical Assessment and Goals of Care:  I have reviewed medical records including EPIC notes, labs and imaging (independently reviewed), medication administration record, vital signs, assessed the patient and then met with patient and family to discuss diagnosis prognosis, GOC, EOL wishes, disposition and options. Collaborated directly with attending physician, bedside nursing staff, TOC.  Also present for  the conversation was patient's  daughter, husband, and granddaughter.  I introduced Palliative Medicine as specialized medical care for people living with serious illness. It focuses on providing relief from the symptoms and stress of a serious illness. The goal is to improve quality of life for both the patient and the family.  We discussed a brief life review of the patient and then focused on their current illness.   I attempted to elicit values and goals of care important to the patient.    Medical History Review and Family/Patient Understanding:   Patient understands and abnormality was found on previous CT scan and MRI that could possibly be cancer. Engaged in a detailed conversation with the patient about the seriousness of her current illness, in light of her age and complex medical history, and explained the potential implications this may have for her recovery and long-term well-being.  Social History:  Patient lives at home with her husband.  She has 2 children and 16 grandchildren and great-grandchildren.  Her 2 children live very close by and are supportive.  She has a very strong faith.  She loves spending time outside, gardening, and taking care of her chickens.  She also loves baking and cooking.  She is known for her coconut cake.  Functional and Nutritional State:  Prior to this hospitalization, patient was completely independent and ambulatory.  She was caring for herself, doing all the cooking and cleaning for home as well as working in the garden and caring for her animals.  She started feeling poorly on Sunday and this prompted her to come to the hospital earlier this week. Poor appetite x 1 week otherwise stable.  Palliative Symptoms:  None currently  Advance Directives/Goals of Care/Anticipatory care planning Discussion:  The patient and family consented to a voluntary Advance Care Planning Conversation in person. Individuals present for the conversation: This nurse practitioner, patient,  granddaughter, husband  A detailed discussion regarding GOC, advanced directives, and anticipatory care planning was had.  Discussed what quality of life means for patient.  She states that for her quality of life means being able to be functional, care for herself, and spend time doing the things she likes.  A life where she would be completely bedbound or dependent on others for care would not be an acceptable quality of life for her.  She would also not desire life where she would be dependent on machines.  We discussed our concerns given her age, comorbidities, and potential new findings that is possible that her health could get worse and could get worse quickly.  If unable to speak for herself, she would want her husband to be her health surrogate. He does not have healthcare power of attorney or formal paperwork but shares that he knows and loves her and she states he knows what she would want.  Given the importance of maintaining good quality of life and functional status, engaged in discussion of advance directives including the limitations and potential burdens of CPR and intubation, particularly in the context of her age and serious underlying health conditions.  Patient states that there was already a doctor in her room talking about that and they scared and upset her.  She states she wants to live but would not want to live on machines.  She states that she wants to talk to her family before making any adjustments to her CODE STATUS.  She wishes to remain full code/full scope until she has more time to consider this and  talk with her family.  We also discussed recent findings on CT and MRI and that we are worried about a malignancy.  Discussed that we would never want to do a test unless it was going to help us  better care for patient.  We discussed that if she would not consider chemo, radiation, or surgery then there would be little utility in performing a biopsy.  Patient states that she would at  least consider oral chemo and would want to talk to her doctor to hear recommendations and prognosis before making any other decisions regarding further treatments.  She wishes to continue to pursue biopsy to determine next treatment steps.  Therefore, patient to remain full code/full scope.  Proceed with plan to transfer to [facility] for liver biopsy and further workup.  Discussed the importance of continued conversation with family and the medical providers regarding overall plan of care and treatment options, ensuring decisions are within the context of the patient's values and GOCs.   Questions and concerns were addressed.  Hard Choices booklet offered but patient declined. The family was encouraged to call with questions or concerns.  PMT will continue to support holistically.  I spent 30 minutes providing separately identifiable ACP services with the patient and/or surrogate decision maker in a voluntary, in-person conversation discussing the patient's wishes and goals as detailed in the above note.  Primary Decision maker and health care surrogate:  PATIENT, husband if unable to speak for herself  Code Status:  Full code/full scope    SUMMARY OF RECOMMENDATIONS    Continue full code/full scope Would consider cancer directed therapies depending on prognosis etc. Therefore appropriate for further workup Transfer to [facility] for further workup including liver biopsy  Continue current symptom regimen as appears effective at present Palliative medicine team will continue to follow for ongoing goals of care discussion, symptom management, and coordination of care.  Code Status/Advance Care Planning: Full code   Symptom Management:  Symptoms stable at present, therefore continue symptom regimen per admitting team with PMT available as needed for support   Psycho-social/Spiritual:  Desire for further Chaplaincy support:no, her church family has been by to support her if she feels  her spiritual needs are met at the present  Prognosis:  Unable to determine  Discharge Planning: To Be Determined      Primary Diagnoses: Present on Admission:  Sepsis (HCC)  Endometrial cancer (HCC)  Atrial fibrillation with RVR (HCC)  Hyperlipidemia, unspecified  HTN (hypertension)  Gastroesophageal reflux disease  Liver mass, left lobe  Paroxysmal atrial fibrillation (HCC)    Physical Exam Constitutional:      General: She is not in acute distress.    Appearance: She is not toxic-appearing.  Pulmonary:     Effort: Pulmonary effort is normal. No respiratory distress.  Skin:    General: Skin is warm and dry.  Neurological:     Mental Status: She is alert.     Comments: Pleasant and cooperative  Psychiatric:        Mood and Affect: Mood normal.        Behavior: Behavior normal.     Vital Signs: BP (!) 142/52   Pulse 68   Temp 98 F (36.7 C) (Oral)   Resp (!) 24   Ht 5' 5 (1.651 m)   Wt 64 kg   SpO2 95%   BMI 23.46 kg/m  Pain Scale: 0-10   Pain Score: 0-No pain   SpO2: SpO2: 95 % O2 Device:SpO2: 95 %  O2 Flow Rate: .O2 Flow Rate (L/min): 2 L/min   Palliative Assessment/Data: 80%     Billing based on MDM: High  Problems Addressed: One acute or chronic illness or injury that poses a threat to life or bodily function  Amount and/or Complexity of Data: Category 1:Review of prior external note(s) from each unique source, Category 2:Independent interpretation of a test performed by another physician/other qualified health care professional (not separately reported), and Category 3:Discussion of management or test interpretation with external physician/other qualified health care professional/appropriate source (not separately reported)  Risks: n/a   [name], NP  Palliative Medicine Team Team phone # [phone]  Thank you for allowing the Palliative Medicine Team to assist in the care of this patient. Please utilize secure chat with  additional questions, if there is no response within 30 minutes please call the above phone number.  Palliative Medicine Team providers are available by phone from 7am to 7pm daily and can be reached through the team cell phone.  Should this patient require assistance outside of these hours, please call the patient's attending physician.

## 2024-06-08 NOTE — Progress Notes (Signed)
*  PRELIMINARY RESULTS* Echocardiogram 2D Echocardiogram has been performed.  Gina Duncan 06/08/2024, 1:50 PM

## 2024-06-08 NOTE — Progress Notes (Signed)
 PHARMACY - ANTICOAGULATION CONSULT NOTE  Pharmacy Consult for heparin Indication: atrial fibrillation  No Known Allergies  Patient Measurements: Height: 5' 5 (165.1 cm) Weight: 64 kg (141 lb) IBW/kg (Calculated) : 57 HEPARIN DW (KG): 59  Vital Signs: Temp: 98.6 F (37 C) (10/08 1630) Temp Source: Oral (10/08 1630) BP: 143/57 (10/08 1500) Pulse Rate: 77 (10/08 1500)  Labs: Recent Labs    06/06/24 0755 06/06/24 0831 06/07/24 0451 06/08/24 0635 06/08/24 1555  HGB 13.2  --  11.7* 12.1  --   HCT 40.1  --  36.0 37.7  --   PLT 215  --  222 308  --   APTT  --   --   --  50* 49*  LABPROT  --  18.4*  --   --   --   INR  --  1.5*  --   --   --   HEPARINUNFRC  --   --   --  >1.10*  --   CREATININE 0.81  --  0.63 0.84  --   CKTOTAL  --  36*  --   --   --     Estimated Creatinine Clearance: 48.1 mL/min (by C-G formula based on SCr of 0.84 mg/dL).   Medical History: Past Medical History:  Diagnosis Date   CAD (coronary artery disease)    DES to LAD 2017   Endometrial cancer (HCC)    Hypertension    Radiation 06/01/15, 06/12/15, 06/15/15, 06/19/15, 06/21/15   proximal vagina 30 gray    Assessment: 83 year old female with history of afib on apixaban  prior to admit. Patient with possible need for liver biopsy this admit. Discussed with primary team will transition patient to heparin infusion tonight as apixaban  was given this morning( 10/7). No bleeding is noted.   Hgb 13.2> 11.7> 12.1 APTT = 50> 49 , subtherapeutic and below goal, no issues with infusion per RN HL>1.1, not correlating  Goal of Therapy:  Heparin level 0.3-0.7 units/ml aPTT 66-102 seconds Monitor platelets by anticoagulation protocol: Yes   Plan:  Increase heparin infusion to 1100 units/hr Check aPTT level in 8 hours and then aPTT and HL daily while on heparin Continue to monitor H&H and platelets Follow up plans for surgery/biopsy  Cherlyn Boers, BS Pharm D, BCPS Clinical Pharmacist 06/08/2024 4:54  PM

## 2024-06-08 NOTE — Progress Notes (Signed)
 PHARMACY - ANTICOAGULATION CONSULT NOTE  Pharmacy Consult for heparin Indication: atrial fibrillation  No Known Allergies  Patient Measurements: Height: 5' 5 (165.1 cm) Weight: 64 kg (141 lb) IBW/kg (Calculated) : 57 HEPARIN DW (KG): 59  Vital Signs: Temp: 98 F (36.7 C) (10/08 0738) Temp Source: Oral (10/08 0738) BP: 114/24 (10/08 0747) Pulse Rate: 66 (10/08 0747)  Labs: Recent Labs    06/06/24 0755 06/06/24 0831 06/07/24 0451 06/08/24 0635  HGB 13.2  --  11.7* 12.1  HCT 40.1  --  36.0 37.7  PLT 215  --  222 308  APTT  --   --   --  50*  LABPROT  --  18.4*  --   --   INR  --  1.5*  --   --   HEPARINUNFRC  --   --   --  >1.10*  CREATININE 0.81  --  0.63 0.84  CKTOTAL  --  36*  --   --     Estimated Creatinine Clearance: 48.1 mL/min (by C-G formula based on SCr of 0.84 mg/dL).   Medical History: Past Medical History:  Diagnosis Date   CAD (coronary artery disease)    DES to LAD 2017   Endometrial cancer (HCC)    Hypertension    Radiation 06/01/15, 06/12/15, 06/15/15, 06/19/15, 06/21/15   proximal vagina 30 gray    Assessment: 83 year old female with history of afib on apixaban  prior to admit. Patient with possible need for liver biopsy this admit. Discussed with primary team will transition patient to heparin infusion tonight as apixaban  was given this morning( 10/7). No bleeding is noted.   Hgb 13.2> 11.7> 12.1 APTT = 50, below goal HL>1.1, not correlating  Goal of Therapy:  Heparin level 0.3-0.7 units/ml aPTT 66-102 seconds Monitor platelets by anticoagulation protocol: Yes   Plan:  Increase heparin infusion to 900 units/hr Check aPTT level in 8 hours and then aPTT and HL daily while on heparin Continue to monitor H&H and platelets Follow up plans for surgery/biopsy  Cherlyn Boers, BS Pharm D, BCPS Clinical Pharmacist 06/08/2024 8:06 AM

## 2024-06-08 NOTE — Plan of Care (Signed)

## 2024-06-08 NOTE — Progress Notes (Addendum)
 Progress Note  Patient Name: Gina Duncan Date of Encounter: 06/08/2024  Primary Cardiologist: None  Subjective   Converted to NSR this a.m.  Inpatient Medications    Scheduled Meds:  Chlorhexidine Gluconate Cloth  6 each Topical Daily   docusate sodium  100 mg Oral BID   rosuvastatin   5 mg Oral Daily   Continuous Infusions:  amiodarone 60 mg/hr (06/08/24 0713)   azithromycin  Stopped (06/07/24 1100)   cefTRIAXone  (ROCEPHIN )  IV 1 g (06/08/24 0815)   heparin 900 Units/hr (06/08/24 0815)   norepinephrine (LEVOPHED) Adult infusion 4 mcg/min (06/08/24 0546)   PRN Meds: acetaminophen  **OR** acetaminophen , ondansetron **OR** ondansetron (ZOFRAN) IV   Vital Signs    Vitals:   06/08/24 0845 06/08/24 0900 06/08/24 0915 06/08/24 0930  BP: (!) 117/23 130/84 (!) 118/100 (!) 132/111  Pulse: 68 74 81 71  Resp: (!) 30 (!) 28 (!) 21 (!) 32  Temp:      TempSrc:      SpO2: (!) 88% 94% 94% 95%  Weight:      Height:        Intake/Output Summary (Last 24 hours) at 06/08/2024 0944 Last data filed at 06/08/2024 0546 Gross per 24 hour  Intake 1749.81 ml  Output 200 ml  Net 1549.81 ml   Filed Weights   06/06/24 0738 06/06/24 1333  Weight: 59 kg 64 kg    Telemetry     Personally reviewed.  NSR.  ECG    NSR.  Physical Exam   GEN: No acute distress.   Neck: No JVD. Cardiac: Murmur present, questionable diastolic. Respiratory: Nonlabored.  Rales in the left lungs. GI: Soft, nontender, bowel sounds present. MS: No edema; No deformity. Neuro:  Nonfocal. Psych: Alert and oriented x 3. Normal affect.  Labs    Chemistry Recent Labs  Lab 06/06/24 0755 06/06/24 0834 06/07/24 0451 06/08/24 0635  NA 134*  --  135 133*  K 3.9  --  3.5 3.6  CL 94*  --  96* 95*  CO2 28  --  26 25  GLUCOSE 126*  --  89 146*  BUN 12  --  10 15  CREATININE 0.81  --  0.63 0.84  CALCIUM  9.5  --  8.6* 8.6*  PROT 7.7 7.8 6.2*  --   ALBUMIN 4.0 4.0 3.1*  --   AST 21 23 18   --   ALT  8 9 7   --   ALKPHOS 87 89 76  --   BILITOT 2.6* 2.6* 1.5*  --   GFRNONAA >60  --  >60 >60  ANIONGAP 13  --  12 12     Hematology Recent Labs  Lab 06/06/24 0755 06/07/24 0451 06/08/24 0635  WBC 6.8 5.7 8.4  RBC 4.20 3.76* 3.91  HGB 13.2 11.7* 12.1  HCT 40.1 36.0 37.7  MCV 95.5 95.7 96.4  MCH 31.4 31.1 30.9  MCHC 32.9 32.5 32.1  RDW 12.2 12.2 12.6  PLT 215 222 308    Cardiac EnzymesNo results for input(s): TROPONINIHS in the last 720 hours.  BNPNo results for input(s): BNP, PROBNP in the last 168 hours.   DDimer Recent Labs  Lab 06/06/24 0831  DDIMER 9.25*     Radiology      Cardiac Studies     Assessment & Plan   Atrial fibrillation with RVR - Converted back to NSR this a.m. on amiodarone drip.  Last episode of A-fib was in 2023. - Switch amiodarone drip to p.o. amiodarone  200 mg twice daily for 3 weeks followed by p.o. amiodarone 200 mg once daily.  Total duration of amiodarone will need to be short-term, 3 months.  Need to monitor LFTs (as she has imaging evidence of hepatic mass).  If there is any evidence of liver toxicity in the next few days, will need to stop amiodarone and start metoprolol  provided she is not on pressors anymore. - Continue heparin drip. - Switch to DOAC upon discharge. - Obtain echocardiogram.  Prior echo from 2024 was unremarkable.  Hypovolemic shock likely secondary to sepsis - On norepinephrine, low-dose.  Try to wean off.  BP today is holding, 130 mmHg SBP.  CAD  - LAD PCI 2017-chest pain only during tachycardia. Hs troponins mildly elevated, flat.  No ischemia.  Continue rosuvastatin  5 mg nightly.  Hepatic mass - GI, oncology and palliative care on board.  Full code.  GI recommended IR guided liver mass biopsy.   CRITICAL CARE Performed by: Diannah SQUIBB Lanah Steines   Total critical care time: 38 minutes  Critical care time was exclusive of separately billable procedures and treating other patients.  Critical care was  necessary to treat or prevent imminent or life-threatening deterioration.  Critical care was time spent personally by me on the following activities: development of treatment plan with patient and/or surrogate as well as nursing, discussions with consultants, evaluation of patient's response to treatment, examination of patient, obtaining history from patient or surrogate, ordering and performing treatments and interventions, ordering and review of laboratory studies, ordering and review of radiographic studies, pulse oximetry and re-evaluation of patient's condition.   [facility] will sign off.   Medication Recommendations: Please see above Other recommendations (labs, testing, etc): None Follow up as an outpatient: Follow-up in 6 weeks in cardiology clinic   Signed, Diannah SQUIBB [place], MD  06/08/2024, 9:44 AM

## 2024-06-08 NOTE — Progress Notes (Signed)
 IR Procedure Request - Liver Lesion Biopsy   83 y.o. female inpatient. (AP) History of CAD, a fib, HTN endometrial cancer. admitted on 10.6. with abdominal pain. nausea and vomiting. MR abd from 10.6 shows new liver lesions.  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 Metastatic disease from colon cancer could have a similar appearance but is felt less likely. Per Palliative note dated 58.8.25 Patient Would consider cancer directed therapies depending on prognosis etc. Therefore appropriate for further workup. Transfer to [facility] for liver biopsy. Team is requesting liver lesion biopsy for further evaluation.  IR consulted for possible liver lesion biopsy . Case has been reviewed and procedure approved by Dr. Frederic Specking.  Patient tentatively scheduled for 10.9.25.  Team instructed to: - Keep Patient to be NPO after midnight - Hold heparin gtt to be held 4 hours prior to procedure. ( Start time to be  determined) - Patient to be seen and consented for procedure upon transfer to [facility]  IR will call patient when ready.

## 2024-06-08 NOTE — Progress Notes (Signed)
 Patient's granddaughter wanted someone to be aware that patient's tooth on the upper left side is hanging on by a thread. Dentist stated they did not want to do anything with it considering she is on blood thinners. Dr. Bryn made aware.

## 2024-06-08 NOTE — Plan of Care (Signed)
?  Problem: Clinical Measurements: ?Goal: Respiratory complications will improve ?Outcome: Progressing ?  ?Problem: Elimination: ?Goal: Will not experience complications related to urinary retention ?Outcome: Progressing ?  ?

## 2024-06-09 DIAGNOSIS — Z515 Encounter for palliative care: Secondary | ICD-10-CM | POA: Diagnosis not present

## 2024-06-09 DIAGNOSIS — A419 Sepsis, unspecified organism: Secondary | ICD-10-CM | POA: Diagnosis not present

## 2024-06-09 DIAGNOSIS — Z7189 Other specified counseling: Secondary | ICD-10-CM | POA: Diagnosis not present

## 2024-06-09 DIAGNOSIS — R160 Hepatomegaly, not elsewhere classified: Secondary | ICD-10-CM | POA: Diagnosis not present

## 2024-06-09 LAB — APTT
aPTT: 89 s — ABNORMAL HIGH (ref 24–36)
aPTT: 61 s — ABNORMAL HIGH (ref 24–36)
aPTT: 63 s — ABNORMAL HIGH (ref 24–36)

## 2024-06-09 LAB — COMPREHENSIVE METABOLIC PANEL WITH GFR
ALT: 6 U/L (ref 0–44)
AST: 22 U/L (ref 15–41)
Albumin: 2.9 g/dL — ABNORMAL LOW (ref 3.5–5.0)
Alkaline Phosphatase: 79 U/L (ref 38–126)
Anion gap: 7 (ref 5–15)
BUN: 12 mg/dL (ref 8–23)
CO2: 29 mmol/L (ref 22–32)
Calcium: 8.6 mg/dL — ABNORMAL LOW (ref 8.9–10.3)
Chloride: 98 mmol/L (ref 98–111)
Creatinine, Ser: 0.76 mg/dL (ref 0.44–1.00)
GFR, Estimated: >60 mL/min (ref >60)
Glucose, Bld: 118 mg/dL — ABNORMAL HIGH (ref 70–99)
Potassium: 3.4 mmol/L — ABNORMAL LOW (ref 3.5–5.1)
Sodium: 134 mmol/L — ABNORMAL LOW (ref 135–145)
Total Bilirubin: 0.5 mg/dL (ref 0.0–1.2)
Total Protein: 6.1 g/dL — ABNORMAL LOW (ref 6.5–8.1)

## 2024-06-09 LAB — CBC
HCT: 35.5 % — ABNORMAL LOW (ref 36.0–46.0)
Hemoglobin: 11.4 g/dL — ABNORMAL LOW (ref 12.0–15.0)
MCH: 31.1 pg (ref 26.0–34.0)
MCHC: 32.1 g/dL (ref 30.0–36.0)
MCV: 97 fL (ref 80.0–100.0)
Platelets: 232 K/uL (ref 150–400)
RBC: 3.66 MIL/uL — ABNORMAL LOW (ref 3.87–5.11)
RDW: 12.4 % (ref 11.5–15.5)
WBC: 6.9 K/uL (ref 4.0–10.5)
nRBC: 0 % (ref 0.0–0.2)

## 2024-06-09 LAB — HEPARIN LEVEL (UNFRACTIONATED): Heparin Unfractionated: >1.1 [IU]/mL — ABNORMAL HIGH (ref 0.30–0.70)

## 2024-06-09 LAB — PROTIME-INR
INR: 1.3 — ABNORMAL HIGH (ref 0.8–1.2)
Prothrombin Time: 16.7 s — ABNORMAL HIGH (ref 11.4–15.2)

## 2024-06-09 MED ORDER — HYDRALAZINE HCL 20 MG/ML IJ SOLN
5.0000 mg | INTRAMUSCULAR | Status: DC | PRN
  Administered 2024-06-09 – 2024-06-13 (×2): 5 mg via INTRAVENOUS
  Filled 2024-06-09 (×2): qty 1

## 2024-06-09 MED ORDER — POTASSIUM CHLORIDE ER 10 MEQ PO TBCR
20.0000 meq | EXTENDED_RELEASE_TABLET | Freq: Once | ORAL | Status: AC
  Administered 2024-06-09: 20 meq via ORAL
  Filled 2024-06-09: qty 2

## 2024-06-09 MED ORDER — METOPROLOL TARTRATE 25 MG PO TABS
25.0000 mg | ORAL_TABLET | Freq: Two times a day (BID) | ORAL | Status: DC
  Administered 2024-06-09 – 2024-06-13 (×9): 25 mg via ORAL
  Filled 2024-06-09 (×10): qty 1

## 2024-06-09 MED ORDER — LOSARTAN POTASSIUM 50 MG PO TABS
50.0000 mg | ORAL_TABLET | Freq: Every day | ORAL | Status: DC
  Filled 2024-06-09: qty 1

## 2024-06-09 NOTE — Progress Notes (Signed)
                                                                                                                                                          Daily Progress Note   Patient Name: Gina Duncan       Date: 06/09/2024 DOB: August 07, 1941  Age: 83 y.o. MRN#: [id number] Attending Physician: Bryn Bernardino NOVAK, MD Primary Care Physician: [name], MD Admit Date: 06/06/2024  Reason for Consultation/Follow-up: {Reason for Consult:23484}  Subjective: ***  Today, ***  Chart review/care coordination:  Completed extensive chart review including EPIC notes, ***. Coordinated care with ****.    Length of Stay: 3   Physical Exam          Vital Signs: BP (!) 144/44   Pulse 74   Temp 98.4 F (36.9 C) (Oral)   Resp (!) 27   Ht 5' 5 (1.651 m)   Wt 64 kg   SpO2 100%   BMI 23.46 kg/m  SpO2: SpO2: 100 % O2 Device: O2 Device: Nasal Cannula O2 Flow Rate: O2 Flow Rate (L/min): 2 L/min      Palliative Assessment/Data:   Palliative Care Assessment & Plan   Patient Profile/Assessment:  ***   Recommendations/Plan: ***   Symptom management:  ***  Prognosis:  {Palliative Care Prognosis:23504}    Discharge Planning: {Palliative dispostion:23505}    Detailed review of medical records (labs, imaging, vital signs), medically appropriate exam, discussed with treatment team, counseling and education to patient, family, & staff, documenting clinical information, medication management, coordination of care   Total time: I spent *** minutes in the care of the patient today in the above activities and documenting the encounter.   Billing based on MDM: ***  {Problems Addressed:304933}  {Amount and/or Complexity of Ijuj:695065}  {Risks:304936}         [name], NP  Palliative Medicine Team Team phone # [phone]  Thank you for allowing the Palliative Medicine Team to assist in the care of this patient. Please utilize secure chat with additional  questions, if there is no response within 30 minutes please call the above phone number.  Palliative Medicine Team providers are available by phone from 7am to 7pm daily and can be reached through the team cell phone.  Should this patient require assistance outside of these hours, please call the patient's attending physician.

## 2024-06-09 NOTE — Progress Notes (Signed)
 [facility] PROGRESS NOTE  Gina Duncan (DOB: 1940-09-25) FMW:[id number] PCP: [name], MD Outpatient Specialists: Cardiology, Dr. Lavona  Brief Narrative: Gina DASEN. Galbreath is an 83 y.o. female with a history of PAF on eliquis , CAD, GAD, HTN, HFrEF, endometrial CA who presented to the ED on 06/06/2024 with numerous complaints, including diffuse mild abdominal pain, has developed cough,  chest congestion and throwing up.  She also reports generalized malaise and fatigue which has been progressive and getting worse. She was tachycardic, tachypneic, hypertensive in the ED. CT abdomen and pelvis ruled out pulmonary embolism but shows large rim-enhancing mass in the left lobe of liver with associated segmental biliary duct obstruction and atrophy of left lobe finding consistent with malignancy.    She was admitted with GI and oncology consultations. Hospitalization complicated by AFib with RVR for which amiodarone was initiated. Hypotension required brief levophed infusion. Cardiology has been following and palliative care has also been consulted.    GI has recommended IR biopsy. IR has reviewed the case and images, recommends continuing heparin for anticoagulation and stopping 4 hours prior to procedure, will plan U/S guided biopsy, possibly with paracentesis as well.   Subjective: Comfortable in decision to get biopsy and potentially pursue treatment. Has no chest pain or dyspnea. She and spouse continue to desire to transfer to [facility].  Objective: BP 116/63   Pulse 70   Temp 98.7 F (37.1 C) (Oral)   Resp 20   Ht 5' 5 (1.651 m)   Wt 64 kg   SpO2 99%   BMI 23.46 kg/m   Gen: Elderly pleasant female in no acute distress Pulm: Crackles at bases improving with repeated deep inspirations, nonlabored, off O2 now  CV: Regular with frequent PACs on monitor, no LE edema GI: Soft, protuberant without tenderness, +BS Neuro: Alert and oriented. No new focal deficits. Ext: Warm, no  deformities. Skin: No rashes, lesions or ulcers on visualized skin   Assessment & Plan: Suspected sepsis due to pneumonia with mixed shock state: Hypotension related to AFib w/RVR, sepsis, hypovolemia, now resolved.  Patient presented with tachycardia, tachypnea, nausea, vomiting, elevated liver enzymes, met SIRS criteria.   CTA chest shows suspected atelectasis, ruled out PE. - Continue ceftriaxone  and azithromycin  - Follow up blood cultures - Weaned from pressor, improved after return to NSR.  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  - Needs tissue diagnosis. D/w Dr. Vanice who believes this is amenable to U/S-guided biopsy, will make NPO p MN. Scheduled for 10/10. Hold heparin for 4 hours prior to procedure. - Monitor LFTs, Follow up tumor markers. - GI following.    CAD with demand ischemia; - Hx LAD PCI 2017, had some chest pain with rapid rates that subsided, mildly elevated Tn and flat. No further intervention planned. Continue statin, anticoagulation.    Essential hypertension; - Was hypotensive requiring levophed, not rising BP, restart metoprolol  home med. If still elevated restart losartan  stepwise.    PAF, A-fib with RVR, Hypotension: - Converted to NSR 10/8 AM, converted amiodarone gtt to po per cardiology. 200mg  po BID x3 weeks, then 200mg  once daily (watch LFTs)  - Continue heparin gtt in place of PTA eliquis , though can switch back to DOAC after biopsy.  - Echocardiogram pending.  - Pt of Dr. Denver. Note patient/family would prefer to be hospitalized at [facility].    Hyperlipidemia : -  Continue rosuvastatin .   Goals of care discussion: - Extensive discussions with multiple disciplines and family members. Pt opts for full code, would like to pursue treatments depending on the outcome of the biopsy. Palliative has also  engaged with patient/family.   Bernardino KATHEE Come, MD [facility] [email or web address] 06/09/2024, 5:50 PM

## 2024-06-09 NOTE — Progress Notes (Signed)
 PHARMACY - ANTICOAGULATION CONSULT NOTE  Pharmacy Consult for heparin Indication: atrial fibrillation  No Known Allergies  Patient Measurements: Height: 5' 5 (165.1 cm) Weight: 64 kg (141 lb) IBW/kg (Calculated) : 57 HEPARIN DW (KG): 59  Vital Signs: Temp: 98.4 F (36.9 C) (10/09 0818) Temp Source: Oral (10/09 0818) BP: 144/44 (10/09 0600) Pulse Rate: 74 (10/09 0600)  Labs: Recent Labs    06/07/24 0451 06/07/24 0451 06/08/24 0635 06/08/24 1555 06/09/24 0019 06/09/24 0736  HGB 11.7*  --  12.1  --  11.4*  --   HCT 36.0  --  37.7  --  35.5*  --   PLT 222  --  308  --  232  --   APTT  --    < > 50* 49* 89* 61*  LABPROT  --   --   --   --  16.7*  --   INR  --   --   --   --  1.3*  --   HEPARINUNFRC  --   --  >1.10*  --  >1.10*  --   CREATININE 0.63  --  0.84  --  0.76  --    < > = values in this interval not displayed.    Estimated Creatinine Clearance: 50.5 mL/min (by C-G formula based on SCr of 0.76 mg/dL).  Assessment: 83 year old female with history of afib on apixaban  prior to admit. Patient with possible need for liver biopsy this admit. Discussed with primary team will transition patient to heparin infusion tonight as apixaban  was given this morning( 10/7). No bleeding is noted.   CBC WNL APTT 61- slightly subtherapeutic HL>1.1, not correlating  Goal of Therapy:  Heparin level 0.3-0.7 units/ml aPTT 66-102 seconds Monitor platelets by anticoagulation protocol: Yes   Plan:  Increase heparin infusion to 1200 units/hr aPTT in 8 hours Continue to monitor H&H and platelets Follow up plans for surgery/biopsy  Elspeth Sour, PharmD Clinical Pharmacist 06/09/2024 9:15 AM

## 2024-06-09 NOTE — Progress Notes (Signed)
 PHARMACY - ANTICOAGULATION CONSULT NOTE  Pharmacy Consult for heparin Indication: atrial fibrillation  No Known Allergies  Patient Measurements: Height: 5' 5 (165.1 cm) Weight: 64 kg (141 lb) IBW/kg (Calculated) : 57 HEPARIN DW (KG): 59  Vital Signs: Temp: 98.2 F (36.8 C) (10/09 2018) Temp Source: Oral (10/09 2018) BP: 172/68 (10/09 2018) Pulse Rate: 87 (10/09 2018)  Labs: Recent Labs    06/07/24 0451 06/07/24 0451 06/08/24 0635 06/08/24 1555 06/09/24 0019 06/09/24 0736  HGB 11.7*  --  12.1  --  11.4*  --   HCT 36.0  --  37.7  --  35.5*  --   PLT 222  --  308  --  232  --   APTT  --    < > 50* 49* 89* 61*  LABPROT  --   --   --   --  16.7*  --   INR  --   --   --   --  1.3*  --   HEPARINUNFRC  --   --  >1.10*  --  >1.10*  --   CREATININE 0.63  --  0.84  --  0.76  --    < > = values in this interval not displayed.    Estimated Creatinine Clearance: 50.5 mL/min (by C-G formula based on SCr of 0.76 mg/dL).  Assessment: 83 year old female with history of afib on apixaban  prior to admit. Patient with possible need for liver biopsy this admit. Discussed with primary team will transition patient to heparin infusion tonight as apixaban  was given this morning( 10/7). No bleeding is noted.   CBC WNL APTT 61- slightly subtherapeutic HL>1.1, not correlating  Goal of Therapy:  Heparin level 0.3-0.7 units/ml aPTT 66-102 seconds Monitor platelets by anticoagulation protocol: Yes   Plan:  Increase heparin infusion to 1200 units/hr aPTT in 8 hours Continue to monitor H&H and platelets Follow up plans for surgery/biopsy  Elspeth Sour, PharmD Clinical Pharmacist 06/09/2024 10:11 PM  ____________________________  10/9 PM - transferred to [facility] for US -guided biopsy  Phlebotomy drew aPTT at 20:15, lab received at 20:49 and resulted as aPTT 63 sec (subtherapeutic) but didn't pull through EPIC.  Confirmed with tech twice - unclear why it's not pulling through EPIC.    Plan  -  Increase heparin IV to 1300 units/hr, 8 hour heparin level.  F/u biopsy timing for heparin hold.   Maurilio Fila, PharmD Clinical Pharmacist 06/09/2024  10:14 PM

## 2024-06-09 NOTE — Progress Notes (Signed)
 PHARMACY - ANTICOAGULATION CONSULT NOTE  Pharmacy Consult for heparin Indication: atrial fibrillation  No Known Allergies  Patient Measurements: Height: 5' 5 (165.1 cm) Weight: 64 kg (141 lb) IBW/kg (Calculated) : 57 HEPARIN DW (KG): 59  Vital Signs: Temp: 98 F (36.7 C) (10/09 0000) Temp Source: Axillary (10/09 0000) BP: 113/35 (10/09 0000) Pulse Rate: 62 (10/09 0000)  Labs: Recent Labs    06/06/24 0755 06/06/24 0831 06/07/24 0451 06/08/24 0635 06/08/24 1555 06/09/24 0019  HGB 13.2  --  11.7* 12.1  --  11.4*  HCT 40.1  --  36.0 37.7  --  35.5*  PLT 215  --  222 308  --  232  APTT  --   --   --  50* 49* 89*  LABPROT  --  18.4*  --   --   --   --   INR  --  1.5*  --   --   --   --   HEPARINUNFRC  --   --   --  >1.10*  --  >1.10*  CREATININE 0.81  --  0.63 0.84  --   --   CKTOTAL  --  36*  --   --   --   --     Estimated Creatinine Clearance: 48.1 mL/min (by C-G formula based on SCr of 0.84 mg/dL).  Assessment: 83 year old female with history of afib on apixaban  prior to admit. Patient with possible need for liver biopsy this admit. Discussed with primary team will transition patient to heparin infusion tonight as apixaban  was given this morning( 10/7). No bleeding is noted.   Hgb 11s, plts 232 APTT = 89 seconds, therapeutic, no issues with infusion reported HL>1.1, not correlating  Goal of Therapy:  Heparin level 0.3-0.7 units/ml aPTT 66-102 seconds Monitor platelets by anticoagulation protocol: Yes   Plan:  Continue heparin infusion at 1100 units/hr Check confirmatory aPTT level in 8 hours and then aPTT and heparin level daily while on heparin Continue to monitor H&H and platelets Follow up plans for surgery/biopsy  Lynwood Poplar, PharmD, BCPS Clinical Pharmacist 06/09/2024 1:10 AM

## 2024-06-09 NOTE — Plan of Care (Signed)

## 2024-06-09 NOTE — Progress Notes (Signed)
 Report has been given to [employer].

## 2024-06-09 NOTE — Progress Notes (Signed)
 [facility] has been called to set up transport.

## 2024-06-09 NOTE — Plan of Care (Signed)

## 2024-06-10 ENCOUNTER — Inpatient Hospital Stay (HOSPITAL_COMMUNITY)

## 2024-06-10 DIAGNOSIS — A419 Sepsis, unspecified organism: Secondary | ICD-10-CM | POA: Diagnosis not present

## 2024-06-10 DIAGNOSIS — I4891 Unspecified atrial fibrillation: Secondary | ICD-10-CM | POA: Diagnosis not present

## 2024-06-10 DIAGNOSIS — C541 Malignant neoplasm of endometrium: Secondary | ICD-10-CM | POA: Diagnosis not present

## 2024-06-10 DIAGNOSIS — I10 Essential (primary) hypertension: Secondary | ICD-10-CM

## 2024-06-10 LAB — HEPARIN LEVEL (UNFRACTIONATED)
Heparin Unfractionated: >1.1 [IU]/mL — ABNORMAL HIGH (ref 0.30–0.70)
Heparin Unfractionated: >1.1 [IU]/mL — ABNORMAL HIGH (ref 0.30–0.70)

## 2024-06-10 LAB — BRAIN NATRIURETIC PEPTIDE: B Natriuretic Peptide: 815.8 pg/mL — ABNORMAL HIGH (ref 0.0–100.0)

## 2024-06-10 LAB — CBC
HCT: 38.1 % (ref 36.0–46.0)
Hemoglobin: 12.3 g/dL (ref 12.0–15.0)
MCH: 30.9 pg (ref 26.0–34.0)
MCHC: 32.3 g/dL (ref 30.0–36.0)
MCV: 95.7 fL (ref 80.0–100.0)
Platelets: 266 K/uL (ref 150–400)
RBC: 3.98 MIL/uL (ref 3.87–5.11)
RDW: 12.3 % (ref 11.5–15.5)
WBC: 6.8 K/uL (ref 4.0–10.5)
nRBC: 0 % (ref 0.0–0.2)

## 2024-06-10 LAB — COMPREHENSIVE METABOLIC PANEL WITH GFR
ALT: 10 U/L (ref 0–44)
AST: 17 U/L (ref 15–41)
Albumin: 2.3 g/dL — ABNORMAL LOW (ref 3.5–5.0)
Alkaline Phosphatase: 61 U/L (ref 38–126)
Anion gap: 10 (ref 5–15)
BUN: 7 mg/dL — ABNORMAL LOW (ref 8–23)
CO2: 31 mmol/L (ref 22–32)
Calcium: 8.4 mg/dL — ABNORMAL LOW (ref 8.9–10.3)
Chloride: 95 mmol/L — ABNORMAL LOW (ref 98–111)
Creatinine, Ser: 0.8 mg/dL (ref 0.44–1.00)
GFR, Estimated: >60 mL/min (ref >60)
Glucose, Bld: 106 mg/dL — ABNORMAL HIGH (ref 70–99)
Potassium: 4.5 mmol/L (ref 3.5–5.1)
Sodium: 136 mmol/L (ref 135–145)
Total Bilirubin: 0.9 mg/dL (ref 0.0–1.2)
Total Protein: 6.1 g/dL — ABNORMAL LOW (ref 6.5–8.1)

## 2024-06-10 LAB — APTT
aPTT: 77 s — ABNORMAL HIGH (ref 24–36)
aPTT: 49 s — ABNORMAL HIGH (ref 24–36)

## 2024-06-10 MED ORDER — HEPARIN (PORCINE) 25000 UT/250ML-% IV SOLN
1400.0000 [IU]/h | INTRAVENOUS | Status: DC
  Administered 2024-06-10: 1300 [IU]/h via INTRAVENOUS
  Administered 2024-06-11: 1400 [IU]/h via INTRAVENOUS
  Filled 2024-06-10: qty 250

## 2024-06-10 MED ORDER — FUROSEMIDE 10 MG/ML IJ SOLN
20.0000 mg | Freq: Once | INTRAMUSCULAR | Status: AC
  Administered 2024-06-10: 20 mg via INTRAVENOUS
  Filled 2024-06-10: qty 2

## 2024-06-10 MED ORDER — LOSARTAN POTASSIUM 25 MG PO TABS
25.0000 mg | ORAL_TABLET | Freq: Every day | ORAL | Status: DC
  Administered 2024-06-10 – 2024-06-13 (×4): 25 mg via ORAL
  Filled 2024-06-10 (×4): qty 1

## 2024-06-10 NOTE — Plan of Care (Signed)
  Problem: Clinical Measurements: Goal: Will remain free from infection Outcome: Progressing Goal: Cardiovascular complication will be avoided Outcome: Progressing   Problem: Coping: Goal: Level of anxiety will decrease Outcome: Progressing   Problem: Pain Managment: Goal: General experience of comfort will improve and/or be controlled Outcome: Progressing   Problem: Safety: Goal: Ability to remain free from injury will improve Outcome: Progressing   Problem: Skin Integrity: Goal: Risk for impaired skin integrity will decrease Outcome: Progressing

## 2024-06-10 NOTE — Progress Notes (Signed)
 Interventional Radiology Brief Note:  Patient with widely variable BP over the past 24 hrs uncharacteristic of her baseline this admission.  Effort made by medical team/RN to manage BP today has lead to improvement to 154/69 (MAP 97).  Will plan to hold on biopsy today given high risk procedure and will defer until BP more stable with MAP <90.   IR will re-evaluate for possible biopsy if she remains inpatient early next week.   Gina Balik, MS RD PA-C

## 2024-06-10 NOTE — Progress Notes (Signed)
Heparin stopped.

## 2024-06-10 NOTE — Progress Notes (Signed)
 Mobility Specialist Progress Note:   06/10/24 1350  Mobility  Activity Pivoted/transferred from bed to chair  Level of Assistance Minimal assist, patient does 75% or more  Assistive Device Other (Comment) (HHA)  Distance Ambulated (ft) 5 ft  Activity Response Tolerated well  Mobility Referral Yes  Mobility visit 1 Mobility  Mobility Specialist Start Time (ACUTE ONLY) 1340  Mobility Specialist Stop Time (ACUTE ONLY) 1350  Mobility Specialist Time Calculation (min) (ACUTE ONLY) 10 min   Received pt in bed and eager for OOB mobility. Found pt on 2 L/min O2. Pt required MinA STS, otherwise MinG. No c/o. Left pt in chair. Personal belongings and call light within reach. RN and NT aware. All needs met.   Lavanda Pollack Mobility Specialist  Please contact via [employer] or  Rehab Office [phone]

## 2024-06-10 NOTE — Progress Notes (Signed)
 [facility]  PROGRESS NOTE  Gina Duncan FMW:[id number] DOB: 04/30/1941 DOA: 06/06/2024 PCP: [name], MD   Brief HPI:   83 y.o. female with a history of PAF on eliquis , CAD, GAD, HTN, HFrEF, endometrial CA who presented to the ED on 06/06/2024 with numerous complaints, including diffuse mild abdominal pain, has developed cough,  chest congestion and throwing up.  She also reports generalized malaise and fatigue which has been progressive and getting worse. She was tachycardic, tachypneic, hypertensive in the ED. CT abdomen and pelvis ruled out pulmonary embolism but shows large rim-enhancing mass in the left lobe of liver with associated segmental biliary duct obstruction and atrophy of left lobe finding consistent with malignancy.    She was admitted with GI and oncology consultations. Hospitalization complicated by AFib with RVR for which amiodarone was initiated. Hypotension required brief levophed infusion. Cardiology has been following and palliative care has also been consulted.    GI has recommended IR biopsy. IR has reviewed the case and images, recommends continuing heparin for anticoagulation and stopping 4 hours prior to procedure, will plan U/S guided biopsy, possibly with paracentesis as well.     Assessment/Plan:   Suspected sepsis due to pneumonia with mixed shock state: Hypotension related to AFib w/RVR, sepsis, hypovolemia, now resolved.  Patient presented with tachycardia, tachypnea, nausea, vomiting, elevated liver enzymes, met SIRS criteria.   CTA chest shows suspected atelectasis, ruled out PE. - Continue ceftriaxone  and azithromycin  - Follow up blood cultures - Weaned from pressor, improved after return to NSR.  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  - Needs tissue diagnosis. D/w Dr. Vanice who believes this is amenable to U/S-guided biopsy, will make NPO p MN. Scheduled for 10/10. Hold heparin for 4 hours prior to procedure. -Biopsy was postponed due to hypertension and oxygen requirement Biopsy scheduled for 06/13/2024 - Monitor LFTs, Follow up tumor markers. - GI following.    CAD with demand ischemia; - Hx LAD PCI 2017, had some chest pain with rapid rates that subsided, mildly elevated Tn and flat. No further intervention planned. Continue statin, anticoagulation.    Essential hypertension; - Was hypotensive requiring levophed - Blood pressure is now elevated, will restart losartan  -Continue metoprolol    PAF, A-fib with RVR, Hypotension: - Converted to NSR 10/8 AM, converted amiodarone gtt to po per cardiology. 200mg  po BID x3 weeks, then 200mg  once daily (watch LFTs)  - Continue heparin gtt in place of PTA eliquis , though can switch back to DOAC after biopsy.  - Echocardiogram showed EF 35%, grade 1 diastolic dysfunction - Pt of Dr. Denver. Note patient/family would prefer to be hospitalized at [facility].  -Continue metoprolol    Hyperlipidemia : - Continue rosuvastatin .   Goals of care discussion: - Extensive discussions with multiple disciplines and family members. Pt opts for full code, would like to pursue treatments depending on the outcome of the biopsy. Palliative has also engaged with patient/family.     Medications     amiodarone  200 mg Oral BID   Chlorhexidine Gluconate Cloth  6 each Topical Daily   docusate sodium  100 mg Oral BID   metoprolol   tartrate  25 mg Oral BID   rosuvastatin   5 mg Oral Daily     Data Reviewed:   CBG:  No results for input(s): GLUCAP in the last 168 hours.  SpO2: 98 % O2 Flow Rate  (L/min): 2 L/min    Vitals:   06/09/24 1900 06/09/24 2018 06/09/24 2239 06/10/24 0541  BP:  (!) 172/68 (!) 182/66   Pulse: 84 87 86 85  Resp: (!) 21 18  17   Temp:  98.2 F (36.8 C)  98 F (36.7 C)  TempSrc:  Oral  Oral  SpO2: 98% 98%  98%  Weight:      Height:          Data Reviewed:  Basic Metabolic Panel: Recent Labs  Lab 06/06/24 0755 06/07/24 0451 06/08/24 0635 06/09/24 0019 06/10/24 0517  NA 134* 135 133* 134* 136  K 3.9 3.5 3.6 3.4* 4.5  CL 94* 96* 95* 98 95*  CO2 28 26 25 29 31   GLUCOSE 126* 89 146* 118* 106*  BUN 12 10 15 12  7*  CREATININE 0.81 0.63 0.84 0.76 0.80  CALCIUM  9.5 8.6* 8.6* 8.6* 8.4*  MG  --  1.4* 2.1  --   --   PHOS  --  2.9 3.0  --   --     CBC: Recent Labs  Lab 06/06/24 0755 06/07/24 0451 06/08/24 0635 06/09/24 0019 06/10/24 0517  WBC 6.8 5.7 8.4 6.9 6.8  HGB 13.2 11.7* 12.1 11.4* 12.3  HCT 40.1 36.0 37.7 35.5* 38.1  MCV 95.5 95.7 96.4 97.0 95.7  PLT 215 222 308 232 266    LFT Recent Labs  Lab 06/06/24 0755 06/06/24 0834 06/07/24 0451 06/09/24 0019 06/10/24 0517  AST 21 23 18 22 17   ALT 8 9 7 6 10   ALKPHOS 87 89 76 79 61  BILITOT 2.6* 2.6* 1.5* 0.5 0.9  PROT 7.7 7.8 6.2* 6.1* 6.1*  ALBUMIN 4.0 4.0 3.1* 2.9* 2.3*     Antibiotics: Anti-infectives (From admission, onward)    Start     Dose/Rate Route Frequency Ordered Stop   06/07/24 1000  cefTRIAXone  (ROCEPHIN ) 1 g in sodium chloride  0.9 % 100 mL IVPB        1 g 200 mL/hr over 30 Minutes Intravenous Every 24 hours 06/06/24 1226 06/12/24 0959   06/07/24 1000  azithromycin  (ZITHROMAX ) 500 mg in sodium chloride  0.9 % 250 mL IVPB        500 mg 250 mL/hr over 60 Minutes Intravenous Every 24 hours 06/06/24 1226 06/12/24 0959   06/06/24 1200  cefTRIAXone  (ROCEPHIN ) 2 g in sodium chloride  0.9 % 100 mL IVPB        2 g 200 mL/hr over 30 Minutes Intravenous Once 06/06/24 1155 06/06/24 1732   06/06/24 1200  azithromycin  (ZITHROMAX ) 500 mg in sodium chloride  0.9 % 250 mL  IVPB        500 mg 250 mL/hr over 60 Minutes Intravenous  Once 06/06/24 1155 06/06/24 1732        DVT prophylaxis: Heparin  Code Status: Full code  Family Communication: Discussed with patient's husband at bedside   CONSULTS    Subjective   Blood pressure has been elevated, requiring 2 L/min of oxygen.  Patient is not on oxygen at home   Objective    Physical Examination:   General-appears in no acute distress Heart-S1-S2, regular, no murmur auscultated Lungs-bibasilar crackles auscultated Abdomen-soft, nontender, no organomegaly Extremities-no edema in the lower extremities Neuro-alert, oriented x3, no  focal deficit noted   Status is: Inpatient:             Sabas GORMAN Brod   [facility] If 7PM-7AM, please contact night-coverage at [email or web address], Office  [phone]   06/10/2024, 8:30 AM  LOS: 4 days

## 2024-06-10 NOTE — Progress Notes (Signed)
 PHARMACY - ANTICOAGULATION CONSULT NOTE  Pharmacy Consult for heparin Indication: atrial fibrillation  No Known Allergies  Patient Measurements: Height: 5' 5 (165.1 cm) Weight: 64 kg (141 lb) IBW/kg (Calculated) : 57 HEPARIN DW (KG): 59  Vital Signs: Temp: 98 F (36.7 C) (10/10 0541) Temp Source: Oral (10/10 0541) BP: 182/66 (10/09 2239) Pulse Rate: 85 (10/10 0541)  Labs: Recent Labs    06/08/24 0635 06/08/24 1555 06/09/24 0019 06/09/24 0736 06/09/24 2015 06/10/24 0517  HGB 12.1  --  11.4*  --   --  12.3  HCT 37.7  --  35.5*  --   --  38.1  PLT 308  --  232  --   --  266  APTT 50*   < > 89* 61* 63*  --   LABPROT  --   --  16.7*  --   --   --   INR  --   --  1.3*  --   --   --   HEPARINUNFRC >1.10*  --  >1.10*  --   --  >1.10*  CREATININE 0.84  --  0.76  --   --  0.80   < > = values in this interval not displayed.    Estimated Creatinine Clearance: 50.5 mL/min (by C-G formula based on SCr of 0.8 mg/dL).  Assessment: 83 year old female with history of afib on apixaban  prior to admit. Patient with possible need for liver biopsy this admit. Discussed with primary team will transition patient to heparin infusion tonight as apixaban  was given this morning( 10/7). No bleeding is noted.   HL > 1.1, not correlating. CBC WNL, aPTT 63, slightly subtherapeutic.   Goal of Therapy:  Heparin level 0.3-0.7 units/ml aPTT 66-102 seconds Monitor platelets by anticoagulation protocol: Yes   Plan:  Increase heparin infusion at 1350 units/hr Check heparin level in 8 hours and daily while on heparin Continue to monitor H&H and platelets  Thank you for allowing pharmacy to be a part of this patient's care.  Shelba Collier, PharmD, BCPS Clinical Pharmacist

## 2024-06-10 NOTE — Progress Notes (Signed)
 PHARMACY - ANTICOAGULATION CONSULT NOTE  Pharmacy Consult for heparin Indication: atrial fibrillation  No Known Allergies  Patient Measurements: Height: 5' 5 (165.1 cm) Weight: 64 kg (141 lb) IBW/kg (Calculated) : 57 HEPARIN DW (KG): 59  Vital Signs: Temp: 98.3 F (36.8 C) (10/10 2010) Temp Source: Oral (10/10 2010) BP: 130/53 (10/10 2014) Pulse Rate: 71 (10/10 2014)  Labs: Recent Labs    06/08/24 0635 06/08/24 1555 06/09/24 0019 06/09/24 0736 06/09/24 2015 06/10/24 0517 06/10/24 0821 06/10/24 2007  HGB 12.1  --  11.4*  --   --  12.3  --   --   HCT 37.7  --  35.5*  --   --  38.1  --   --   PLT 308  --  232  --   --  266  --   --   APTT 50*   < > 89*   < > 63*  --  77* 49*  LABPROT  --   --  16.7*  --   --   --   --   --   INR  --   --  1.3*  --   --   --   --   --   HEPARINUNFRC >1.10*  --  >1.10*  --   --  >1.10*  --  >1.10*  CREATININE 0.84  --  0.76  --   --  0.80  --   --    < > = values in this interval not displayed.    Estimated Creatinine Clearance: 50.5 mL/min (by C-G formula based on SCr of 0.8 mg/dL).  Assessment: 83 year old female with history of afib (Chadsvasc = 6) on apixaban  prior to admit. Patient with possible need for liver biopsy this admit. Discussed with primary team will transition patient to heparin infusion tonight as apixaban  was given this morning( 10/7). No bleeding is noted.   Transferred to [facility] 10/9 for US -guided biopsy   Heparin paused @0800  this morning with plans for biopsy.  Now deferred to 10/13 with HTN and O2 requirement.  Heparin restarted @ 1400.  Initial aPTT 49 sec is below goal on 1300 units/hr.  Per night RN, no infusion issues or interruptions.    Goal of Therapy:  Heparin level 0.3-0.7 units/ml aPTT 66-102 seconds Monitor platelets by anticoagulation protocol: Yes   Plan:  Increase heparin infusion to 1400 units/hr aPTT in 8 hours Continue to monitor H&H and platelets Follow up plans for  surgery/biopsy  Maurilio Fila, PharmD Clinical Pharmacist 06/10/2024  9:33 PM

## 2024-06-10 NOTE — Consult Note (Addendum)
 Chief Complaint: New liver masses - IR consulted for image guided biopsy  Referring Provider(s): Gina Bernardino NOVAK, MD   Supervising Physician: Gina [facility]  Patient Status: Gina Duncan - In-pt  History of Present Illness: Gina Duncan is a 83 y.o. female with pmhx of CAD, a fib, HTN endometrial cancer. admitted on 10.6. with abdominal pain. nausea and vomiting. MR abd from 10.6 shows new liver lesions.  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 Metastatic disease from colon cancer could have a similar appearance but is felt less likely. Per Palliative note dated 58.8.25 Patient Would consider cancer directed therapies depending on prognosis etc. Therefore appropriate for further workup. Pt has been transferred from [facility] to [facility]. Team is requesting liver lesion biopsy for further evaluation.   Today pt resting comfortably in bed without complaint.    Patient is Full Code  Past Medical History:  Diagnosis Date   CAD (coronary artery disease)    DES to LAD 2017   Endometrial cancer (HCC)    Hypertension    Radiation 06/01/15, 06/12/15, 06/15/15, 06/19/15, 06/21/15   proximal vagina 30 gray    Past Surgical History:  Procedure Laterality Date   cardiac stents  2017   CARDIOVERSION N/A 04/21/2022   Procedure: CARDIOVERSION;  Surgeon: Gina Wilbert SAUNDERS, MD;  Location: [facility] ENDOSCOPY;  Service: Cardiovascular;  Laterality: N/A;   CHOLECYSTECTOMY     ROBOTIC ASSISTED TOTAL HYSTERECTOMY WITH BILATERAL SALPINGO OOPHERECTOMY  02/13/2015   by Dr. Arlee   TEE WITHOUT CARDIOVERSION N/A 04/21/2022   Procedure: TRANSESOPHAGEAL ECHOCARDIOGRAM (TEE);  Surgeon: Gina Wilbert SAUNDERS, MD;  Location: [facility] ENDOSCOPY;  Service: Cardiovascular;  Laterality: N/A;    Allergies: Patient has no known allergies.  Medications: Prior to Admission  medications   Medication Sig Start Date End Date Taking? Authorizing Provider  acetaminophen  (TYLENOL ) 500 MG tablet Take 500 mg by mouth every 6 (six) hours as needed for moderate pain (pain score 4-6).   Yes [provider]  apixaban  (ELIQUIS ) 5 MG TABS tablet Take 1 tablet (5 mg total) by mouth 2 (two) times daily. 02/24/24  Yes Gina Agent, MD  losartan  (COZAAR ) 50 MG tablet Take 50 mg by mouth daily.   Yes [provider]  magnesium  oxide (MAG-OX) 400 MG tablet Take 1 tablet (400 mg total) by mouth daily. 06/11/22  Yes Gina Agent, MD  metoprolol  tartrate (LOPRESSOR ) 25 MG tablet Take 1 tablet (25 mg total) by mouth 2 (two) times daily. 10/29/23  Yes Gina Agent, MD  omeprazole (PRILOSEC OTC) 20 MG tablet Take 20 mg by mouth daily as needed (for heartburn).   Yes [provider]  rosuvastatin  (CRESTOR ) 5 MG tablet Take 1 tablet by mouth once daily 04/20/24  Yes Gina Agent, MD     Family History  Problem Relation Age of Onset   Diabetes Mother    Peripheral vascular disease Mother    Stroke Father    Breast cancer Daughter     Social History   Socioeconomic History   Marital status: Married    Spouse name: Not on file   Number of children: 2   Years of education: Not on file   Highest education level: Not on file  Occupational History   Not on file  Tobacco Use   Smoking status: Former    Types: Cigarettes   Smokeless tobacco: Former  Substance and  Sexual Activity   Alcohol use: No    Alcohol/week: 0.0 standard drinks of alcohol   Drug use: No   Sexual activity: Not on file  Other Topics Concern   Not on file  Social History Narrative   Married, 2 children and 8 grand and 5 great   Social Drivers of Corporate investment banker Strain: Not on file  Food Insecurity: No Food Insecurity (06/06/2024)   Hunger Vital Sign    Worried About Running Out of Food in the Last Year: Never true    Ran Out of Food in the Last Year: Never  true  Transportation Needs: No Transportation Needs (06/06/2024)   PRAPARE - Administrator, Civil Service (Medical): No    Lack of Transportation (Non-Medical): No  Physical Activity: Not on file  Stress: Not on file  Social Connections: Socially Integrated (06/06/2024)   Social Connection and Isolation Panel    Frequency of Communication with Friends and Family: More than three times a week    Frequency of Social Gatherings with Friends and Family: More than three times a week    Attends Religious Services: More than 4 times per year    Active Member of [employer] or Organizations: Yes    Attends Engineer, structural: More than 4 times per year    Marital Status: Married     Review of Systems: A 12 point ROS discussed and pertinent positives are indicated in the HPI above.  All other systems are negative.   Vital Signs: BP (!) 182/66   Pulse 85   Temp 98 F (36.7 C) (Oral)   Resp 17   Ht 5' 5 (1.651 m)   Wt 141 lb (64 kg)   SpO2 98%   BMI 23.46 kg/m   Advance Care Plan: No documents on file  Physical Exam Vitals and nursing note reviewed.  Constitutional:      Appearance: Normal appearance.  HENT:     Mouth/Throat:     Mouth: Mucous membranes are moist.     Pharynx: Oropharynx is clear.  Cardiovascular:     Rate and Rhythm: Normal rate and regular rhythm.  Pulmonary:     Effort: Pulmonary effort is normal.     Breath sounds: Normal breath sounds.  Abdominal:     Palpations: Abdomen is soft.     Tenderness: There is no abdominal tenderness.  Musculoskeletal:     Right lower leg: No edema.     Left lower leg: No edema.  Skin:    General: Skin is warm and dry.  Neurological:     Mental Status: She is alert and oriented to person, place, and time. Mental status is at baseline.     Imaging: ECHOCARDIOGRAM COMPLETE Result Date: 06/08/2024    ECHOCARDIOGRAM REPORT   Patient Name:   ADELITA HONE [facility] Date of Exam: 06/08/2024 Medical Rec #:   [id number]         Height:       65.0 in Accession #:    [id number]        Weight:       141.0 lb Date of Birth:  02/18/1941         BSA:          1.705 m Patient Age:    82 years          BP:           100/56 mmHg Patient Gender: F  HR:           74 bpm. Exam Location:  [facility] Procedure: 2D Echo, Cardiac Doppler and Color Doppler (Both Spectral and Color            Flow Doppler were utilized during procedure). Indications:    Murmur R01.1  History:        Patient has prior history of Echocardiogram examinations, most                 recent 04/08/2023. CHF; Risk Factors:Dyslipidemia and                 Hypertension.  Sonographer:    Aida Pizza RCS Referring Phys: [id number] VISHNU P MALLIPEDDI IMPRESSIONS  1. Left ventricular ejection fraction, by estimation, is >75%. The left ventricle has hyperdynamic function. The left ventricle has no regional wall motion abnormalities. Left ventricular diastolic parameters are consistent with Grade I diastolic dysfunction (impaired relaxation).  2. Right ventricular systolic function is normal. The right ventricular size is normal.  3. Left atrial size was severely dilated.  4. Right atrial size was mildly dilated.  5. The mitral valve is abnormal. No evidence of mitral valve regurgitation. No evidence of mitral stenosis. Moderate mitral annular calcification.  6. Tricuspid valve regurgitation is moderate.  7. The aortic valve is tricuspid. Aortic valve regurgitation is not visualized. No aortic stenosis is present.  8. The inferior vena cava is normal in size with greater than 50% respiratory variability, suggesting right atrial pressure of 3 mmHg. FINDINGS  Left Ventricle: Left ventricular ejection fraction, by estimation, is >75%. The left ventricle has hyperdynamic function. The left ventricle has no regional wall motion abnormalities. Strain was performed and the global longitudinal strain is indeterminate. The left ventricular internal cavity size was  normal in size. There is no left ventricular hypertrophy. Left ventricular diastolic function could not be evaluated due to mitral annular calcification (moderate or greater). Left ventricular diastolic parameters are consistent with Grade I diastolic dysfunction (impaired relaxation). Right Ventricle: The right ventricular size is normal. No increase in right ventricular wall thickness. Right ventricular systolic function is normal. Left Atrium: Left atrial size was severely dilated. Right Atrium: Right atrial size was mildly dilated. Pericardium: There is no evidence of pericardial effusion. Mitral Valve: The mitral valve is abnormal. Moderate mitral annular calcification. No evidence of mitral valve regurgitation. No evidence of mitral valve stenosis. MV peak gradient, 11.0 mmHg. The mean mitral valve gradient is 2.0 mmHg. Tricuspid Valve: The tricuspid valve is normal in structure. Tricuspid valve regurgitation is moderate . No evidence of tricuspid stenosis. Aortic Valve: The aortic valve is tricuspid. Aortic valve regurgitation is not visualized. No aortic stenosis is present. Pulmonic Valve: The pulmonic valve was grossly normal. Pulmonic valve regurgitation is mild. No evidence of pulmonic stenosis. Aorta: The aortic root is normal in size and structure. Venous: The inferior vena cava is normal in size with greater than 50% respiratory variability, suggesting right atrial pressure of 3 mmHg. IAS/Shunts: No atrial level shunt detected by color flow Doppler. Additional Comments: 3D was performed not requiring image post processing on an independent workstation and was indeterminate.  LEFT VENTRICLE PLAX 2D LVIDd:         4.20 cm   Diastology LVIDs:         1.90 cm   LV e' medial:    7.09 cm/s LV PW:         1.20 cm   LV E/e' medial:  23.4  LV IVS:        1.10 cm   LV e' lateral:   8.45 cm/s LVOT diam:     1.60 cm   LV E/e' lateral: 19.6 LV SV:         50 LV SV Index:   29 LVOT Area:     2.01 cm  RIGHT  VENTRICLE RV S prime:     16.10 cm/s TAPSE (M-mode): 2.7 cm LEFT ATRIUM             Index        RIGHT ATRIUM           Index LA diam:        3.90 cm 2.29 cm/m   RA Area:     18.50 cm LA Vol (A2C):   82.6 ml 48.44 ml/m  RA Volume:   51.90 ml  30.44 ml/m LA Vol (A4C):   93.3 ml 54.72 ml/m LA Biplane Vol: 88.6 ml 51.96 ml/m  AORTIC VALVE LVOT Vmax:   134.67 cm/s LVOT Vmean:  102.600 cm/s LVOT VTI:    0.247 m  AORTA Ao Root diam: 3.40 cm MITRAL VALVE                TRICUSPID VALVE MV Area (PHT): 3.03 cm     TR Peak grad:   71.9 mmHg MV Area VTI:   1.20 cm     TR Vmax:        424.00 cm/s MV Peak grad:  11.0 mmHg MV Mean grad:  2.0 mmHg     SHUNTS MV Vmax:       1.66 m/s     Systemic VTI:  0.25 m MV Vmean:      63.6 cm/s    Systemic Diam: 1.60 cm MV Decel Time: 250 msec MV E velocity: 166.00 cm/s MV A velocity: 95.90 cm/s MV E/A ratio:  1.73 Vishnu Priya Mallipeddi Electronically signed by Diannah Late Mallipeddi Signature Date/Time: 06/08/2024/4:55:15 PM    Final    MR ABDOMEN WITH MRCP W CONTRAST Result Date: 06/06/2024 EXAM: MRCP WITH IV CONTRAST 06/06/2024 06:52:40 PM TECHNIQUE: Multisequence, multiplanar magnetic resonance images of the abdomen with intravenous contrast. MRCP sequences were performed. 7 mL (gadobutrol (GADAVIST) 1 MMOL/ML injection 7 mL GADOBUTROL 1 MMOL/ML IV SOLN). COMPARISON: CT of earlier today. CLINICAL HISTORY: [id number] Liver mass 203554. Table formatting from the original note was not included.; Liver mass Liver mass. FINDINGS: LIMITATIONS/ARTIFACTS: Mild motion degradation throughout. LIVER: No cirrhosis. Segment 4a 3.8 x 5.1 cm ill-defined mass with delayed hyper-enhancement including on image 39 / 27. GALLBLADDER AND BILIARY SYSTEM: Cholecystectomy. Marked intrahepatic biliary duct dilatation including throughout the majority of the left hepatic lobe and minimally into segment 8, secondary to the segment 4a mass. Just upstream from the obstruction, an intraductal stone of 9 mm is  identified included on image 19 / 4 and 9 / 3. No common duct dilatation. SPLEEN: Unremarkable. PANCREAS/PANCREATIC DUCT: Visualized pancreas is unremarkable. No pancreatic ductal dilatation. ADRENAL GLANDS: Unremarkable. KIDNEYS: Bilateral nonenhancing renal lesions of up to renal 2.8 cm are likely cysts and do not warrant imaging follow up. Mild left renal cortical thinning. LYMPH NODES: Right Cardiophrenic Angle adenopathy including at 1.0 cm on image 33 / 27. No enlarged abdominal lymph nodes. VASCULATURE: Advanced aortic atherosclerosis. Patent portal and splenic veins. PERITONEUM: Small volume perihepatic ascites. ABDOMINAL WALL: No hernia. No mass. BONES: Moderate convex right lumbar spine curvature. No acute abnormality or worrisome osseous lesion. SOFT TISSUES: Moderate right  hemidiaphragm elevation. Small bilateral pleural effusions. MISCELLANEOUS: Unremarkable. IMPRESSION: 1. 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 Metastatic disease from colon cancer could have a similar appearance but is felt less likely. 2.  intraductal 9 mm stone just upstream from the obstructive mass 3. tiny bilateral pleural effusions and small volume perihepatic ascites. 4.  right Cardiophrenic Angle adenopathy, again suspicious for nodal metastasis. 5.  mild motion degradation throughout. Electronically signed by: Rockey Kilts MD 06/06/2024 07:22 PM EDT RP Workstation: [id number]   MR 3D Recon At Scanner Result Date: 06/06/2024 EXAM: MRCP WITH IV CONTRAST 06/06/2024 06:52:40 PM TECHNIQUE: Multisequence, multiplanar magnetic resonance images of the abdomen with intravenous contrast. MRCP sequences were performed. 7 mL (gadobutrol (GADAVIST) 1 MMOL/ML injection 7 mL GADOBUTROL 1 MMOL/ML IV SOLN). COMPARISON: CT of earlier today. CLINICAL HISTORY: [id number] Liver mass [id number]. Table formatting  from the original note was not included.; Liver mass Liver mass. FINDINGS: LIMITATIONS/ARTIFACTS: Mild motion degradation throughout. LIVER: No cirrhosis. Segment 4a 3.8 x 5.1 cm ill-defined mass with delayed hyper-enhancement including on image 39 / 27. GALLBLADDER AND BILIARY SYSTEM: Cholecystectomy. Marked intrahepatic biliary duct dilatation including throughout the majority of the left hepatic lobe and minimally into segment 8, secondary to the segment 4a mass. Just upstream from the obstruction, an intraductal stone of 9 mm is identified included on image 19 / 4 and 9 / 3. No common duct dilatation. SPLEEN: Unremarkable. PANCREAS/PANCREATIC DUCT: Visualized pancreas is unremarkable. No pancreatic ductal dilatation. ADRENAL GLANDS: Unremarkable. KIDNEYS: Bilateral nonenhancing renal lesions of up to renal 2.8 cm are likely cysts and do not warrant imaging follow up. Mild left renal cortical thinning. LYMPH NODES: Right Cardiophrenic Angle adenopathy including at 1.0 cm on image 33 / 27. No enlarged abdominal lymph nodes. VASCULATURE: Advanced aortic atherosclerosis. Patent portal and splenic veins. PERITONEUM: Small volume perihepatic ascites. ABDOMINAL WALL: No hernia. No mass. BONES: Moderate convex right lumbar spine curvature. No acute abnormality or worrisome osseous lesion. SOFT TISSUES: Moderate right hemidiaphragm elevation. Small bilateral pleural effusions. MISCELLANEOUS: Unremarkable. IMPRESSION: 1. 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 Metastatic disease from colon cancer could have a similar appearance but is felt less likely. 2.  intraductal 9 mm stone just upstream from the obstructive mass 3. tiny bilateral pleural effusions and small volume perihepatic ascites. 4.  right Cardiophrenic Angle adenopathy, again suspicious for nodal metastasis. 5.  mild  motion degradation throughout. Electronically signed by: Rockey Kilts MD 06/06/2024 07:22 PM EDT RP Workstation: [id number]   CT ABDOMEN PELVIS W CONTRAST Result Date: 06/06/2024 CLINICAL DATA:  PE suspected, cough, vomiting * Tracking Code: BO * EXAM: CT ANGIOGRAPHY CHEST CT ABDOMEN AND PELVIS WITH CONTRAST TECHNIQUE: Multidetector CT imaging of the chest was performed using the standard protocol during bolus administration of intravenous contrast. Multiplanar CT image reconstructions and MIPs were obtained to evaluate the vascular anatomy. Multidetector CT imaging of the abdomen and pelvis was performed using the standard protocol during bolus administration of intravenous contrast. RADIATION DOSE REDUCTION: This exam was performed according to the departmental dose-optimization program which includes automated exposure control, adjustment of the mA and/or kV according to patient size and/or use of iterative reconstruction technique. CONTRAST:  75mL OMNIPAQUE IOHEXOL 350 MG/ML SOLN COMPARISON:  None Available. FINDINGS: CT CHEST ANGIOGRAM FINDINGS Cardiovascular: Satisfactory opacification of the pulmonary arteries to the  segmental level. No evidence of pulmonary embolism. Normal heart size. Three-vessel coronary artery calcifications. Enlargement of the main pulmonary artery measuring up to 3.6 cm in caliber. No pericardial effusion. Severe aortic atherosclerosis. Mediastinum/Nodes: Enlarged right cardiophrenic angle lymph nodes measuring up to 1.4 x 1.1 cm (series 2, image 16). No other enlarged mediastinal, hilar, or axillary lymph nodes. Thyroid gland, trachea, and esophagus demonstrate no significant findings. Lungs/Pleura: Diffuse bilateral bronchial wall thickening. Extensive clustered nodular airspace opacity throughout the lungs, particularly in the posterior upper lobes (series 6, image 45) although with several more discrete nodular appearing opacities throughout the lungs, for example in the  peripheral anterior right lower lobe measuring 1.1 x 0.9 cm (series 6, image 98). No pleural effusion or pneumothorax. Musculoskeletal: No chest wall abnormality. No acute osseous findings. Review of the MIP images confirms the above findings. CT ABDOMEN PELVIS FINDINGS Hepatobiliary: Large, rim enhancing mass in the left lobe of the liver, centered in hepatic segment IV, measuring 5.7 x 4.8 cm with associated segmental biliary ductal obstruction and atrophy of the left lobe (series 2, image 21). No gallstones, gallbladder wall thickening, or extrahepatic biliary dilatation. Pancreas: Unremarkable. No pancreatic ductal dilatation or surrounding inflammatory changes. Spleen: Normal in size without significant abnormality. Adrenals/Urinary Tract: Adrenal glands are unremarkable. Simple, benign bilateral renal cortical cysts. Kidneys are otherwise normal, without renal calculi, solid lesion, or hydronephrosis. Bladder is unremarkable. Stomach/Bowel: Stomach is within normal limits. Appendix appears normal. No evidence of bowel wall thickening, distention, or inflammatory changes. Descending and sigmoid diverticulosis. Vascular/Lymphatic: Severe aortic atherosclerosis. No enlarged abdominal or pelvic lymph nodes. Reproductive: No mass or other significant abnormality. Other: No abdominal wall hernia or abnormality. Small volume perihepatic ascites. Musculoskeletal: No acute or significant osseous findings. IMPRESSION: 1. Negative examination for pulmonary embolism. 2. Large, rim enhancing mass in the left lobe of the liver, centered in hepatic segment IV, measuring 5.7 x 4.8 cm with associated segmental biliary ductal obstruction and atrophy of the left lobe. 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 3. Enlarged right cardiophrenic angle lymph nodes, highly suspicious for nodal  metastatic disease. 4. Small volume perihepatic ascites. 5. Extensive clustered nodular airspace opacity throughout the lungs, particularly in the posterior upper lobes, with several more discrete nodular appearing opacities throughout the lungs. There is some component of atypical infection, however small pulmonary metastases amongst these airspace opacities very difficult to confidently exclude. 6. Diffuse bilateral bronchial wall thickening, consistent with nonspecific infectious or inflammatory bronchitis. 7. Enlargement of the main pulmonary artery, as can be seen in pulmonary hypertension. 8. Coronary artery disease. Aortic Atherosclerosis (ICD10-I70.0). Electronically Signed   By: Marolyn JONETTA Jaksch M.D.   On: 06/06/2024 10:17   CT Angio Chest PE W and/or Wo Contrast Result Date: 06/06/2024 CLINICAL DATA:  PE suspected, cough, vomiting * Tracking Code: BO * EXAM: CT ANGIOGRAPHY CHEST CT ABDOMEN AND PELVIS WITH CONTRAST TECHNIQUE: Multidetector CT imaging of the chest was performed using the standard protocol during bolus administration of intravenous contrast. Multiplanar CT image reconstructions and MIPs were obtained to evaluate the vascular anatomy. Multidetector CT imaging of the abdomen and pelvis was performed using the standard protocol during bolus administration of intravenous contrast. RADIATION DOSE REDUCTION: This exam was performed according to the departmental dose-optimization program which includes automated exposure control, adjustment of the mA and/or kV according to patient size and/or use of iterative reconstruction technique. CONTRAST:  75mL OMNIPAQUE IOHEXOL 350  MG/ML SOLN COMPARISON:  None Available. FINDINGS: CT CHEST ANGIOGRAM FINDINGS Cardiovascular: Satisfactory opacification of the pulmonary arteries to the segmental level. No evidence of pulmonary embolism. Normal heart size. Three-vessel coronary artery calcifications. Enlargement of the main pulmonary artery measuring up to 3.6 cm  in caliber. No pericardial effusion. Severe aortic atherosclerosis. Mediastinum/Nodes: Enlarged right cardiophrenic angle lymph nodes measuring up to 1.4 x 1.1 cm (series 2, image 16). No other enlarged mediastinal, hilar, or axillary lymph nodes. Thyroid gland, trachea, and esophagus demonstrate no significant findings. Lungs/Pleura: Diffuse bilateral bronchial wall thickening. Extensive clustered nodular airspace opacity throughout the lungs, particularly in the posterior upper lobes (series 6, image 45) although with several more discrete nodular appearing opacities throughout the lungs, for example in the peripheral anterior right lower lobe measuring 1.1 x 0.9 cm (series 6, image 98). No pleural effusion or pneumothorax. Musculoskeletal: No chest wall abnormality. No acute osseous findings. Review of the MIP images confirms the above findings. CT ABDOMEN PELVIS FINDINGS Hepatobiliary: Large, rim enhancing mass in the left lobe of the liver, centered in hepatic segment IV, measuring 5.7 x 4.8 cm with associated segmental biliary ductal obstruction and atrophy of the left lobe (series 2, image 21). No gallstones, gallbladder wall thickening, or extrahepatic biliary dilatation. Pancreas: Unremarkable. No pancreatic ductal dilatation or surrounding inflammatory changes. Spleen: Normal in size without significant abnormality. Adrenals/Urinary Tract: Adrenal glands are unremarkable. Simple, benign bilateral renal cortical cysts. Kidneys are otherwise normal, without renal calculi, solid lesion, or hydronephrosis. Bladder is unremarkable. Stomach/Bowel: Stomach is within normal limits. Appendix appears normal. No evidence of bowel wall thickening, distention, or inflammatory changes. Descending and sigmoid diverticulosis. Vascular/Lymphatic: Severe aortic atherosclerosis. No enlarged abdominal or pelvic lymph nodes. Reproductive: No mass or other significant abnormality. Other: No abdominal wall hernia or abnormality.  Small volume perihepatic ascites. Musculoskeletal: No acute or significant osseous findings. IMPRESSION: 1. Negative examination for pulmonary embolism. 2. Large, rim enhancing mass in the left lobe of the liver, centered in hepatic segment IV, measuring 5.7 x 4.8 cm with associated segmental biliary ductal obstruction and atrophy of the left lobe. 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 3. Enlarged right cardiophrenic angle lymph nodes, highly suspicious for nodal metastatic disease. 4. Small volume perihepatic ascites. 5. Extensive clustered nodular airspace opacity throughout the lungs, particularly in the posterior upper lobes, with several more discrete nodular appearing opacities throughout the lungs. There is some component of atypical infection, however small pulmonary metastases amongst these airspace opacities very difficult to confidently exclude. 6. Diffuse bilateral bronchial wall thickening, consistent with nonspecific infectious or inflammatory bronchitis. 7. Enlargement of the main pulmonary artery, as can be seen in pulmonary hypertension. 8. Coronary artery disease. Aortic Atherosclerosis (ICD10-I70.0). Electronically Signed   By: Marolyn JONETTA Jaksch M.D.   On: 06/06/2024 10:17    Labs:  CBC: Recent Labs    06/07/24 0451 06/08/24 0635 06/09/24 0019 06/10/24 0517  WBC 5.7 8.4 6.9 6.8  HGB 11.7* 12.1 11.4* 12.3  HCT 36.0 37.7 35.5* 38.1  PLT 222 308 232 266    COAGS: Recent Labs    06/06/24 0831 06/08/24 0635 06/08/24 1555 06/09/24 0019 06/09/24 0736 06/09/24 2015  INR 1.5*  --   --  1.3*  --   --   APTT  --    < > 49* 89* 61* 63*   < > = values in this interval not displayed.  BMP: Recent Labs    06/07/24 0451 06/08/24 0635 06/09/24 0019 06/10/24 0517  NA 135 133* 134* 136  K 3.5 3.6 3.4* 4.5  CL 96* 95* 98 95*  CO2 26 25  29 31   GLUCOSE 89 146* 118* 106*  BUN 10 15 12  7*  CALCIUM  8.6* 8.6* 8.6* 8.4*  CREATININE 0.63 0.84 0.76 0.80  GFRNONAA >60 >60 >60 >60    LIVER FUNCTION TESTS: Recent Labs    06/06/24 0834 06/07/24 0451 06/09/24 0019 06/10/24 0517  BILITOT 2.6* 1.5* 0.5 0.9  AST 23 18 22 17   ALT 9 7 6 10   ALKPHOS 89 76 79 61  PROT 7.8 6.2* 6.1* 6.1*  ALBUMIN 4.0 3.1* 2.9* 2.3*    TUMOR MARKERS: No results for input(s): AFPTM, CEA, CA199, CHROMGRNA in the last 8760 hours.  Assessment and Plan:  Gina T. Francesconi is a 83 y.o. female with pmhx of CAD, a fib, HTN endometrial cancer. admitted on 10.6. with abdominal pain. nausea and vomiting. MR abd from 10.6 shows new liver lesions.  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 Metastatic disease from colon cancer could have a similar appearance but is felt less likely. Per Palliative note dated 20.8.25 Patient Would consider cancer directed therapies depending on prognosis etc. Therefore appropriate for further workup. Pt has been transferred from [facility] to [facility]. Team is requesting liver lesion biopsy for further evaluation.   Today pt resting comfortably in bed without complaint. Has been NPO since midnight. Currently on heparin drip, will hold 2 hours prior.  Risks and benefits of liver biopsy was discussed with the patient and/or patient's family including, but not limited to bleeding, infection, damage to adjacent structures or low yield requiring additional tests.  All of the questions were answered and there is agreement to proceed.  Consent signed and in chart.   Thank you for allowing our service to participate in [place] [facility] 's care.  Electronically Signed: Kimble VEAR Clas, PA-C   06/10/2024, 8:29 AM      I spent a total of 20 Minutes    in face to face in  clinical consultation, greater than 50% of which was counseling/coordinating care for liver biopsy

## 2024-06-10 NOTE — Progress Notes (Signed)
 Heparin drip stopped at 0816, pt going to IR for liver biopsy.

## 2024-06-11 ENCOUNTER — Other Ambulatory Visit (HOSPITAL_COMMUNITY)

## 2024-06-11 DIAGNOSIS — I10 Essential (primary) hypertension: Secondary | ICD-10-CM | POA: Diagnosis not present

## 2024-06-11 DIAGNOSIS — I4891 Unspecified atrial fibrillation: Secondary | ICD-10-CM | POA: Diagnosis not present

## 2024-06-11 DIAGNOSIS — C541 Malignant neoplasm of endometrium: Secondary | ICD-10-CM | POA: Diagnosis not present

## 2024-06-11 DIAGNOSIS — A419 Sepsis, unspecified organism: Secondary | ICD-10-CM | POA: Diagnosis not present

## 2024-06-11 LAB — CULTURE, BLOOD (ROUTINE X 2)
Culture: NO GROWTH
Culture: NO GROWTH
Special Requests: ADEQUATE

## 2024-06-11 LAB — CBC
HCT: 38.8 % (ref 36.0–46.0)
Hemoglobin: 12.4 g/dL (ref 12.0–15.0)
MCH: 30.7 pg (ref 26.0–34.0)
MCHC: 32 g/dL (ref 30.0–36.0)
MCV: 96 fL (ref 80.0–100.0)
Platelets: 313 K/uL (ref 150–400)
RBC: 4.04 MIL/uL (ref 3.87–5.11)
RDW: 12.4 % (ref 11.5–15.5)
WBC: 8 K/uL (ref 4.0–10.5)
nRBC: 0 % (ref 0.0–0.2)

## 2024-06-11 LAB — BASIC METABOLIC PANEL WITH GFR
Anion gap: 10 (ref 5–15)
BUN: 11 mg/dL (ref 8–23)
CO2: 35 mmol/L — ABNORMAL HIGH (ref 22–32)
Calcium: 8.6 mg/dL — ABNORMAL LOW (ref 8.9–10.3)
Chloride: 92 mmol/L — ABNORMAL LOW (ref 98–111)
Creatinine, Ser: 0.85 mg/dL (ref 0.44–1.00)
GFR, Estimated: >60 mL/min (ref >60)
Glucose, Bld: 97 mg/dL (ref 70–99)
Potassium: 4 mmol/L (ref 3.5–5.1)
Sodium: 137 mmol/L (ref 135–145)

## 2024-06-11 LAB — HEPARIN LEVEL (UNFRACTIONATED): Heparin Unfractionated: 0.95 [IU]/mL — ABNORMAL HIGH (ref 0.30–0.70)

## 2024-06-11 LAB — APTT
aPTT: >200 s (ref 24–36)
aPTT: >200 s (ref 24–36)
aPTT: 69 s — ABNORMAL HIGH (ref 24–36)

## 2024-06-11 MED ORDER — HEPARIN (PORCINE) 25000 UT/250ML-% IV SOLN
1300.0000 [IU]/h | INTRAVENOUS | Status: DC
  Administered 2024-06-11 – 2024-06-12 (×2): 1250 [IU]/h via INTRAVENOUS
  Administered 2024-06-12: 1350 [IU]/h via INTRAVENOUS
  Filled 2024-06-11 (×2): qty 250

## 2024-06-11 MED ORDER — PANTOPRAZOLE SODIUM 40 MG PO TBEC
40.0000 mg | DELAYED_RELEASE_TABLET | Freq: Every day | ORAL | Status: DC
  Administered 2024-06-11 – 2024-06-13 (×3): 40 mg via ORAL
  Filled 2024-06-11 (×3): qty 1

## 2024-06-11 MED ORDER — FUROSEMIDE 20 MG PO TABS
20.0000 mg | ORAL_TABLET | Freq: Every day | ORAL | Status: DC
  Administered 2024-06-11 – 2024-06-12 (×2): 20 mg via ORAL
  Filled 2024-06-11 (×2): qty 1

## 2024-06-11 NOTE — Progress Notes (Signed)
 [facility]  PROGRESS NOTE  Gina [facility] FMW:[id number] DOB: 09/28/1940 DOA: 06/06/2024 PCP: [name], MD   Brief HPI:   83 y.o. female with a history of PAF on eliquis , CAD, GAD, HTN, HFrEF, endometrial CA who presented to the ED on 06/06/2024 with numerous complaints, including diffuse mild abdominal pain, has developed cough,  chest congestion and throwing up.  She also reports generalized malaise and fatigue which has been progressive and getting worse. She was tachycardic, tachypneic, hypertensive in the ED. CT abdomen and pelvis ruled out pulmonary embolism but shows large rim-enhancing mass in the left lobe of liver with associated segmental biliary duct obstruction and atrophy of left lobe finding consistent with malignancy.    She was admitted with GI and oncology consultations. Hospitalization complicated by AFib with RVR for which amiodarone was initiated. Hypotension required brief levophed infusion. Cardiology has been following and palliative care has also been consulted.    GI has recommended IR biopsy. IR has reviewed the case and images, recommends continuing heparin for anticoagulation and stopping 4 hours prior to procedure, will plan U/S guided biopsy, possibly with paracentesis as well.     Assessment/Plan:   Suspected sepsis due to pneumonia with mixed shock state: Hypotension related to AFib w/RVR, sepsis, hypovolemia, now resolved.  Patient presented with tachycardia, tachypnea, nausea, vomiting, elevated liver enzymes, met SIRS criteria.   CTA chest shows suspected atelectasis, ruled out PE. - Continue ceftriaxone  and azithromycin  - Follow up blood cultures - Weaned from pressor, improved after return to NSR.  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  - Needs tissue diagnosis. D/w Dr. Vanice who believes this is amenable to U/S-guided biopsy, will make NPO p MN. Scheduled for 10/10. Hold heparin for 4 hours prior to procedure. -Biopsy was postponed due to hypertension and oxygen requirement Biopsy scheduled for 06/13/2024 - Monitor LFTs, Follow up tumor markers. - GI following.    CAD with demand ischemia; - Hx LAD PCI 2017, had some chest pain with rapid rates that subsided, mildly elevated Tn and flat. No further intervention planned. Continue statin, anticoagulation.    Essential hypertension; - Was hypotensive requiring levophed - Blood pressure is now elevated, losartan  restarted -Continue metoprolol    PAF, A-fib with RVR, Hypotension: - Converted to NSR 10/8 AM, converted amiodarone gtt to po per cardiology. 200mg  po BID x3 weeks, then 200mg  once daily (watch LFTs)  - Continue heparin gtt in place of PTA eliquis , though can switch back to DOAC after biopsy.  - Echocardiogram showed EF 35%, grade 1 diastolic dysfunction - Pt of Dr. Denver. Note patient/family would prefer to be hospitalized at [facility].  -Continue metoprolol    Hyperlipidemia : - Continue rosuvastatin .   Goals of care discussion: - Extensive discussions with multiple disciplines and family members. Pt opts for full code, would like to pursue treatments depending on the outcome of the biopsy. Palliative has also engaged with patient/family.     Medications     amiodarone  200 mg Oral BID   Chlorhexidine Gluconate Cloth  6 each Topical Daily  docusate sodium  100 mg Oral BID   furosemide  20 mg Oral Daily   losartan   25 mg Oral Daily   metoprolol  tartrate  25 mg Oral BID   pantoprazole   40 mg Oral Daily    rosuvastatin   5 mg Oral Daily     Data Reviewed:   CBG:  No results for input(s): GLUCAP in the last 168 hours.  SpO2: 98 % O2 Flow Rate (L/min): 2 L/min    Vitals:   06/10/24 2010 06/10/24 2014 06/11/24 0535 06/11/24 0944  BP: 104/86 (!) 130/53 (!) 147/58 (!) 146/63  Pulse: 87 71 77 79  Resp: 18   16  Temp: 98.3 F (36.8 C)  97.6 F (36.4 C) 98 F (36.7 C)  TempSrc: Oral   Oral  SpO2: 97%  100% 98%  Weight:      Height:          Data Reviewed:  Basic Metabolic Panel: Recent Labs  Lab 06/07/24 0451 06/08/24 0635 06/09/24 0019 06/10/24 0517 06/11/24 0711  NA 135 133* 134* 136 137  K 3.5 3.6 3.4* 4.5 4.0  CL 96* 95* 98 95* 92*  CO2 26 25 29 31  35*  GLUCOSE 89 146* 118* 106* 97  BUN 10 15 12  7* 11  CREATININE 0.63 0.84 0.76 0.80 0.85  CALCIUM  8.6* 8.6* 8.6* 8.4* 8.6*  MG 1.4* 2.1  --   --   --   PHOS 2.9 3.0  --   --   --     CBC: Recent Labs  Lab 06/07/24 0451 06/08/24 0635 06/09/24 0019 06/10/24 0517 06/11/24 0711  WBC 5.7 8.4 6.9 6.8 8.0  HGB 11.7* 12.1 11.4* 12.3 12.4  HCT 36.0 37.7 35.5* 38.1 38.8  MCV 95.7 96.4 97.0 95.7 96.0  PLT 222 308 232 266 313    LFT Recent Labs  Lab 06/06/24 0755 06/06/24 0834 06/07/24 0451 06/09/24 0019 06/10/24 0517  AST 21 23 18 22 17   ALT 8 9 7 6 10   ALKPHOS 87 89 76 79 61  BILITOT 2.6* 2.6* 1.5* 0.5 0.9  PROT 7.7 7.8 6.2* 6.1* 6.1*  ALBUMIN 4.0 4.0 3.1* 2.9* 2.3*     Antibiotics: Anti-infectives (From admission, onward)    Start     Dose/Rate Route Frequency Ordered Stop   06/07/24 1000  cefTRIAXone  (ROCEPHIN ) 1 g in sodium chloride  0.9 % 100 mL IVPB        1 g 200 mL/hr over 30 Minutes Intravenous Every 24 hours 06/06/24 1226 06/11/24 1025   06/07/24 1000  azithromycin  (ZITHROMAX ) 500 mg in sodium chloride  0.9 % 250 mL IVPB        500 mg 250 mL/hr over 60 Minutes Intravenous Every 24 hours 06/06/24 1226 06/11/24 1155   06/06/24 1200  cefTRIAXone  (ROCEPHIN ) 2 g in sodium chloride  0.9 % 100  mL IVPB        2 g 200 mL/hr over 30 Minutes Intravenous Once 06/06/24 1155 06/06/24 1732   06/06/24 1200  azithromycin  (ZITHROMAX ) 500 mg in sodium chloride  0.9 % 250 mL IVPB        500 mg 250 mL/hr over 60 Minutes Intravenous  Once 06/06/24 1155 06/06/24 1732        DVT prophylaxis: Heparin  Code Status: Full code  Family Communication: Discussed with patient's husband at bedside   CONSULTS    Subjective   Blood pressure is improved.  Diuresed well with IV Lasix.   Objective    Physical Examination:  General-appears in no acute distress Heart-S1-S2, regular, no murmur auscultated Lungs-bibasilar crackles auscultated Abdomen-soft, nontender, no organomegaly Extremities-no edema in the lower extremities Neuro-alert, oriented x3, no focal deficit noted  Status is: Inpatient:             Sabas GORMAN Brod   [facility] If 7PM-7AM, please contact night-coverage at [email or web address], Office  [phone]   06/11/2024, 5:02 PM  LOS: 5 days

## 2024-06-11 NOTE — Progress Notes (Signed)
 Reported to MD of lab result called from Sherri in the lab for a PT>200.

## 2024-06-11 NOTE — Progress Notes (Signed)
 PHARMACY - ANTICOAGULATION CONSULT NOTE  Pharmacy Consult for heparin Indication: atrial fibrillation  No Known Allergies  Patient Measurements: Height: 5' 5 (165.1 cm) Weight: 64 kg (141 lb) IBW/kg (Calculated) : 57 HEPARIN DW (KG): 59  Vital Signs: Temp: 98 F (36.7 C) (10/11 0944) Temp Source: Oral (10/11 0944) BP: 146/63 (10/11 0944) Pulse Rate: 79 (10/11 0944)  Labs: Recent Labs    06/09/24 0019 06/09/24 0736 06/10/24 0517 06/10/24 0821 06/10/24 2007 06/11/24 0711 06/11/24 1039  HGB 11.4*  --  12.3  --   --  12.4  --   HCT 35.5*  --  38.1  --   --  38.8  --   PLT 232  --  266  --   --  313  --   APTT 89*   < >  --    < > 49* >200* >200*  LABPROT 16.7*  --   --   --   --   --   --   INR 1.3*  --   --   --   --   --   --   HEPARINUNFRC >1.10*  --  >1.10*  --  >1.10* 0.95*  --   CREATININE 0.76  --  0.80  --   --  0.85  --    < > = values in this interval not displayed.    Estimated Creatinine Clearance: 47.5 mL/min (by C-G formula based on SCr of 0.85 mg/dL).  Assessment: 83 year old female with history of afib (Chadsvasc = 6) on apixaban  prior to admit. Patient with possible need for liver biopsy this admit. Discussed with primary team will transition patient to heparin infusion tonight as apixaban  was given this morning( 10/7). No bleeding is noted.   Transferred to [facility] 10/9 for US -guided biopsy   PTT > 200 seconds x 2 this AM, drawn correctly  Goal of Therapy:  Heparin level 0.3-0.7 units/ml aPTT 66-102 seconds Monitor platelets by anticoagulation protocol: Yes   Plan:  Hold heparin x 1 hour  Decrease heparin to 1250 units / hr PTT in 8 hours Continue to monitor H&H and platelets Follow up plans for surgery/biopsy  Thank you. Olam Monte, PharmD  06/11/2024  12:24 PM

## 2024-06-11 NOTE — Progress Notes (Signed)
 PHARMACY - ANTICOAGULATION CONSULT NOTE  Pharmacy Consult for heparin Indication: atrial fibrillation  No Known Allergies  Patient Measurements: Height: 5' 5 (165.1 cm) Weight: 64 kg (141 lb) IBW/kg (Calculated) : 57 HEPARIN DW (KG): 59  Vital Signs: Temp: 97.8 F (36.6 C) (10/11 1743) Temp Source: Oral (10/11 1743) BP: 137/55 (10/11 2100) Pulse Rate: 75 (10/11 2100)  Labs: Recent Labs    06/09/24 0019 06/09/24 0736 06/10/24 0517 06/10/24 0821 06/10/24 2007 06/11/24 0711 06/11/24 1039 06/11/24 2051  HGB 11.4*  --  12.3  --   --  12.4  --   --   HCT 35.5*  --  38.1  --   --  38.8  --   --   PLT 232  --  266  --   --  313  --   --   APTT 89*   < >  --    < > 49* >200* >200* 69*  LABPROT 16.7*  --   --   --   --   --   --   --   INR 1.3*  --   --   --   --   --   --   --   HEPARINUNFRC >1.10*  --  >1.10*  --  >1.10* 0.95*  --   --   CREATININE 0.76  --  0.80  --   --  0.85  --   --    < > = values in this interval not displayed.    Estimated Creatinine Clearance: 47.5 mL/min (by C-G formula based on SCr of 0.85 mg/dL).  Assessment: 83 year old female with history of afib (Chadsvasc = 6) on apixaban  prior to admit. Patient with possible need for liver biopsy this admit. Discussed with primary team will transition patient to heparin infusion tonight as apixaban  was given this morning( 10/7). No bleeding is noted.   Transferred to [facility] 10/9 for US -guided biopsy   AM: PTT > 200 seconds x 2 this AM, drawn correctly  PM: PTT 69 after drip held for an hour and rate decrease to 1250 units/hr. No issues with the infusion or bleeding reported.  Goal of Therapy:  Heparin level 0.3-0.7 units/ml aPTT 66-102 seconds Monitor platelets by anticoagulation protocol: Yes   Plan:  Continue heparin at 1250 units / hr Check confirmatory PTT in 8 hours Continue to monitor H&H and platelets Follow up plans for surgery/biopsy  Thank you. Rocky Slade, PharmD, BCPS 06/11/2024   9:17 PM  Please check AMION for all [facility] phone numbers After 10:00 PM, call Main Pharmacy [phone]

## 2024-06-12 DIAGNOSIS — I10 Essential (primary) hypertension: Secondary | ICD-10-CM | POA: Diagnosis not present

## 2024-06-12 DIAGNOSIS — I4891 Unspecified atrial fibrillation: Secondary | ICD-10-CM | POA: Diagnosis not present

## 2024-06-12 DIAGNOSIS — A419 Sepsis, unspecified organism: Secondary | ICD-10-CM | POA: Diagnosis not present

## 2024-06-12 DIAGNOSIS — C541 Malignant neoplasm of endometrium: Secondary | ICD-10-CM | POA: Diagnosis not present

## 2024-06-12 LAB — CBC
HCT: 38.4 % (ref 36.0–46.0)
Hemoglobin: 12.2 g/dL (ref 12.0–15.0)
MCH: 30.7 pg (ref 26.0–34.0)
MCHC: 31.8 g/dL (ref 30.0–36.0)
MCV: 96.7 fL (ref 80.0–100.0)
Platelets: 321 K/uL (ref 150–400)
RBC: 3.97 MIL/uL (ref 3.87–5.11)
RDW: 12.5 % (ref 11.5–15.5)
WBC: 7.9 K/uL (ref 4.0–10.5)
nRBC: 0 % (ref 0.0–0.2)

## 2024-06-12 LAB — BASIC METABOLIC PANEL WITH GFR
Anion gap: 18 — ABNORMAL HIGH (ref 5–15)
BUN: 13 mg/dL (ref 8–23)
CO2: 34 mmol/L — ABNORMAL HIGH (ref 22–32)
Calcium: 8.5 mg/dL — ABNORMAL LOW (ref 8.9–10.3)
Chloride: 90 mmol/L — ABNORMAL LOW (ref 98–111)
Creatinine, Ser: 0.86 mg/dL (ref 0.44–1.00)
GFR, Estimated: >60 mL/min (ref >60)
Glucose, Bld: 137 mg/dL — ABNORMAL HIGH (ref 70–99)
Potassium: 3.5 mmol/L (ref 3.5–5.1)
Sodium: 142 mmol/L (ref 135–145)

## 2024-06-12 LAB — APTT
aPTT: 43 s — ABNORMAL HIGH (ref 24–36)
aPTT: 77 s — ABNORMAL HIGH (ref 24–36)

## 2024-06-12 LAB — HEPARIN LEVEL (UNFRACTIONATED): Heparin Unfractionated: 0.32 [IU]/mL (ref 0.30–0.70)

## 2024-06-12 MED ORDER — FUROSEMIDE 40 MG PO TABS
40.0000 mg | ORAL_TABLET | Freq: Every day | ORAL | Status: DC
  Administered 2024-06-12 – 2024-06-13 (×2): 40 mg via ORAL
  Filled 2024-06-12 (×2): qty 1

## 2024-06-12 NOTE — Plan of Care (Signed)
  Problem: Education: Goal: Knowledge of General Education information will improve Description: Including pain rating scale, medication(s)/side effects and non-pharmacologic comfort measures Outcome: Progressing   Problem: Clinical Measurements: Goal: Ability to maintain clinical measurements within normal limits will improve Outcome: Progressing Goal: Respiratory complications will improve Outcome: Progressing   

## 2024-06-12 NOTE — Progress Notes (Signed)
 PHARMACY - ANTICOAGULATION CONSULT NOTE  Pharmacy Consult for heparin Indication: atrial fibrillation  No Known Allergies  Patient Measurements: Height: 5' 5 (165.1 cm) Weight: 64 kg (141 lb) IBW/kg (Calculated) : 57 HEPARIN DW (KG): 59  Vital Signs: Temp: 98 F (36.7 C) (10/12 0849) BP: 138/54 (10/12 0849) Pulse Rate: 80 (10/12 0849)  Labs: Recent Labs    06/10/24 0517 06/10/24 0821 06/10/24 2007 06/11/24 0711 06/11/24 1039 06/11/24 2051 06/12/24 0534 06/12/24 0901 06/12/24 1457  HGB 12.3  --   --  12.4  --   --   --  12.2  --   HCT 38.1  --   --  38.8  --   --   --  38.4  --   PLT 266  --   --  313  --   --   --  321  --   APTT  --    < > 49* >200*   < > 69* 43*  --  77*  HEPARINUNFRC >1.10*  --  >1.10* 0.95*  --   --  0.32  --   --   CREATININE 0.80  --   --  0.85  --   --   --  0.86  --    < > = values in this interval not displayed.    Estimated Creatinine Clearance: 46.9 mL/min (by C-G formula based on SCr of 0.86 mg/dL).  Assessment: 83 year old female with history of afib (Chadsvasc = 6) on apixaban  prior to admit. Patient with possible need for liver biopsy this admit. Discussed with primary team will transition patient to heparin infusion tonight as apixaban  was given this morning( 10/7). No bleeding is noted. Transferred to [facility] 10/9 for US -guided biopsy   AM: aPTT 43 seconds after therapeutic x1 on 1250 units/hr. No issues with the infusion or bleeding per RN. Last CBC stable // previously supra-therapeutic on 1400 units/hr  PM: aPTT 77 is therapeutic on heparin 1350 units/hr. No issues with the infusion or bleeding reported.  Goal of Therapy:  Heparin level 0.3-0.7 units/ml aPTT 66-102 seconds Monitor platelets by anticoagulation protocol: Yes   Plan:  Continue heparin at 1350 units / hr Check confirmatory aPTT in 8 hours Continue to monitor H&H and platelets Follow up plans for surgery/biopsy  Rocky Slade, PharmD, BCPS Clinical  Pharmacist 06/12/2024 4:03 PM

## 2024-06-12 NOTE — Plan of Care (Signed)
   Problem: Clinical Measurements: Goal: Ability to maintain clinical measurements within normal limits will improve Outcome: Progressing

## 2024-06-12 NOTE — Progress Notes (Signed)
 [facility]  PROGRESS NOTE  Gina [facility] FMW:[id number] DOB: 11/30/1940 DOA: 06/06/2024 PCP: [name], MD   Brief HPI:   83 y.o. female with a history of PAF on eliquis , CAD, GAD, HTN, HFrEF, endometrial CA who presented to the ED on 06/06/2024 with numerous complaints, including diffuse mild abdominal pain, has developed cough,  chest congestion and throwing up.  She also reports generalized malaise and fatigue which has been progressive and getting worse. She was tachycardic, tachypneic, hypertensive in the ED. CT abdomen and pelvis ruled out pulmonary embolism but shows large rim-enhancing mass in the left lobe of liver with associated segmental biliary duct obstruction and atrophy of left lobe finding consistent with malignancy.    She was admitted with GI and oncology consultations. Hospitalization complicated by AFib with RVR for which amiodarone was initiated. Hypotension required brief levophed infusion. Cardiology has been following and palliative care has also been consulted.    GI has recommended IR biopsy. IR has reviewed the case and images, recommends continuing heparin for anticoagulation and stopping 4 hours prior to procedure, will plan U/S guided biopsy, possibly with paracentesis as well.     Assessment/Plan:   Suspected sepsis due to pneumonia with mixed shock state: Hypotension related to AFib w/RVR, sepsis, hypovolemia, now resolved.  Patient presented with tachycardia, tachypnea, nausea, vomiting, elevated liver enzymes, met SIRS criteria.   CTA chest shows suspected atelectasis, ruled out PE. - Completed 5 days of antibiotics, ceftriaxone  and azithromycin  - Blood cultures x 2 showed no growth - Weaned from pressor, improved after return to NSR.  - OOB, IS recommended   Acute hypoxemic respiratory failure -Requiring oxygen, currently not on oxygen at home -Obtained chest x-ray which shows pulmonary edema -BNP elevated, 815.8 -Diuresed well with  Lasix 20 mg IV -Will monitor, wean off oxygen as tolerated -Started on low-dose Lasix 20 mg daily, will increase Lasix to 40 mg daily. 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  - Needs tissue diagnosis. D/w Dr. Vanice who believes this is amenable to U/S-guided biopsy,  -Biopsy was postponed due to hypertension and oxygen requirement Biopsy scheduled for 06/13/2024 - Monitor LFTs, Follow up tumor markers. - GI following.  -Will make n.p.o. after midnight   CAD with demand ischemia; - Hx LAD PCI 2017, had some chest pain with rapid rates that subsided, mildly elevated Tn and flat. No further intervention planned. Continue statin, anticoagulation.    Essential hypertension; - Was hypotensive requiring levophed - Blood pressure is now elevated, losartan  restarted -Continue metoprolol    PAF, A-fib with RVR, Hypotension: - Converted to NSR 10/8 AM, converted amiodarone gtt to po per cardiology. 200mg  po BID x3 weeks, then 200mg  once daily (watch LFTs)  - Continue heparin gtt in place of PTA eliquis , though can switch back to DOAC after biopsy.  - Echocardiogram showed EF 35%, grade 1 diastolic dysfunction - Pt of Dr. Denver. Note patient/family would prefer to be hospitalized at [facility].  -Continue metoprolol    Hyperlipidemia : - Continue rosuvastatin .   Goals of care discussion: - Extensive discussions with multiple disciplines and family members. Pt opts for full code, would like to pursue treatments depending on the outcome of the biopsy. Palliative has also engaged with patient/family.     Medications     amiodarone  200  mg Oral BID   Chlorhexidine Gluconate Cloth  6 each Topical Daily   docusate sodium  100 mg Oral BID   furosemide  40 mg Oral Daily    losartan   25 mg Oral Daily   metoprolol  tartrate  25 mg Oral BID   pantoprazole   40 mg Oral Daily   rosuvastatin   5 mg Oral Daily     Data Reviewed:   CBG:  No results for input(s): GLUCAP in the last 168 hours.  SpO2: 95 % O2 Flow Rate (L/min): 2 L/min    Vitals:   06/11/24 1800 06/11/24 2100 06/12/24 0500 06/12/24 0849  BP:  (!) 137/55 (!) 158/65 (!) 138/54  Pulse:  75 73 80  Resp:  16 18 18   Temp:   98 F (36.7 C) 98 F (36.7 C)  TempSrc:      SpO2: 93% 97% 98% 95%  Weight:      Height:          Data Reviewed:  Basic Metabolic Panel: Recent Labs  Lab 06/07/24 0451 06/08/24 0635 06/09/24 0019 06/10/24 0517 06/11/24 0711 06/12/24 0901  NA 135 133* 134* 136 137 142  K 3.5 3.6 3.4* 4.5 4.0 3.5  CL 96* 95* 98 95* 92* 90*  CO2 26 25 29 31  35* 34*  GLUCOSE 89 146* 118* 106* 97 137*  BUN 10 15 12  7* 11 13  CREATININE 0.63 0.84 0.76 0.80 0.85 0.86  CALCIUM  8.6* 8.6* 8.6* 8.4* 8.6* 8.5*  MG 1.4* 2.1  --   --   --   --   PHOS 2.9 3.0  --   --   --   --     CBC: Recent Labs  Lab 06/08/24 0635 06/09/24 0019 06/10/24 0517 06/11/24 0711 06/12/24 0901  WBC 8.4 6.9 6.8 8.0 7.9  HGB 12.1 11.4* 12.3 12.4 12.2  HCT 37.7 35.5* 38.1 38.8 38.4  MCV 96.4 97.0 95.7 96.0 96.7  PLT 308 232 266 313 321    LFT Recent Labs  Lab 06/06/24 0755 06/06/24 0834 06/07/24 0451 06/09/24 0019 06/10/24 0517  AST 21 23 18 22 17   ALT 8 9 7 6 10   ALKPHOS 87 89 76 79 61  BILITOT 2.6* 2.6* 1.5* 0.5 0.9  PROT 7.7 7.8 6.2* 6.1* 6.1*  ALBUMIN 4.0 4.0 3.1* 2.9* 2.3*     Antibiotics: Anti-infectives (From admission, onward)    Start     Dose/Rate Route Frequency Ordered Stop   06/07/24 1000  cefTRIAXone  (ROCEPHIN ) 1 g in sodium chloride  0.9 % 100 mL IVPB        1 g 200 mL/hr over 30 Minutes Intravenous Every 24 hours 06/06/24 1226 06/11/24 1025   06/07/24 1000  azithromycin  (ZITHROMAX ) 500 mg in sodium chloride  0.9 % 250 mL IVPB        500 mg 250 mL/hr over 60 Minutes  Intravenous Every 24 hours 06/06/24 1226 06/11/24 1155   06/06/24 1200  cefTRIAXone  (ROCEPHIN ) 2 g in sodium chloride  0.9 % 100 mL IVPB        2 g 200 mL/hr over 30 Minutes Intravenous Once 06/06/24 1155 06/06/24 1732   06/06/24 1200  azithromycin  (ZITHROMAX ) 500 mg in sodium chloride  0.9 % 250 mL IVPB        500 mg 250 mL/hr over 60 Minutes Intravenous  Once 06/06/24 1155 06/06/24 1732        DVT prophylaxis: Heparin  Code Status: Full code  Family Communication: Discussed with patient's husband at  bedside   CONSULTS    Subjective   Denies any complaints.   Objective    Physical Examination:  Appears in no acute distress S1-S2, regular Lungs bibasilar crackles auscultated Abdomen is soft, nontender, no organomegaly Extremities no edema   Status is: Inpatient:           Gina [facility]   [facility] If 7PM-7AM, please contact night-coverage at [email or web address], Office  [phone]   06/12/2024, 12:53 PM  LOS: 6 days

## 2024-06-12 NOTE — Progress Notes (Signed)
 PHARMACY - ANTICOAGULATION CONSULT NOTE  Pharmacy Consult for heparin Indication: atrial fibrillation  No Known Allergies  Patient Measurements: Height: 5' 5 (165.1 cm) Weight: 64 kg (141 lb) IBW/kg (Calculated) : 57 HEPARIN DW (KG): 59  Vital Signs: Temp: 98 F (36.7 C) (10/12 0500) BP: 158/65 (10/12 0500) Pulse Rate: 73 (10/12 0500)  Labs: Recent Labs    06/10/24 0517 06/10/24 0821 06/10/24 2007 06/11/24 0711 06/11/24 1039 06/11/24 2051 06/12/24 0534  HGB 12.3  --   --  12.4  --   --   --   HCT 38.1  --   --  38.8  --   --   --   PLT 266  --   --  313  --   --   --   APTT  --    < > 49* >200* >200* 69* 43*  HEPARINUNFRC >1.10*  --  >1.10* 0.95*  --   --   --   CREATININE 0.80  --   --  0.85  --   --   --    < > = values in this interval not displayed.    Estimated Creatinine Clearance: 47.5 mL/min (by C-G formula based on SCr of 0.85 mg/dL).  Assessment: 83 year old female with history of afib (Chadsvasc = 6) on apixaban  prior to admit. Patient with possible need for liver biopsy this admit. Discussed with primary team will transition patient to heparin infusion tonight as apixaban  was given this morning( 10/7). No bleeding is noted. Transferred to [facility] 10/9 for US -guided biopsy   AM: aPTT 43 seconds after therapeutic x1 on 1250 units/hr. No issues with the infusion or bleeding per RN. Last CBC stable // previously supra-therapeutic on 1400 units/hr  Goal of Therapy:  Heparin level 0.3-0.7 units/ml aPTT 66-102 seconds Monitor platelets by anticoagulation protocol: Yes   Plan:  Increase heparin at 1350 units / hr Check aPTT in 8 hours Continue to monitor H&H and platelets Follow up plans for surgery/biopsy  Lynwood Poplar, PharmD, BCPS Clinical Pharmacist 06/12/2024 6:43 AM

## 2024-06-12 NOTE — Progress Notes (Signed)
 Mobility Specialist Progress Note:    06/12/24 1045  Mobility  Activity Ambulated with assistance  Level of Assistance Contact guard assist, steadying assist  Assistive Device Other (Comment) (HHA)  Distance Ambulated (ft) 5 ft  Activity Response Tolerated well  Mobility Referral Yes  Mobility visit 1 Mobility  Mobility Specialist Start Time (ACUTE ONLY) 1030  Mobility Specialist Stop Time (ACUTE ONLY) 1045  Mobility Specialist Time Calculation (min) (ACUTE ONLY) 15 min   Pt received in bed agreeable to mobility. No physical assistance needed throughout just contact guard and HHA for stability. No c/o throughout. Took a few steps towards the chair w/o fault. Left in chair w/ call bell and personal belongings in reach. All needs met.  Thersia Minder Mobility Specialist  Please contact vis Secure Chat or  Rehab Office [phone]

## 2024-06-13 ENCOUNTER — Inpatient Hospital Stay (HOSPITAL_COMMUNITY)

## 2024-06-13 DIAGNOSIS — A419 Sepsis, unspecified organism: Secondary | ICD-10-CM | POA: Diagnosis not present

## 2024-06-13 DIAGNOSIS — J40 Bronchitis, not specified as acute or chronic: Secondary | ICD-10-CM

## 2024-06-13 DIAGNOSIS — R6510 Systemic inflammatory response syndrome (SIRS) of non-infectious origin without acute organ dysfunction: Secondary | ICD-10-CM | POA: Diagnosis not present

## 2024-06-13 DIAGNOSIS — I4891 Unspecified atrial fibrillation: Secondary | ICD-10-CM | POA: Diagnosis not present

## 2024-06-13 LAB — BASIC METABOLIC PANEL WITH GFR
Anion gap: 15 (ref 5–15)
BUN: 16 mg/dL (ref 8–23)
CO2: 36 mmol/L — ABNORMAL HIGH (ref 22–32)
Calcium: 8.6 mg/dL — ABNORMAL LOW (ref 8.9–10.3)
Chloride: 87 mmol/L — ABNORMAL LOW (ref 98–111)
Creatinine, Ser: 0.85 mg/dL (ref 0.44–1.00)
GFR, Estimated: >60 mL/min (ref >60)
Glucose, Bld: 104 mg/dL — ABNORMAL HIGH (ref 70–99)
Potassium: 3.7 mmol/L (ref 3.5–5.1)
Sodium: 138 mmol/L (ref 135–145)

## 2024-06-13 LAB — PROTIME-INR
INR: 1.1 (ref 0.8–1.2)
Prothrombin Time: 14.6 s (ref 11.4–15.2)

## 2024-06-13 LAB — APTT: aPTT: 100 s — ABNORMAL HIGH (ref 24–36)

## 2024-06-13 MED ORDER — MIDAZOLAM HCL 2 MG/2ML IJ SOLN
INTRAMUSCULAR | Status: AC
  Filled 2024-06-13: qty 2

## 2024-06-13 MED ORDER — LIDOCAINE HCL (PF) 1 % IJ SOLN
10.0000 mL | Freq: Once | INTRAMUSCULAR | Status: AC
  Administered 2024-06-13: 10 mL

## 2024-06-13 MED ORDER — GELATIN ABSORBABLE 12-7 MM EX MISC
1.0000 | Freq: Once | CUTANEOUS | Status: AC
  Administered 2024-06-13: 1 via TOPICAL

## 2024-06-13 MED ORDER — FENTANYL CITRATE (PF) 100 MCG/2ML IJ SOLN
INTRAMUSCULAR | Status: AC
  Filled 2024-06-13: qty 2

## 2024-06-13 MED ORDER — APIXABAN 5 MG PO TABS: 5.0000 mg | ORAL_TABLET | Freq: Two times a day (BID) | ORAL | Status: DC

## 2024-06-13 MED ORDER — MIDAZOLAM HCL 2 MG/2ML IJ SOLN
INTRAMUSCULAR | Status: AC | PRN
  Administered 2024-06-13: 1 mg via INTRAVENOUS

## 2024-06-13 MED ORDER — ALUM & MAG HYDROXIDE-SIMETH 200-200-20 MG/5ML PO SUSP
15.0000 mL | ORAL | Status: DC | PRN
  Administered 2024-06-13: 15 mL via ORAL
  Filled 2024-06-13: qty 30

## 2024-06-13 MED ORDER — AMIODARONE HCL 200 MG PO TABS: 200.0000 mg | ORAL_TABLET | Freq: Every day | ORAL | 1 refills | Status: DC

## 2024-06-13 MED ORDER — LOSARTAN POTASSIUM 25 MG PO TABS: 25.0000 mg | ORAL_TABLET | Freq: Every day | ORAL | 2 refills | Status: DC

## 2024-06-13 MED ORDER — FENTANYL CITRATE (PF) 100 MCG/2ML IJ SOLN
INTRAMUSCULAR | Status: AC | PRN
  Administered 2024-06-13: 25 ug via INTRAVENOUS

## 2024-06-13 NOTE — Progress Notes (Addendum)
Pt back on unit from IR.

## 2024-06-13 NOTE — Progress Notes (Signed)
 SATURATION QUALIFICATIONS: (This note is used to comply with regulatory documentation for home oxygen)  Patient Saturations on Room Air at Rest = 87%-93%  Patient Saturations on Room Air while Ambulating = 90%  Patient Saturations on 1 Liters of oxygen while Ambulating = 94%

## 2024-06-13 NOTE — Progress Notes (Signed)
 PHARMACY - ANTICOAGULATION CONSULT NOTE  Pharmacy Consult for heparin Indication: atrial fibrillation  No Known Allergies  Patient Measurements: Height: 5' 5 (165.1 cm) Weight: 64 kg (141 lb) IBW/kg (Calculated) : 57 HEPARIN DW (KG): 59  Vital Signs: Temp: 98.9 F (37.2 C) (10/12 2337) Temp Source: Oral (10/12 1755) BP: 129/64 (10/12 2337) Pulse Rate: 67 (10/12 2337)  Labs: Recent Labs    06/10/24 0517 06/10/24 0821 06/10/24 2007 06/11/24 0711 06/11/24 1039 06/12/24 0534 06/12/24 0901 06/12/24 1457 06/12/24 2339  HGB 12.3  --   --  12.4  --   --  12.2  --   --   HCT 38.1  --   --  38.8  --   --  38.4  --   --   PLT 266  --   --  313  --   --  321  --   --   APTT  --    < > 49* >200*   < > 43*  --  77* 100*  HEPARINUNFRC >1.10*  --  >1.10* 0.95*  --  0.32  --   --   --   CREATININE 0.80  --   --  0.85  --   --  0.86  --   --    < > = values in this interval not displayed.    Estimated Creatinine Clearance: 46.9 mL/min (by C-G formula based on SCr of 0.86 mg/dL).  Assessment: 83 year old female with history of afib (Chadsvasc = 6) on apixaban  prior to admit. Patient with possible need for liver biopsy this admit. Discussed with primary team will transition patient to heparin infusion tonight as apixaban  was given this morning( 10/7). No bleeding is noted. Transferred to [facility] 10/9 for US -guided biopsy   AM: aPTT 43 seconds after therapeutic x1 on 1250 units/hr. No issues with the infusion or bleeding per RN. Last CBC stable // previously supra-therapeutic on 1400 units/hr  AM: aPTT is therapeutic x2 on heparin 1350 units/hr. No issues with the infusion or bleeding per RN. aPTT at upper limit but still within goal.  Goal of Therapy:  Heparin level 0.3-0.7 units/ml aPTT 66-102 seconds Monitor platelets by anticoagulation protocol: Yes   Plan:  Decrease heparin at 1300 units / hr to keep within goal range Check aPTT daily Monitor with aPTTs until anti-Xa level  correlate  Continue to monitor H&H and platelets Heparin gtt to stop at 0400 for biopsy on 10/13  Lynwood Poplar, PharmD, BCPS Clinical Pharmacist 06/13/2024 12:19 AM

## 2024-06-13 NOTE — Progress Notes (Addendum)
 Patient leaving to IR.  HR 135-145, PA Matthews down at IR notified.  Metrop given.

## 2024-06-13 NOTE — Care Management Important Message (Signed)
 Important Message  Patient Details  Name: Gina Duncan MRN: [id number] Date of Birth: Oct 11, 1940   Important Message Given:  Yes - Medicare IM     Jon Cruel 06/13/2024, 4:52 PM

## 2024-06-13 NOTE — Progress Notes (Incomplete)
 [facility]  PROGRESS NOTE  Gina Duncan FMW:[id number] DOB: 05/19/1941 DOA: 06/06/2024 PCP: [name], MD   Brief HPI:   83 y.o. female with a history of PAF on eliquis , CAD, GAD, HTN, HFrEF, endometrial CA who presented to the ED on 06/06/2024 with numerous complaints, including diffuse mild abdominal pain, has developed cough,  chest congestion and throwing up.  She also reports generalized malaise and fatigue which has been progressive and getting worse. She was tachycardic, tachypneic, hypertensive in the ED. CT abdomen and pelvis ruled out pulmonary embolism but shows large rim-enhancing mass in the left lobe of liver with associated segmental biliary duct obstruction and atrophy of left lobe finding consistent with malignancy.    She was admitted with GI and oncology consultations. Hospitalization complicated by AFib with RVR for which amiodarone was initiated. Hypotension required brief levophed infusion. Cardiology has been following and palliative care has also been consulted.    GI has recommended IR biopsy. IR has reviewed the case and images, recommends continuing heparin for anticoagulation and stopping 4 hours prior to procedure, will plan U/S guided biopsy, possibly with paracentesis as well.     Assessment/Plan:   Suspected sepsis due to pneumonia with mixed shock state: Hypotension related to AFib w/RVR, sepsis, hypovolemia, now resolved.  Patient presented with tachycardia, tachypnea, nausea, vomiting, elevated liver enzymes, met SIRS criteria.   CTA chest shows suspected atelectasis, ruled out PE. - Completed 5 days of antibiotics, ceftriaxone  and azithromycin  - Blood cultures x 2 showed no growth - Weaned from pressor, improved after return to NSR.  - OOB, IS recommended   Acute hypoxemic respiratory failure -Requiring oxygen, currently not on oxygen at home -Obtained chest x-ray which shows pulmonary edema -BNP elevated, 815.8 -Diuresed well with  Lasix 20 mg IV -Will monitor, wean off oxygen as tolerated -Started on low-dose Lasix 20 mg daily, will increase Lasix to 40 mg daily. 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  - Needs tissue diagnosis. D/w Dr. Vanice who believes this is amenable to U/S-guided biopsy,  -Biopsy was postponed due to hypertension and oxygen requirement Biopsy scheduled for 06/13/2024 - Monitor LFTs, Follow up tumor markers. - GI following.  -Will make n.p.o. after midnight   CAD with demand ischemia; - Hx LAD PCI 2017, had some chest pain with rapid rates that subsided, mildly elevated Tn and flat. No further intervention planned. Continue statin, anticoagulation.    Essential hypertension; - Was hypotensive requiring levophed - Blood pressure is now elevated, losartan  restarted -Continue metoprolol    PAF, A-fib with RVR, Hypotension: - Converted to NSR 10/8 AM, converted amiodarone gtt to po per cardiology. 200mg  po BID x3 weeks, then 200mg  once daily (watch LFTs)  - Continue heparin gtt in place of PTA eliquis , though can switch back to DOAC after biopsy.  - Echocardiogram showed EF 35%, grade 1 diastolic dysfunction - Pt of Dr. Denver. Note patient/family would prefer to be hospitalized at [facility].  -Continue metoprolol    Hyperlipidemia : - Continue rosuvastatin .   Goals of care discussion: - Extensive discussions with multiple disciplines and family members. Pt opts for full code, would like to pursue treatments depending on the outcome of the biopsy. Palliative has also engaged with patient/family.     Medications     amiodarone  200  mg Oral BID   Chlorhexidine Gluconate Cloth  6 each Topical Daily   docusate sodium  100 mg Oral BID   furosemide  40 mg Oral Daily    losartan   25 mg Oral Daily   metoprolol  tartrate  25 mg Oral BID   pantoprazole   40 mg Oral Daily   rosuvastatin   5 mg Oral Daily     Data Reviewed:   CBG:  No results for input(s): GLUCAP in the last 168 hours.  SpO2: 99 % O2 Flow Rate (L/min): 2 L/min    Vitals:   06/12/24 1755 06/12/24 2059 06/12/24 2337 06/13/24 0744  BP: (!) 156/66 (!) 159/96 129/64 (!) 183/65  Pulse: 71 (!) 112 67 73  Resp: 15 18 18 18   Temp: 97.9 F (36.6 C) 97.8 F (36.6 C) 98.9 F (37.2 C) 98.1 F (36.7 C)  TempSrc: Oral   Oral  SpO2: 96% 96% 99% 99%  Weight:      Height:          Data Reviewed:  Basic Metabolic Panel: Recent Labs  Lab 06/07/24 0451 06/08/24 0635 06/09/24 0019 06/10/24 0517 06/11/24 0711 06/12/24 0901 06/13/24 0649  NA 135 133* 134* 136 137 142 138  K 3.5 3.6 3.4* 4.5 4.0 3.5 3.7  CL 96* 95* 98 95* 92* 90* 87*  CO2 26 25 29 31  35* 34* 36*  GLUCOSE 89 146* 118* 106* 97 137* 104*  BUN 10 15 12  7* 11 13 16   CREATININE 0.63 0.84 0.76 0.80 0.85 0.86 0.85  CALCIUM  8.6* 8.6* 8.6* 8.4* 8.6* 8.5* 8.6*  MG 1.4* 2.1  --   --   --   --   --   PHOS 2.9 3.0  --   --   --   --   --     CBC: Recent Labs  Lab 06/08/24 0635 06/09/24 0019 06/10/24 0517 06/11/24 0711 06/12/24 0901  WBC 8.4 6.9 6.8 8.0 7.9  HGB 12.1 11.4* 12.3 12.4 12.2  HCT 37.7 35.5* 38.1 38.8 38.4  MCV 96.4 97.0 95.7 96.0 96.7  PLT 308 232 266 313 321    LFT Recent Labs  Lab 06/06/24 0834 06/07/24 0451 06/09/24 0019 06/10/24 0517  AST 23 18 22 17   ALT 9 7 6 10   ALKPHOS 89 76 79 61  BILITOT 2.6* 1.5* 0.5 0.9  PROT 7.8 6.2* 6.1* 6.1*  ALBUMIN 4.0 3.1* 2.9* 2.3*     Antibiotics: Anti-infectives (From admission, onward)    Start     Dose/Rate Route Frequency Ordered Stop   06/07/24 1000  cefTRIAXone  (ROCEPHIN ) 1 g in sodium chloride  0.9 % 100 mL IVPB        1 g 200 mL/hr over 30 Minutes Intravenous Every 24 hours 06/06/24 1226 06/11/24 1025   06/07/24 1000  azithromycin  (ZITHROMAX )  500 mg in sodium chloride  0.9 % 250 mL IVPB        500 mg 250 mL/hr over 60 Minutes Intravenous Every 24 hours 06/06/24 1226 06/11/24 1155   06/06/24 1200  cefTRIAXone  (ROCEPHIN ) 2 g in sodium chloride  0.9 % 100 mL IVPB        2 g 200 mL/hr over 30 Minutes Intravenous Once 06/06/24 1155 06/06/24 1732   06/06/24 1200  azithromycin  (ZITHROMAX ) 500 mg in sodium chloride  0.9 % 250 mL IVPB        500 mg 250 mL/hr over 60 Minutes Intravenous  Once 06/06/24 1155 06/06/24 1732        DVT  prophylaxis: Heparin  Code Status: Full code  Family Communication: Discussed with patient's husband at bedside   CONSULTS    Subjective      Objective    Physical Examination:    Status is: Inpatient:           Sabas GORMAN Brod   [facility] If 7PM-7AM, please contact night-coverage at [email or web address], Office  [phone]   06/13/2024, 8:10 AM  LOS: 7 days

## 2024-06-13 NOTE — Progress Notes (Signed)
 Prn hydralazine  5mg  given for BP 183/65

## 2024-06-13 NOTE — Progress Notes (Signed)
 OK by Wilkie MARLA Lent MD  to restarted heart health diet , orders placed

## 2024-06-13 NOTE — Plan of Care (Signed)

## 2024-06-13 NOTE — Discharge Summary (Signed)
 Physician Discharge Summary   Patient: Gina Duncan MRN: [id number] DOB: 17-Jan-1941  Admit date:     06/06/2024  Discharge date: 06/13/24  Discharge Physician: Sabas GORMAN Brod   PCP: [name], MD   Recommendations at discharge:   Follow-up cardiology as outpatient  Discharge Diagnoses: Principal Problem:   Sepsis (HCC) Active Problems:   Endometrial cancer (HCC)   Atrial fibrillation with RVR (HCC)   Hyperlipidemia, unspecified   HTN (hypertension)   Gastroesophageal reflux disease   Paroxysmal atrial fibrillation (HCC)   Liver mass, left lobe   Hyperbilirubinemia   SIRS (systemic inflammatory response syndrome) (HCC)  Resolved Problems:   * No resolved hospital problems. *  Hospital Course: 83 y.o. female with a history of PAF on eliquis , CAD, GAD, HTN, HFrEF, endometrial CA who presented to the ED on 06/06/2024 with numerous complaints, including diffuse mild abdominal pain, has developed cough,  chest congestion and throwing up.  She also reports generalized malaise and fatigue which has been progressive and getting worse. She was tachycardic, tachypneic, hypertensive in the ED. CT abdomen and pelvis ruled out pulmonary embolism but shows large rim-enhancing mass in the left lobe of liver with associated segmental biliary duct obstruction and atrophy of left lobe finding consistent with malignancy.    She was admitted with GI and oncology consultations. Hospitalization complicated by AFib with RVR for which amiodarone was initiated. Hypotension required brief levophed infusion. Cardiology has been following and palliative care has also been consulted.    GI has recommended IR biopsy. IR has reviewed the case and images, recommends continuing heparin for anticoagulation and stopping 4 hours prior to procedure, will plan U/S guided biopsy, possibly with paracentesis as well.     Assessment and Plan:  Suspected sepsis due to pneumonia with mixed shock state:  Hypotension related to AFib w/RVR, sepsis, hypovolemia, now resolved.  Patient presented with tachycardia, tachypnea, nausea, vomiting, elevated liver enzymes, met SIRS criteria.   CTA chest shows suspected atelectasis, ruled out PE. - Completed 5 days of antibiotics, ceftriaxone  and azithromycin  - Blood cultures x 2 showed no growth - Weaned from pressor, improved after return to NSR.     Acute hypoxemic respiratory failure -Required oxygen in the hospital, chest x-ray showed pneumonia versus pulmonary edema -Completed 5 days of antibiotics also given Lasix in the hospital -BNP was elevated at 815.8 -Oxygen has been weaned off to room air -On ambulation O2 sats remained at 90% - Will follow-up with cardiology for further recommendations, will not discharge on Lasix.   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  - Needs tissue diagnosis. D/w Dr. Vanice who believes this is amenable to U/S-guided biopsy,  -Biopsy was done on 06/13/2024 - Monitor LFTs, Follow up tumor markers. - GI following.  -Will make n.p.o. after midnight   CAD with demand ischemia; - Hx LAD PCI 2017, had some chest pain with rapid rates that subsided, mildly elevated Tn and flat. No further intervention planned. Continue statin, anticoagulation.    Essential hypertension; - Was hypotensive requiring levophed - Blood pressure is now elevated, losartan  restarted -Continue metoprolol  -Dose of losartan  has been cut down to 25 mg daily   PAF, A-fib with RVR, Hypotension: - Converted to NSR 10/8 AM, converted amiodarone gtt to po per cardiology. 200mg  po BID x3 weeks, then 200mg  once daily (watch LFTs)  - Continue heparin gtt in place of PTA  eliquis , though can switch back to DOAC after biopsy.  - Echocardiogram showed EF 35%, grade 1 diastolic dysfunction - Pt of Dr. Denver. Note  patient/family would [facility].  -Continue metoprolol  -Eliquis  can be restarted after biopsy on 06/14/2024 evening   Hyperlipidemia : - Continue rosuvastatin .   Goals of care discussion: - Extensive discussions with multiple disciplines and family members. Pt opts for full code, would like to pursue treatments depending on the outcome of the biopsy. Palliative has also engaged with patient/family.        Consultants: IR, cardiology Procedures performed:  Disposition: Home Diet recommendation:  Discharge Diet Orders (From admission, onward)     Start     Ordered   06/13/24 0000  Diet - low sodium heart healthy        06/13/24 1646           Regular diet DISCHARGE MEDICATION: Allergies as of 06/13/2024   No Known Allergies      Medication List     TAKE these medications    acetaminophen  500 MG tablet Commonly known as: TYLENOL  Take 500 mg by mouth every 6 (six) hours as needed for moderate pain (pain score 4-6).   amiodarone 200 MG tablet Commonly known as: PACERONE Take 1 tablet (200 mg total) by mouth daily. Take amiodarone 200 mg p.o. twice daily for 3 weeks starting from 06/08/2024 till 06/29/2024.  Then start taking amiodarone 200 mg p.o. daily   apixaban  5 MG Tabs tablet Commonly known as: Eliquis  Take 1 tablet (5 mg total) by mouth 2 (two) times daily.   losartan  25 MG tablet Commonly known as: COZAAR  Take 1 tablet (25 mg total) by mouth daily. Start taking on: June 14, 2024 What changed:  medication strength how much to take   magnesium  oxide 400 MG tablet Commonly known as: MAG-OX Take 1 tablet (400 mg total) by mouth daily.   metoprolol  tartrate 25 MG tablet Commonly known as: LOPRESSOR  Take 1 tablet (25 mg total) by mouth 2 (two) times daily.   omeprazole 20 MG tablet Commonly known as: PRILOSEC OTC Take 20 mg by mouth daily as needed (for heartburn).   rosuvastatin  5 MG tablet Commonly known as: CRESTOR  Take  1 tablet by mouth once daily        Follow-up Information     Pradhan, Pradeep K, MD. Schedule an appointment as soon as possible for a visit.   Specialty: Internal Medicine Contact information: [address] [place] [address] [phone]                Discharge Exam: Fredricka Weights   06/06/24 9261 06/06/24 1333  Weight: 59 kg 64 kg   General-appears in no acute distress Heart-S1-S2, regular, no murmur auscultated Lungs-clear to auscultation bilaterally, no wheezing or crackles auscultated Abdomen-soft, nontender, no organomegaly Extremities-no edema in the lower extremities Neuro-alert, oriented x3, no focal deficit noted  Condition at discharge: good  The results of significant diagnostics from this hospitalization (including imaging, microbiology, ancillary and laboratory) are listed below for reference.   Imaging Studies: US  ASCITES (ABDOMEN LIMITED) Result Date: 06/13/2024 CLINICAL DATA:  Ascites EXAM: LIMITED ABDOMEN ULTRASOUND FOR ASCITES TECHNIQUE: Limited ultrasound survey for ascites was performed in all four abdominal quadrants. COMPARISON:  CT scan of the and pelvis 06/06/2024 FINDINGS: The abdomen was interrogated with ultrasound. No significant ascites is visualized. Paracentesis was deferred. IMPRESSION: No significant ascites at the time of evaluation. Small right pleural effusion. Electronically Signed  By: Wilkie Lent M.D.   On: 06/13/2024 11:08   US  BIOPSY (LIVER) Result Date: 06/13/2024 INDICATION: 83 year old female with biliary obstruction in central obstructing mass highly concerning for cholangiocarcinoma. She presents for ultrasound-guided core biopsy EXAM: ULTRASOUND BIOPSY CORE LIVER MEDICATIONS: None. ANESTHESIA/SEDATION: Moderate (conscious) sedation was employed during this procedure. A total of Versed 1 mg and Fentanyl 25 mcg was administered intravenously by the Radiology nurse. Moderate Sedation Time: 10 minutes. The patient's  level of consciousness and vital signs were monitored continuously by radiology nursing throughout the procedure under my direct supervision. FLUOROSCOPY TIME:  None. COMPLICATIONS: None immediate. PROCEDURE: Informed written consent was obtained from the patient after a thorough discussion of the procedural risks, benefits and alternatives. All questions were addressed. Maximal Sterile Barrier Technique was utilized including caps, mask, sterile gowns, sterile gloves, sterile drape, hand hygiene and skin antiseptic. A timeout was performed prior to the initiation of the procedure. The right upper quadrant was interrogated with ultrasound. A suitable skin entry site was selected and marked. The region was sterilely prepped and draped in the standard fashion using chlorhexidine skin prep. Local anesthesia was attained by infiltration with 1% lidocaine. A small dermatotomy was made. Under real-time ultrasound guidance, a 17 gauge introducer needle was carefully advanced through the liver between 2 dilated bile ducts and into the periphery of the barely visible central hepatic mass. Multiple 18 gauge core biopsies were then obtained coaxially using the bio Pince automated biopsy device. As the introducer needle was removed, the biopsy tract was embolized with a Gel-Foam slurry. Post biopsy ultrasound imaging demonstrates no evidence of immediate complication. IMPRESSION: Successful ultrasound-guided core biopsy of central obstructing liver mass. Electronically Signed   By: Wilkie Lent M.D.   On: 06/13/2024 11:07   DG Chest Port 1V same Day Result Date: 06/10/2024 CLINICAL DATA:  Hypoxia EXAM: PORTABLE CHEST 1 VIEW COMPARISON:  04/18/2022 and CT chest 06/06/2024 FINDINGS: Interstitial accentuation in the lungs, some of which may be related to the reticulonodular and clustered tree-in-bud nodularity shown on the CT chest from 06/06/2024. The airway thickening shown on that exam is mildly improved. There is some  patchy and indistinct airspace opacity at the lung bases. Trace blunting the lateral costophrenic angles. Atherosclerotic calcification of the aortic arch. Coronary stent noted. Bony demineralization is present. Notable thoracic spondylosis. IMPRESSION: 1. Interstitial accentuation in the lungs with patchy indistinct basilar airspace opacity. Appearance could be from noncardiogenic edema or atypical pneumonia. 2. Trace blunting of the lateral costophrenic angles, possibly from trace pleural effusions. 3. Bony demineralization. 4. Aortic Atherosclerosis (ICD10-I70.0). Electronically Signed   By: Ryan Salvage M.D.   On: 06/10/2024 14:18   ECHOCARDIOGRAM COMPLETE Result Date: 06/08/2024    ECHOCARDIOGRAM REPORT   Patient Name:   DEREON WILLIAMSEN Squyres Date of Exam: 06/08/2024 Medical Rec #:  [id number]         Height:       65.0 in Accession #:    [id number]        Weight:       141.0 lb Date of Birth:  [birth year]         BSA:          1.705 m Patient Age:    82 years          BP:           100/56 mmHg Patient Gender: F                 HR:  74 bpm. Exam Location:  [facility] Procedure: 2D Echo, Cardiac Doppler and Color Doppler (Both Spectral and Color            Flow Doppler were utilized during procedure). Indications:    Murmur R01.1  History:        Patient has prior history of Echocardiogram examinations, most                 recent 04/08/2023. CHF; Risk Factors:Dyslipidemia and                 Hypertension.  Sonographer:    Aida Pizza RCS Referring Phys: [id number] VISHNU P MALLIPEDDI IMPRESSIONS  1. Left ventricular ejection fraction, by estimation, is >75%. The left ventricle has hyperdynamic function. The left ventricle has no regional wall motion abnormalities. Left ventricular diastolic parameters are consistent with Grade I diastolic dysfunction (impaired relaxation).  2. Right ventricular systolic function is normal. The right ventricular size is normal.  3. Left atrial size was severely dilated.   4. Right atrial size was mildly dilated.  5. The mitral valve is abnormal. No evidence of mitral valve regurgitation. No evidence of mitral stenosis. Moderate mitral annular calcification.  6. Tricuspid valve regurgitation is moderate.  7. The aortic valve is tricuspid. Aortic valve regurgitation is not visualized. No aortic stenosis is present.  8. The inferior vena cava is normal in size with greater than 50% respiratory variability, suggesting right atrial pressure of 3 mmHg. FINDINGS  Left Ventricle: Left ventricular ejection fraction, by estimation, is >75%. The left ventricle has hyperdynamic function. The left ventricle has no regional wall motion abnormalities. Strain was performed and the global longitudinal strain is indeterminate. The left ventricular internal cavity size was normal in size. There is no left ventricular hypertrophy. Left ventricular diastolic function could not be evaluated due to mitral annular calcification (moderate or greater). Left ventricular diastolic parameters are consistent with Grade I diastolic dysfunction (impaired relaxation). Right Ventricle: The right ventricular size is normal. No increase in right ventricular wall thickness. Right ventricular systolic function is normal. Left Atrium: Left atrial size was severely dilated. Right Atrium: Right atrial size was mildly dilated. Pericardium: There is no evidence of pericardial effusion. Mitral Valve: The mitral valve is abnormal. Moderate mitral annular calcification. No evidence of mitral valve regurgitation. No evidence of mitral valve stenosis. MV peak gradient, 11.0 mmHg. The mean mitral valve gradient is 2.0 mmHg. Tricuspid Valve: The tricuspid valve is normal in structure. Tricuspid valve regurgitation is moderate . No evidence of tricuspid stenosis. Aortic Valve: The aortic valve is tricuspid. Aortic valve regurgitation is not visualized. No aortic stenosis is present. Pulmonic Valve: The pulmonic valve was grossly  normal. Pulmonic valve regurgitation is mild. No evidence of pulmonic stenosis. Aorta: The aortic root is normal in size and structure. Venous: The inferior vena cava is normal in size with greater than 50% respiratory variability, suggesting right atrial pressure of 3 mmHg. IAS/Shunts: No atrial level shunt detected by color flow Doppler. Additional Comments: 3D was performed not requiring image post processing on an independent workstation and was indeterminate.  LEFT VENTRICLE PLAX 2D LVIDd:         4.20 cm   Diastology LVIDs:         1.90 cm   LV e' medial:    7.09 cm/s LV PW:         1.20 cm   LV E/e' medial:  23.4 LV IVS:        1.10  cm   LV e' lateral:   8.45 cm/s LVOT diam:     1.60 cm   LV E/e' lateral: 19.6 LV SV:         50 LV SV Index:   29 LVOT Area:     2.01 cm  RIGHT VENTRICLE RV S prime:     16.10 cm/s TAPSE (M-mode): 2.7 cm LEFT ATRIUM             Index        RIGHT ATRIUM           Index LA diam:        3.90 cm 2.29 cm/m   RA Area:     18.50 cm LA Vol (A2C):   82.6 ml 48.44 ml/m  RA Volume:   51.90 ml  30.44 ml/m LA Vol (A4C):   93.3 ml 54.72 ml/m LA Biplane Vol: 88.6 ml 51.96 ml/m  AORTIC VALVE LVOT Vmax:   134.67 cm/s LVOT Vmean:  102.600 cm/s LVOT VTI:    0.247 m  AORTA Ao Root diam: 3.40 cm MITRAL VALVE                TRICUSPID VALVE MV Area (PHT): 3.03 cm     TR Peak grad:   71.9 mmHg MV Area VTI:   1.20 cm     TR Vmax:        424.00 cm/s MV Peak grad:  11.0 mmHg MV Mean grad:  2.0 mmHg     SHUNTS MV Vmax:       1.66 m/s     Systemic VTI:  0.25 m MV Vmean:      63.6 cm/s    Systemic Diam: 1.60 cm MV Decel Time: 250 msec MV E velocity: 166.00 cm/s MV A velocity: 95.90 cm/s MV E/A ratio:  1.73 Vishnu Priya Mallipeddi Electronically signed by Diannah Late Mallipeddi Signature Date/Time: 06/08/2024/4:55:15 PM    Final    MR ABDOMEN WITH MRCP W CONTRAST Result Date: 06/06/2024 EXAM: MRCP WITH IV CONTRAST 06/06/2024 06:52:40 PM TECHNIQUE: Multisequence, multiplanar magnetic resonance  images of the abdomen with intravenous contrast. MRCP sequences were performed. 7 mL (gadobutrol (GADAVIST) 1 MMOL/ML injection 7 mL GADOBUTROL 1 MMOL/ML IV SOLN). COMPARISON: CT of earlier today. CLINICAL HISTORY: [id number] Liver mass [id number]. Table formatting from the original note was not included.; Liver mass Liver mass. FINDINGS: LIMITATIONS/ARTIFACTS: Mild motion degradation throughout. LIVER: No cirrhosis. Segment 4a 3.8 x 5.1 cm ill-defined mass with delayed hyper-enhancement including on image 39 / 27. GALLBLADDER AND BILIARY SYSTEM: Cholecystectomy. Marked intrahepatic biliary duct dilatation including throughout the majority of the left hepatic lobe and minimally into segment 8, secondary to the segment 4a mass. Just upstream from the obstruction, an intraductal stone of 9 mm is identified included on image 19 / 4 and 9 / 3. No common duct dilatation. SPLEEN: Unremarkable. PANCREAS/PANCREATIC DUCT: Visualized pancreas is unremarkable. No pancreatic ductal dilatation. ADRENAL GLANDS: Unremarkable. KIDNEYS: Bilateral nonenhancing renal lesions of up to renal 2.8 cm are likely cysts and do not warrant imaging follow up. Mild left renal cortical thinning. LYMPH NODES: Right Cardiophrenic Angle adenopathy including at 1.0 cm on image 33 / 27. No enlarged abdominal lymph nodes. VASCULATURE: Advanced aortic atherosclerosis. Patent portal and splenic veins. PERITONEUM: Small volume perihepatic ascites. ABDOMINAL WALL: No hernia. No mass. BONES: Moderate convex right lumbar spine curvature. No acute abnormality or worrisome osseous lesion. SOFT TISSUES: Moderate right hemidiaphragm elevation. Small bilateral pleural effusions. MISCELLANEOUS: Unremarkable. IMPRESSION: 1. 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 Metastatic disease from colon cancer could have a similar appearance  but is felt less likely. 2.  intraductal 9 mm stone just upstream from the obstructive mass 3. tiny bilateral pleural effusions and small volume perihepatic ascites. 4.  right Cardiophrenic Angle adenopathy, again suspicious for nodal metastasis. 5.  mild motion degradation throughout. Electronically signed by: Rockey Kilts MD 06/06/2024 07:22 PM EDT RP Workstation: [id number]   MR 3D Recon At Scanner Result Date: 06/06/2024 EXAM: MRCP WITH IV CONTRAST 06/06/2024 06:52:40 PM TECHNIQUE: Multisequence, multiplanar magnetic resonance images of the abdomen with intravenous contrast. MRCP sequences were performed. 7 mL (gadobutrol (GADAVIST) 1 MMOL/ML injection 7 mL GADOBUTROL 1 MMOL/ML IV SOLN). COMPARISON: CT of earlier today. CLINICAL HISTORY: [id number] Liver mass [id number]. Table formatting from the original note was not included.; Liver mass Liver mass. FINDINGS: LIMITATIONS/ARTIFACTS: Mild motion degradation throughout. LIVER: No cirrhosis. Segment 4a 3.8 x 5.1 cm ill-defined mass with delayed hyper-enhancement including on image 39 / 27. GALLBLADDER AND BILIARY SYSTEM: Cholecystectomy. Marked intrahepatic biliary duct dilatation including throughout the majority of the left hepatic lobe and minimally into segment 8, secondary to the segment 4a mass. Just upstream from the obstruction, an intraductal stone of 9 mm is identified included on image 19 / 4 and 9 / 3. No common duct dilatation. SPLEEN: Unremarkable. PANCREAS/PANCREATIC DUCT: Visualized pancreas is unremarkable. No pancreatic ductal dilatation. ADRENAL GLANDS: Unremarkable. KIDNEYS: Bilateral nonenhancing renal lesions of up to renal 2.8 cm are likely cysts and do not warrant imaging follow up. Mild left renal cortical thinning. LYMPH NODES: Right Cardiophrenic Angle adenopathy including at 1.0 cm on image 33 / 27. No enlarged abdominal lymph nodes. VASCULATURE: Advanced aortic atherosclerosis. Patent portal and splenic veins. PERITONEUM: Small volume  perihepatic ascites. ABDOMINAL WALL: No hernia. No mass. BONES: Moderate convex right lumbar spine curvature. No acute abnormality or worrisome osseous lesion. SOFT TISSUES: Moderate right hemidiaphragm elevation. Small bilateral pleural effusions. MISCELLANEOUS: Unremarkable. IMPRESSION: 1. 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 Metastatic disease from colon cancer could have a similar appearance but is felt less likely. 2.  intraductal 9 mm stone just upstream from the obstructive mass 3. tiny bilateral pleural effusions and small volume perihepatic ascites. 4.  right Cardiophrenic Angle adenopathy, again suspicious for nodal metastasis. 5.  mild motion degradation throughout. Electronically signed by: Rockey Kilts MD 06/06/2024 07:22 PM EDT RP Workstation: [id number]   CT ABDOMEN PELVIS W CONTRAST Result Date: 06/06/2024 CLINICAL DATA:  PE suspected, cough, vomiting * Tracking Code: BO * EXAM: CT ANGIOGRAPHY CHEST CT ABDOMEN AND PELVIS WITH CONTRAST TECHNIQUE: Multidetector CT imaging of the chest was performed using the standard protocol during bolus administration of intravenous contrast. Multiplanar CT image reconstructions and MIPs were obtained to evaluate the vascular anatomy. Multidetector CT imaging of the abdomen and pelvis was performed using the standard protocol during bolus administration of intravenous contrast. RADIATION DOSE REDUCTION: This exam was performed according to the departmental dose-optimization program which includes automated exposure control, adjustment of the mA and/or kV according to patient size and/or use of iterative reconstruction technique. CONTRAST:  75mL OMNIPAQUE IOHEXOL 350 MG/ML SOLN COMPARISON:  None Available. FINDINGS: CT CHEST ANGIOGRAM FINDINGS Cardiovascular: Satisfactory opacification of the pulmonary arteries to the  segmental level. No evidence of pulmonary embolism. Normal heart size.  Three-vessel coronary artery calcifications. Enlargement of the main pulmonary artery measuring up to 3.6 cm in caliber. No pericardial effusion. Severe aortic atherosclerosis. Mediastinum/Nodes: Enlarged right cardiophrenic angle lymph nodes measuring up to 1.4 x 1.1 cm (series 2, image 16). No other enlarged mediastinal, hilar, or axillary lymph nodes. Thyroid gland, trachea, and esophagus demonstrate no significant findings. Lungs/Pleura: Diffuse bilateral bronchial wall thickening. Extensive clustered nodular airspace opacity throughout the lungs, particularly in the posterior upper lobes (series 6, image 45) although with several more discrete nodular appearing opacities throughout the lungs, for example in the peripheral anterior right lower lobe measuring 1.1 x 0.9 cm (series 6, image 98). No pleural effusion or pneumothorax. Musculoskeletal: No chest wall abnormality. No acute osseous findings. Review of the MIP images confirms the above findings. CT ABDOMEN PELVIS FINDINGS Hepatobiliary: Large, rim enhancing mass in the left lobe of the liver, centered in hepatic segment IV, measuring 5.7 x 4.8 cm with associated segmental biliary ductal obstruction and atrophy of the left lobe (series 2, image 21). No gallstones, gallbladder wall thickening, or extrahepatic biliary dilatation. Pancreas: Unremarkable. No pancreatic ductal dilatation or surrounding inflammatory changes. Spleen: Normal in size without significant abnormality. Adrenals/Urinary Tract: Adrenal glands are unremarkable. Simple, benign bilateral renal cortical cysts. Kidneys are otherwise normal, without renal calculi, solid lesion, or hydronephrosis. Bladder is unremarkable. Stomach/Bowel: Stomach is within normal limits. Appendix appears normal. No evidence of bowel wall thickening, distention, or inflammatory changes. Descending and sigmoid diverticulosis. Vascular/Lymphatic:  Severe aortic atherosclerosis. No enlarged abdominal or pelvic lymph nodes. Reproductive: No mass or other significant abnormality. Other: No abdominal wall hernia or abnormality. Small volume perihepatic ascites. Musculoskeletal: No acute or significant osseous findings. IMPRESSION: 1. Negative examination for pulmonary embolism. 2. Large, rim enhancing mass in the left lobe of the liver, centered in hepatic segment IV, measuring 5.7 x 4.8 cm with associated segmental biliary ductal obstruction and atrophy of the left lobe. 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 3. Enlarged right cardiophrenic angle lymph nodes, highly suspicious for nodal metastatic disease. 4. Small volume perihepatic ascites. 5. Extensive clustered nodular airspace opacity throughout the lungs, particularly in the posterior upper lobes, with several more discrete nodular appearing opacities throughout the lungs. There is some component of atypical infection, however small pulmonary metastases amongst these airspace opacities very difficult to confidently exclude. 6. Diffuse bilateral bronchial wall thickening, consistent with nonspecific infectious or inflammatory bronchitis. 7. Enlargement of the main pulmonary artery, as can be seen in pulmonary hypertension. 8. Coronary artery disease. Aortic Atherosclerosis (ICD10-I70.0). Electronically Signed   By: Marolyn JONETTA Jaksch M.D.   On: 06/06/2024 10:17   CT Angio Chest PE W and/or Wo Contrast Result Date: 06/06/2024 CLINICAL DATA:  PE suspected, cough, vomiting * Tracking Code: BO * EXAM: CT ANGIOGRAPHY CHEST CT ABDOMEN AND PELVIS WITH CONTRAST TECHNIQUE: Multidetector CT imaging of the chest was performed using the standard protocol during bolus administration of intravenous contrast. Multiplanar CT image reconstructions and MIPs were obtained to evaluate the  vascular anatomy. Multidetector CT imaging of the abdomen and pelvis was performed using the standard protocol during bolus administration of intravenous contrast. RADIATION DOSE REDUCTION: This exam was performed according to the departmental dose-optimization program which includes automated exposure control, adjustment of the mA and/or kV according to patient size and/or use of iterative reconstruction technique. CONTRAST:  75mL OMNIPAQUE IOHEXOL 350 MG/ML SOLN COMPARISON:  None Available. FINDINGS: CT CHEST ANGIOGRAM  FINDINGS Cardiovascular: Satisfactory opacification of the pulmonary arteries to the segmental level. No evidence of pulmonary embolism. Normal heart size. Three-vessel coronary artery calcifications. Enlargement of the main pulmonary artery measuring up to 3.6 cm in caliber. No pericardial effusion. Severe aortic atherosclerosis. Mediastinum/Nodes: Enlarged right cardiophrenic angle lymph nodes measuring up to 1.4 x 1.1 cm (series 2, image 16). No other enlarged mediastinal, hilar, or axillary lymph nodes. Thyroid gland, trachea, and esophagus demonstrate no significant findings. Lungs/Pleura: Diffuse bilateral bronchial wall thickening. Extensive clustered nodular airspace opacity throughout the lungs, particularly in the posterior upper lobes (series 6, image 45) although with several more discrete nodular appearing opacities throughout the lungs, for example in the peripheral anterior right lower lobe measuring 1.1 x 0.9 cm (series 6, image 98). No pleural effusion or pneumothorax. Musculoskeletal: No chest wall abnormality. No acute osseous findings. Review of the MIP images confirms the above findings. CT ABDOMEN PELVIS FINDINGS Hepatobiliary: Large, rim enhancing mass in the left lobe of the liver, centered in hepatic segment IV, measuring 5.7 x 4.8 cm with associated segmental biliary ductal obstruction and atrophy of the left lobe (series 2, image 21). No gallstones, gallbladder wall  thickening, or extrahepatic biliary dilatation. Pancreas: Unremarkable. No pancreatic ductal dilatation or surrounding inflammatory changes. Spleen: Normal in size without significant abnormality. Adrenals/Urinary Tract: Adrenal glands are unremarkable. Simple, benign bilateral renal cortical cysts. Kidneys are otherwise normal, without renal calculi, solid lesion, or hydronephrosis. Bladder is unremarkable. Stomach/Bowel: Stomach is within normal limits. Appendix appears normal. No evidence of bowel wall thickening, distention, or inflammatory changes. Descending and sigmoid diverticulosis. Vascular/Lymphatic: Severe aortic atherosclerosis. No enlarged abdominal or pelvic lymph nodes. Reproductive: No mass or other significant abnormality. Other: No abdominal wall hernia or abnormality. Small volume perihepatic ascites. Musculoskeletal: No acute or significant osseous findings. IMPRESSION: 1. Negative examination for pulmonary embolism. 2. Large, rim enhancing mass in the left lobe of the liver, centered in hepatic segment IV, measuring 5.7 x 4.8 cm with associated segmental biliary ductal obstruction and atrophy of the left lobe. 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 3. Enlarged right cardiophrenic angle lymph nodes, highly suspicious for nodal metastatic disease. 4. Small volume perihepatic ascites. 5. Extensive clustered nodular airspace opacity throughout the lungs, particularly in the posterior upper lobes, with several more discrete nodular appearing opacities throughout the lungs. There is some component of atypical infection, however small pulmonary metastases amongst these airspace opacities very difficult to confidently exclude. 6. Diffuse bilateral bronchial wall thickening, consistent with nonspecific infectious or inflammatory bronchitis. 7. Enlargement of the  main pulmonary artery, as can be seen in pulmonary hypertension. 8. Coronary artery disease. Aortic Atherosclerosis (ICD10-I70.0). Electronically Signed   By: Marolyn JONETTA Jaksch M.D.   On: 06/06/2024 10:17    Microbiology: Results for orders placed or performed during the hospital encounter of 06/06/24  Resp panel by RT-PCR (RSV, Flu A&B, Covid) Anterior Nasal Swab     Status: None   Collection Time: 06/06/24  8:58 AM   Specimen: Anterior Nasal Swab  Result Value Ref Range Status   SARS Coronavirus 2 by RT PCR NEGATIVE NEGATIVE Final    Comment: (NOTE) SARS-CoV-2 target nucleic acids are NOT DETECTED.  The SARS-CoV-2 RNA is generally detectable in upper respiratory specimens during the acute phase of infection. The lowest concentration of SARS-CoV-2 viral copies this assay can detect is 138 copies/mL. A negative result does not preclude SARS-Cov-2 infection and  should not be used as the sole basis for treatment or other patient management decisions. A negative result may occur with  improper specimen collection/handling, submission of specimen other than nasopharyngeal swab, presence of viral mutation(s) within the areas targeted by this assay, and inadequate number of viral copies(<138 copies/mL). A negative result must be combined with clinical observations, patient history, and epidemiological information. The expected result is Negative.  Fact Sheet for Patients:  BloggerCourse.com  Fact Sheet for Healthcare Providers:  SeriousBroker.it  This test is no t yet approved or cleared by the [place]  FDA and  has been authorized for detection and/or diagnosis of SARS-CoV-2 by FDA under an Emergency Use Authorization (EUA). This EUA will remain  in effect (meaning this test can be used) for the duration of the COVID-19 declaration under Section 564(b)(1) of the Act, 21 U.S.C.section 360bbb-3(b)(1), unless the authorization is  terminated  or revoked sooner.       Influenza A by PCR NEGATIVE NEGATIVE Final   Influenza B by PCR NEGATIVE NEGATIVE Final    Comment: (NOTE) The Xpert Xpress SARS-CoV-2/FLU/RSV plus assay is intended as an aid in the diagnosis of influenza from Nasopharyngeal swab specimens and should not be used as a sole basis for treatment. Nasal washings and aspirates are unacceptable for Xpert Xpress SARS-CoV-2/FLU/RSV testing.  Fact Sheet for Patients: BloggerCourse.com  Fact Sheet for Healthcare Providers: SeriousBroker.it  This test is not yet approved or cleared by the [place]  FDA and has been authorized for detection and/or diagnosis of SARS-CoV-2 by FDA under an Emergency Use Authorization (EUA). This EUA will remain in effect (meaning this test can be used) for the duration of the COVID-19 declaration under Section 564(b)(1) of the Act, 21 U.S.C. section 360bbb-3(b)(1), unless the authorization is terminated or revoked.     Resp Syncytial Virus by PCR NEGATIVE NEGATIVE Final    Comment: (NOTE) Fact Sheet for Patients: BloggerCourse.com  Fact Sheet for Healthcare Providers: SeriousBroker.it  This test is not yet approved or cleared by the [place]  FDA and has been authorized for detection and/or diagnosis of SARS-CoV-2 by FDA under an Emergency Use Authorization (EUA). This EUA will remain in effect (meaning this test can be used) for the duration of the COVID-19 declaration under Section 564(b)(1) of the Act, 21 U.S.C. section 360bbb-3(b)(1), unless the authorization is terminated or revoked.  Performed at [facility], [address] [place], [place] [address]   Blood culture (routine x 2)     Status: None   Collection Time: 06/06/24 12:12 PM   Specimen: BLOOD  Result Value Ref Range Status   Specimen Description BLOOD BLOOD LEFT ARM  Final   Special Requests    Final    BOTTLES DRAWN AEROBIC AND ANAEROBIC Blood Culture adequate volume   Culture   Final    NO GROWTH 5 DAYS Performed at [facility], [address] [place], [place] [address]    Report Status 06/11/2024 FINAL  Final  Blood culture (routine x 2)     Status: None   Collection Time: 06/06/24 12:40 PM   Specimen: BLOOD  Result Value Ref Range Status   Specimen Description BLOOD BLOOD RIGHT HAND AEROBIC BOTTLE ONLY  Final   Special Requests   Final    Blood Culture results may not be optimal due to an inadequate volume of blood received in culture bottles BOTTLES DRAWN AEROBIC ONLY   Culture   Final    NO GROWTH 5 DAYS Performed at [facility]  [facility], [address], [place], [place] [address]    Report Status 06/11/2024 FINAL  Final    Labs: CBC: Recent Labs  Lab 06/08/24 0635 06/09/24 0019 06/10/24 0517 06/11/24 0711 06/12/24 0901  WBC 8.4 6.9 6.8 8.0 7.9  HGB 12.1 11.4* 12.3 12.4 12.2  HCT 37.7 35.5* 38.1 38.8 38.4  MCV 96.4 97.0 95.7 96.0 96.7  PLT 308 232 266 313 321   Basic Metabolic Panel: Recent Labs  Lab 06/07/24 0451 06/08/24 0635 06/09/24 0019 06/10/24 0517 06/11/24 0711 06/12/24 0901 06/13/24 0649  NA 135 133* 134* 136 137 142 138  K 3.5 3.6 3.4* 4.5 4.0 3.5 3.7  CL 96* 95* 98 95* 92* 90* 87*  CO2 26 25 29 31  35* 34* 36*  GLUCOSE 89 146* 118* 106* 97 137* 104*  BUN 10 15 12  7* 11 13 16   CREATININE 0.63 0.84 0.76 0.80 0.85 0.86 0.85  CALCIUM  8.6* 8.6* 8.6* 8.4* 8.6* 8.5* 8.6*  MG 1.4* 2.1  --   --   --   --   --   PHOS 2.9 3.0  --   --   --   --   --    Liver Function Tests: Recent Labs  Lab 06/07/24 0451 06/09/24 0019 06/10/24 0517  AST 18 22 17   ALT 7 6 10   ALKPHOS 76 79 61  BILITOT 1.5* 0.5 0.9  PROT 6.2* 6.1* 6.1*  ALBUMIN 3.1* 2.9* 2.3*   CBG: No results for input(s): GLUCAP in the last 168 hours.  Discharge time spent: greater than 30 minutes.  Signed: Sabas GORMAN Brod, MD [facility] 06/13/2024

## 2024-06-13 NOTE — Procedures (Signed)
 Interventional Radiology Procedure Note  Procedure: US  guided core biopsy of liver mass  Complications: None  Estimated Blood Loss: None  Recommendations: - Path sent - Bedrest x 2 hrs   Signed,  Wilkie LOIS Lent, MD

## 2024-06-15 LAB — SURGICAL PATHOLOGY

## 2024-06-17 ENCOUNTER — Telehealth: Payer: Self-pay

## 2024-06-17 NOTE — Telephone Encounter (Signed)
 Patient's daughter, Jerel Blumenthal, called with questions regarding biopsy results. Dr. Davonna to review the chart and let me know what follow-up is needed. I have attempted to call Jerel back to let her know but was unable to reach her directly. Detailed VM left with the above information.

## 2024-06-20 NOTE — Progress Notes (Signed)
 Per Dr. Davonna, order CancerType ID and [facility] NGS and have the patient follow-up in split visit with her and Randall Hope, NP. CancerType ID ordered. Patient's daughter, Jerel made aware and appointment date set for 11/5

## 2024-06-21 ENCOUNTER — Encounter: Payer: Self-pay | Admitting: Cardiology

## 2024-06-27 NOTE — Telephone Encounter (Signed)
 Pt notified of Mds suggestions via MyChart. Last read by Inocente T. Delapena at 2:43PM on 06/25/2024.

## 2024-06-28 ENCOUNTER — Inpatient Hospital Stay: Attending: Oncology | Admitting: Oncology

## 2024-06-28 ENCOUNTER — Encounter (HOSPITAL_COMMUNITY): Payer: Self-pay

## 2024-06-28 VITALS — BP 208/79 | HR 62 | Temp 98.1°F | Resp 17 | Ht 65.0 in | Wt 135.0 lb

## 2024-06-28 DIAGNOSIS — Z7189 Other specified counseling: Secondary | ICD-10-CM | POA: Diagnosis not present

## 2024-06-28 DIAGNOSIS — R590 Localized enlarged lymph nodes: Secondary | ICD-10-CM | POA: Diagnosis not present

## 2024-06-28 DIAGNOSIS — I4891 Unspecified atrial fibrillation: Secondary | ICD-10-CM | POA: Diagnosis not present

## 2024-06-28 DIAGNOSIS — Z87891 Personal history of nicotine dependence: Secondary | ICD-10-CM | POA: Diagnosis not present

## 2024-06-28 DIAGNOSIS — R918 Other nonspecific abnormal finding of lung field: Secondary | ICD-10-CM | POA: Diagnosis not present

## 2024-06-28 DIAGNOSIS — C221 Intrahepatic bile duct carcinoma: Secondary | ICD-10-CM | POA: Diagnosis present

## 2024-06-28 DIAGNOSIS — Z8052 Family history of malignant neoplasm of bladder: Secondary | ICD-10-CM | POA: Insufficient documentation

## 2024-06-28 DIAGNOSIS — Z7901 Long term (current) use of anticoagulants: Secondary | ICD-10-CM | POA: Diagnosis not present

## 2024-06-28 NOTE — Patient Instructions (Signed)
 [facility]  Discharge Instructions  You were seen and examined today by Dr. Davonna. Dr. Davonna is a medical oncologist, meaning that she specializes in the treatment of cancer diagnoses. Dr. Davonna discussed your past medical history, family history of cancers, and the events that led to you being here today.  Dr. 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 This is a typically aggressive cancer and if it has spread, it is associated with a poor prognosis. In order to identify the stage of the cancer, Dr. Davonna has recommended a PET scan.  Following the PET scan, Dr. Davonna will see you again to discuss treatment options.  Follow-up as scheduled.  Thank you for choosing [facility] to provide your oncology and hematology care.   To afford each patient quality time with our provider, please arrive at least 15 minutes before your scheduled appointment time. You may need to reschedule your appointment if you arrive late (10 or more minutes). Arriving late affects you and other patients whose appointments are after yours.  Also, if you miss three or more appointments without notifying the office, you may be dismissed from the clinic at the provider's discretion.    Again, thank you for choosing [facility].  Our hope is that these requests will decrease the amount of time that you wait before being seen by our physicians.   If you have a lab appointment with the Cancer Center - please note that after April 8th, all labs will be drawn in the cancer center.  You do not have to check in or register with the main entrance as you have in the past but will complete your check-in at the cancer center.            _____________________________________________________________  Should you have questions after your visit to [facility], please contact our office at [phone] and follow the prompts.  Our office hours are 8:00 a.m. to 4:30 p.m. Monday - Thursday and 8:00 a.m. to 2:30 p.m. Friday.  Please note that voicemails left after 4:00 p.m. may not be returned until the following business day.  We are closed weekends and all major holidays.  You do have access to a nurse 24-7, just call the main number to the clinic [phone] and do not press any options, hold on the line and a nurse will answer the phone.    For prescription refill requests, have your pharmacy contact our office and allow 72 hours.    Masks are no longer required in the cancer centers. If you would like for your care team to wear a mask while they are taking care of you, please let them know. You may have one support person who is at least 81 years old accompany you for your appointments.

## 2024-07-04 ENCOUNTER — Telehealth: Payer: Self-pay

## 2024-07-04 ENCOUNTER — Other Ambulatory Visit: Payer: Self-pay

## 2024-07-04 NOTE — Telephone Encounter (Signed)
 Pt's daughter called requesting to establish care with Dr. Lanny.  Reviewed pt's chart and pt has established care with Dr. Davonna but was referred to Dr. Lonn.  Pt's daughter stated that Dr. Lonn does not manage the pt's cancer.  Instructed pt's daughter to contact Dr. Armanda office to have a new referral sent to Dr. Lanny and this nurse will also make Dr. Lanny aware of the pt's request.  Pt's daughter verbalized understanding of the instructions.

## 2024-07-05 NOTE — Telephone Encounter (Signed)
 Referral was made yesterday, as she wants to keep her care with you since her husband also comes to [facility].

## 2024-07-06 ENCOUNTER — Inpatient Hospital Stay: Admitting: Oncology

## 2024-07-06 ENCOUNTER — Encounter (HOSPITAL_COMMUNITY): Payer: Self-pay

## 2024-07-06 NOTE — Progress Notes (Signed)
 Hematology-Oncology Clinic Note  [name], MD   Reason for Referral: Metastatic carcinoma  Oncology History: I have reviewed her chart and materials related to her cancer extensively and collaborated history with the patient. Summary of oncologic history is as follows:  Diagnosis: Metastatic cholangiocarcinoma  -06/06/2024: CT abdomen pelvis with contrast & CT chest angio: Large, rim enhancing mass in the left lobe of the liver, centered in hepatic segment IV, measuring 5.7 x 4.8 cm with associated segmental biliary ductal obstruction and atrophy of the left lobe. Findings are consistent with malignancy, differential considerations including both primary hepatic cholangiocarcinoma or renal cell carcinoma or alternately a solitary metastasis.Enlarged right cardiophrenic angle lymph nodes, highly suspicious for nodal metastatic disease. Extensive clustered nodular airspace opacity throughout the lungs, particularly in the posterior upper lobes, with several more discrete nodular appearing opacities throughout the lungs. There is some component of atypical infection, however small pulmonary metastases amongst these airspace opacities very difficult to confidently exclude. -06/07/2024: AFP: <1.8, CA 19-9:35, CEA:0.9 -06/13/2024: Ultrasound biopsy: Liver, central mass, needle core biopsy:  - Moderately differentiated adenocarcinoma. -The tumor is diffusely and strongly positive for cytokeratin 7 and cytokeratin 20. -The tumor is negative for CDX2 and the pulmonary adeno marker TTF-1. Cytokeratin 7 positivity is compatible with a biliary primary;  -However, cytokeratin 7 and cytokeratin 20 positivity would tend to favor pancreatic origin.  -06/13/2024: Cancer type ID: Pancreaticobiliary 90% ( Cholangiocarcinoma 78%, Gallbladder carcinoma 12%, Pancreatic <5%)   History of Presenting Illness: Discussed the use of AI scribe software for clinical note transcription with the patient, who  gave verbal consent to proceed.  History of Present Illness Gina Duncan is an 83 year old female with cholangiocarcinoma who presents for discussion of biopsy results and further management options. She is accompanied by her family members, including her daughter, son, daughter-in-law, husband.  Patient was recently admitted to the hospital for A-fib with RVR and a CT scan done at that time showed a liver mass.  A biopsy was done during that time and patient is here for follow-up of the results  She has been diagnosed with cholangiocarcinoma, confirmed through a biopsy of a liver mass. A prior CT scan revealed a mass in the liver, spots in the lung, and a lymph node near the diaphragm. The biopsy results indicated a 78% probability of cholangiocarcinoma, 12% probability of gallbladder cancer, and less than 5% probability of pancreatic cancer.   No current symptoms such as pain, nausea, or weight loss. She experienced weight loss during her hospital stay due to poor appetite but now feels great and is able to eat and drink normally.  Her past medical history includes a significant episode of pneumonia and a history of gallstones, which led to the removal of her gallbladder. Family history is notable for a brother who had bladder cancer at the age of 88. Social history reveals she smoked as a teenager but quit long ago. She does not consume alcohol.     Medical History: Past Medical History:  Diagnosis Date   CAD (coronary artery disease)    DES to LAD 2017   Endometrial cancer (HCC)    Hypertension    Radiation 06/01/15, 06/12/15, 06/15/15, 06/19/15, 06/21/15   proximal vagina 30 gray    Surgical history: Past Surgical History:  Procedure Laterality Date   cardiac stents  2017   CARDIOVERSION N/A 04/21/2022   Procedure: CARDIOVERSION;  Surgeon: Shlomo Wilbert SAUNDERS, MD;  Location: [facility] ENDOSCOPY;  Service: Cardiovascular;  Laterality: N/A;   CHOLECYSTECTOMY     ROBOTIC ASSISTED TOTAL  HYSTERECTOMY WITH BILATERAL SALPINGO OOPHERECTOMY  02/13/2015   by Dr. Arlee   TEE WITHOUT CARDIOVERSION N/A 04/21/2022   Procedure: TRANSESOPHAGEAL ECHOCARDIOGRAM (TEE);  Surgeon: Shlomo Wilbert SAUNDERS, MD;  Location: [facility] ENDOSCOPY;  Service: Cardiovascular;  Laterality: N/A;     Allergies:  has no known allergies.  Medications:  Current Outpatient Medications  Medication Sig Dispense Refill   acetaminophen  (TYLENOL ) 500 MG tablet Take 500 mg by mouth every 6 (six) hours as needed for moderate pain (pain score 4-6).     amiodarone (PACERONE) 200 MG tablet Take 1 tablet (200 mg total) by mouth daily. Take amiodarone 200 mg p.o. twice daily for 3 weeks starting from 06/08/2024 till 06/29/2024.  Then start taking amiodarone 200 mg p.o. daily 90 tablet 1   apixaban  (ELIQUIS ) 5 MG TABS tablet Take 1 tablet (5 mg total) by mouth 2 (two) times daily. 180 tablet 3   hydrochlorothiazide (HYDRODIURIL) 12.5 MG tablet Take 12.5 mg by mouth.     losartan  (COZAAR ) 25 MG tablet Take 1 tablet (25 mg total) by mouth daily. 30 tablet 2   magnesium  oxide (MAG-OX) 400 MG tablet Take 1 tablet (400 mg total) by mouth daily. 30 tablet 6   metoprolol  tartrate (LOPRESSOR ) 25 MG tablet Take 1 tablet (25 mg total) by mouth 2 (two) times daily. 60 tablet 11   omeprazole (PRILOSEC OTC) 20 MG tablet Take 20 mg by mouth daily as needed (for heartburn).     rosuvastatin  (CRESTOR ) 5 MG tablet Take 1 tablet by mouth once daily 90 tablet 1   No current facility-administered medications for this visit.    Review of Systems: Constitutional: Denies fevers, chills or abnormal night sweats Eyes: Denies blurriness of vision, double vision or watery eyes Ears, nose, mouth, throat, and face: Denies mucositis or sore throat Respiratory: Denies cough, dyspnea or wheezes Cardiovascular: Denies palpitation, chest discomfort or lower extremity swelling Gastrointestinal:  Denies nausea, heartburn or change in bowel habits Skin: Denies  abnormal skin rashes Lymphatics: Denies new lymphadenopathy or easy bruising Neurological:Denies numbness, tingling or new weaknesses Behavioral/Psych: Mood is stable, no new changes  All other systems were reviewed with the patient and are negative.  Physical Examination: ECOG PERFORMANCE STATUS: 1 - Symptomatic but completely ambulatory  Vitals:   06/28/24 1505 06/28/24 1511  BP: (!) 210/75 (!) 208/79  Pulse: 62   Resp: 17   Temp: 98.1 F (36.7 C)   SpO2: 98%    Filed Weights   06/28/24 1505  Weight: 135 lb (61.2 kg)    GENERAL:alert, no distress and comfortable SKIN: skin color, texture, turgor are normal, no rashes or significant lesions EYES: normal, conjunctiva are pink and non-injected, sclera clear LYMPH:  no palpable lymphadenopathy in the cervical, axillary or inguinal LUNGS: clear to auscultation and percussion with normal breathing effort HEART: regular rate & rhythm and no murmurs and no lower extremity edema ABDOMEN:abdomen soft, non-tender and normal bowel sounds Musculoskeletal:no cyanosis of digits and no clubbing  PSYCH: alert & oriented x 3 with fluent speech   Laboratory Data: I have reviewed the data as listed Lab Results  Component Value Date   WBC 7.9 06/12/2024   HGB 12.2 06/12/2024   HCT 38.4 06/12/2024   MCV 96.7 06/12/2024   PLT 321 06/12/2024   Recent Labs    06/06/24 0834 06/07/24 0451 06/08/24 9364 06/09/24 0019 06/10/24 0517 06/11/24 0711 06/12/24 0901 06/13/24 0649  NA  --  135   < > 134* 136 137 142 138  K  --  3.5   < > 3.4* 4.5 4.0 3.5 3.7  CL  --  96*   < > 98 95* 92* 90* 87*  CO2  --  26   < > 29 31 35* 34* 36*  GLUCOSE  --  89   < > 118* 106* 97 137* 104*  BUN  --  10   < > 12 7* 11 13 16   CREATININE  --  0.63   < > 0.76 0.80 0.85 0.86 0.85  CALCIUM   --  8.6*   < > 8.6* 8.4* 8.6* 8.5* 8.6*  GFRNONAA  --  >60   < > >60 >60 >60 >60 >60  PROT 7.8 6.2*  --  6.1* 6.1*  --   --   --   ALBUMIN 4.0 3.1*  --  2.9* 2.3*  --    --   --   AST 23 18  --  22 17  --   --   --   ALT 9 7  --  6 10  --   --   --   ALKPHOS 89 76  --  79 61  --   --   --   BILITOT 2.6* 1.5*  --  0.5 0.9  --   --   --   BILIDIR 1.2*  --   --   --   --   --   --   --   IBILI 1.4*  --   --   --   --   --   --   --    < > = values in this interval not displayed.    Radiographic Studies: I have personally reviewed the radiological images as listed and agreed with the findings in the report.  CLINICAL DATA:  PE suspected, cough, vomiting * Tracking Code: BO *   EXAM: CT ANGIOGRAPHY CHEST   CT ABDOMEN AND PELVIS WITH CONTRAST   TECHNIQUE: Multidetector CT imaging of the chest was performed using the standard protocol during bolus administration of intravenous contrast. Multiplanar CT image reconstructions and MIPs were obtained to evaluate the vascular anatomy. Multidetector CT imaging of the abdomen and pelvis was performed using the standard protocol during bolus administration of intravenous contrast.   RADIATION DOSE REDUCTION: This exam was performed according to the departmental dose-optimization program which includes automated exposure control, adjustment of the mA and/or kV according to patient size and/or use of iterative reconstruction technique.   CONTRAST:  75mL OMNIPAQUE IOHEXOL 350 MG/ML SOLN   COMPARISON:  None Available.   FINDINGS: CT CHEST ANGIOGRAM FINDINGS   Cardiovascular: Satisfactory opacification of the pulmonary arteries to the segmental level. No evidence of pulmonary embolism. Normal heart size. Three-vessel coronary artery calcifications. Enlargement of the main pulmonary artery measuring up to 3.6 cm in caliber. No pericardial effusion. Severe aortic atherosclerosis.   Mediastinum/Nodes: Enlarged right cardiophrenic angle lymph nodes measuring up to 1.4 x 1.1 cm (series 2, image 16). No other enlarged mediastinal, hilar, or axillary lymph nodes. Thyroid gland, trachea, and esophagus demonstrate  no significant findings.   Lungs/Pleura: Diffuse bilateral bronchial wall thickening. Extensive clustered nodular airspace opacity throughout the lungs, particularly in the posterior upper lobes (series 6, image 45) although with several more discrete nodular appearing opacities throughout the lungs, for example in the peripheral anterior right lower lobe measuring 1.1 x 0.9 cm (series 6, image 98). No pleural  effusion or pneumothorax.   Musculoskeletal: No chest wall abnormality. No acute osseous findings.   Review of the MIP images confirms the above findings.   CT ABDOMEN PELVIS FINDINGS   Hepatobiliary: Large, rim enhancing mass in the left lobe of the liver, centered in hepatic segment IV, measuring 5.7 x 4.8 cm with associated segmental biliary ductal obstruction and atrophy of the left lobe (series 2, image 21). No gallstones, gallbladder wall thickening, or extrahepatic biliary dilatation.   Pancreas: Unremarkable. No pancreatic ductal dilatation or surrounding inflammatory changes.   Spleen: Normal in size without significant abnormality.   Adrenals/Urinary Tract: Adrenal glands are unremarkable. Simple, benign bilateral renal cortical cysts. Kidneys are otherwise normal, without renal calculi, solid lesion, or hydronephrosis. Bladder is unremarkable.   Stomach/Bowel: Stomach is within normal limits. Appendix appears normal. No evidence of bowel wall thickening, distention, or inflammatory changes. Descending and sigmoid diverticulosis.   Vascular/Lymphatic: Severe aortic atherosclerosis. No enlarged abdominal or pelvic lymph nodes.   Reproductive: No mass or other significant abnormality.   Other: No abdominal wall hernia or abnormality. Small volume perihepatic ascites.   Musculoskeletal: No acute or significant osseous findings.   IMPRESSION: 1. Negative examination for pulmonary embolism. 2. Large, rim enhancing mass in the left lobe of the liver,  centered in hepatic segment IV, measuring 5.7 x 4.8 cm with associated segmental biliary ductal obstruction and atrophy of the left lobe. 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 3. Enlarged right cardiophrenic angle lymph nodes, highly suspicious for nodal metastatic disease. 4. Small volume perihepatic ascites. 5. Extensive clustered nodular airspace opacity throughout the lungs, particularly in the posterior upper lobes, with several more discrete nodular appearing opacities throughout the lungs. There is some component of atypical infection, however small pulmonary metastases amongst these airspace opacities very difficult to confidently exclude. 6. Diffuse bilateral bronchial wall thickening, consistent with nonspecific infectious or inflammatory bronchitis. 7. Enlargement of the main pulmonary artery, as can be seen in pulmonary hypertension. 8. Coronary artery disease.   Aortic Atherosclerosis (ICD10-I70.0).     Electronically Signed   By: Marolyn JONETTA Jaksch M.D.   On: 06/06/2024 10:17    ASSESSMENT & PLAN:  Patient is a 83 y.o. female presenting for  newly diagnosed possible metastatic cholangiocarcinoma  Assessment and Plan Assessment & Plan Cholangiocarcinoma Cholangiocarcinoma diagnosed via liver biopsy, originating from the bile duct with primary liver mass.   -PET scan needed for assessment of the extent of disease.  We reviewed the CT scan together patient has cardiophrenic lymph node that is enlarged along with lung nodules that are likely consistent with metastatic disease but infectious cause could not be ruled out. - Discussed and poor prognosis with median survival over one year with treatment if proven to be metastatic.  - [facility] NGS pending at this time for potential targeted therapy. - Discussed the standard of care  chemoimmunotherapy for metastatic cholangiocarcinoma.The combination of gemcitabine, cisplatin, and durvalumab (GemCisDurva) has become a new first-line standard for metastatic cholangiocarcinoma based on the TOPAZ-1 trial, which showed an improved median overall survival (OS) of 12.8 months versus 11.5 months with GemCis alone (HR 0.80).  -The benefits include a modest but statistically significant survival advantage and durable responses in a subset of patients due to immune activation from durvalumab. The risks include added immune-related adverse events (e.g., pneumonitis, colitis, hepatitis) on top of chemotherapy-related toxicities like nephrotoxicity, myelosuppression, and fatigue.   Return to  clinic after PET scan to discuss results and further management.   Goals of Care Discussed treatment options and prognosis, emphasizing quality of life over aggressive treatment. Informed her of limited overall survival benefit of treatment options especially considering her age and comorbidities.  Decision-making will involve her values and preferences, especially considering her age and potential treatment side effects.  - Will engage in shared decision-making regarding treatment options after PET scan results. - Will discuss quality of life considerations in treatment planning.  A-fib Patient recently admitted for A-fib with RVR  - Continue Eliquis  5 mg twice daily     Orders Placed This Encounter  Procedures   NM PET Image Initial (PI) Skull Base To Thigh    Standing Status:   Future    Expected Date:   07/07/2024    Expiration Date:   06/28/2025    If indicated for the ordered procedure, I authorize the administration of a radiopharmaceutical per Radiology protocol:   Yes    Preferred imaging location?:   [facility]    Release to patient:   Immediate    The total time spent in the appointment was 40 minutes encounter with patients including review of chart and various tests results,  discussions about plan of care and coordination of care plan   All questions were answered. The patient knows to call the clinic with any problems, questions or concerns. No barriers to learning was detected.  Mickiel Dry, MD 11/5/20259:14 PM

## 2024-07-07 ENCOUNTER — Encounter (HOSPITAL_COMMUNITY): Payer: Self-pay

## 2024-07-07 ENCOUNTER — Ambulatory Visit (HOSPITAL_COMMUNITY)
Admission: RE | Admit: 2024-07-07 | Discharge: 2024-07-07 | Disposition: A | Discharge status: Home / Self Care | Source: Ambulatory Visit | Attending: Oncology | Admitting: Oncology

## 2024-07-07 ENCOUNTER — Telehealth: Payer: Self-pay | Admitting: Hematology

## 2024-07-07 DIAGNOSIS — C221 Intrahepatic bile duct carcinoma: Secondary | ICD-10-CM | POA: Diagnosis present

## 2024-07-07 MED ORDER — FLUDEOXYGLUCOSE F - 18 (FDG) INJECTION
10.0000 | Freq: Once | INTRAVENOUS | Status: AC | PRN
  Administered 2024-07-07: 7.31 via INTRAVENOUS

## 2024-07-07 NOTE — Telephone Encounter (Signed)
 Called  the pt  regarding her appts and time and pt is aware of her appt.

## 2024-07-13 ENCOUNTER — Other Ambulatory Visit: Payer: Self-pay

## 2024-07-13 ENCOUNTER — Telehealth: Payer: Self-pay

## 2024-07-13 NOTE — Progress Notes (Signed)
 The proposed treatment discussed in conference is for discussion purpose only and is not a binding recommendation.  The patients have not been physically examined, or presented with their treatment options.  Therefore, final treatment plans cannot be decided.

## 2024-07-13 NOTE — Telephone Encounter (Signed)
 GI Tumor Boards  Gina Duncan was presented on the GI tumor board today. she is a candidate for genetic testing.  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 Message was sent to Dr. Lanny, as I wasn't able to review this patient before tumor boards.   Gina Ahle, MS, [facility] Cancer Genetic Counselor [email or web address] [phone]

## 2024-07-15 ENCOUNTER — Inpatient Hospital Stay: Admitting: Oncology

## 2024-07-19 ENCOUNTER — Encounter: Payer: Self-pay | Admitting: Hematology

## 2024-07-19 ENCOUNTER — Inpatient Hospital Stay: Attending: Hematology | Admitting: Hematology

## 2024-07-19 ENCOUNTER — Inpatient Hospital Stay

## 2024-07-19 VITALS — BP 160/76

## 2024-07-19 DIAGNOSIS — Z87891 Personal history of nicotine dependence: Secondary | ICD-10-CM | POA: Diagnosis not present

## 2024-07-19 DIAGNOSIS — C221 Intrahepatic bile duct carcinoma: Secondary | ICD-10-CM | POA: Insufficient documentation

## 2024-07-19 DIAGNOSIS — Z803 Family history of malignant neoplasm of breast: Secondary | ICD-10-CM | POA: Insufficient documentation

## 2024-07-19 NOTE — Progress Notes (Signed)
 [facility]   [phone]) [phone] Fax:([phone]) [phone]   Clinic New Consult Note   Patient Care Team: [name], MD as PCP - General (Internal Medicine) [name] (General Practice) [name], MD as Medical Oncologist (Medical Oncology) [name], RN as Oncology Nurse Navigator (Medical Oncology) 07/19/2024  CHIEF COMPLAINTS/PURPOSE OF CONSULTATION:  Newly diagnosed intrahepatic cholangiocarcinoma  REFERRING PHYSICIAN: Self-referral  Discussed the use of AI scribe software for clinical note transcription with the patient, who gave verbal consent to proceed.  History of Present Illness Gina Duncan is an 83 year old female who presents for a consultation regarding cancer treatment options. She is accompanied by her daughter, son and husband.  She was seen by my partner Dr. Davonna in [place] on 06/28/2024, and she requested a second opinion and self-referred to me.  Patient presented to hospital on June 06, 2024 with cough and chest congestion, she also had mild abdominal discomfort and nausea at that time.  She was admitted for pneumonia and rapid A-fib.  During the hospital admission, CT scan was obtained a 5.7 x 4.8 cm large rim-enhancing mass in the left lobe of liver, with enlarged right cardiophrenic angle node, concerning for malignancy.  This was further evaluated by MRI which showed similar pictures.  She underwent liver biopsy by IR in the hospital, which showed moderately differentiated adenocarcinoma, positive for CK7 and CK20, negative for CDX2 and TTF-1.  Based on the imaging and immunostain results, this is most consistent with intrahepatic cholangiocarcinoma.  Patient recovered completely after hospital discharge, denies any abdominal discomfort, nausea, or other symptoms.  She lives with her husband, her daughter and son live close to them.  She is fully functioning at home, stays active with yard work etc.  Patient  was seen by Dr. Davonna, a PET scan was subsequently obtained after the visit.  Tumor NGS Caris was also done.     MEDICAL HISTORY:  Past Medical History:  Diagnosis Date   CAD (coronary artery disease)    DES to LAD 2017   Endometrial cancer (HCC)    Hypertension    Radiation 06/01/15, 06/12/15, 06/15/15, 06/19/15, 06/21/15   proximal vagina 30 gray    SURGICAL HISTORY: Past Surgical History:  Procedure Laterality Date   ABDOMINAL HYSTERECTOMY     cardiac stents  2017   CARDIOVERSION N/A 04/21/2022   Procedure: CARDIOVERSION;  Surgeon: Shlomo Wilbert SAUNDERS, MD;  Location: [facility] ENDOSCOPY;  Service: Cardiovascular;  Laterality: N/A;   CHOLECYSTECTOMY     ROBOTIC ASSISTED TOTAL HYSTERECTOMY WITH BILATERAL SALPINGO OOPHERECTOMY  02/13/2015   by Dr. Arlee   TEE WITHOUT CARDIOVERSION N/A 04/21/2022   Procedure: TRANSESOPHAGEAL ECHOCARDIOGRAM (TEE);  Surgeon: Shlomo Wilbert SAUNDERS, MD;  Location: [facility] ENDOSCOPY;  Service: Cardiovascular;  Laterality: N/A;    SOCIAL HISTORY: Social History   Socioeconomic History   Marital status: Married    Spouse name: Not on file   Number of children: 2   Years of education: Not on file   Highest education level: Not on file  Occupational History   Not on file  Tobacco Use   Smoking status: Former    Types: Cigarettes   Smokeless tobacco: Former  Substance and Sexual Activity   Alcohol use: No    Alcohol/week: 0.0 standard drinks of alcohol   Drug use: No   Sexual activity: Not on file  Other Topics Concern   Not on file  Social History Narrative   Married, 2 children  and 8 grand and 5 great   Social Drivers of Corporate Investment Banker Strain: Not on file  Food Insecurity: No Food Insecurity (06/06/2024)   Hunger Vital Sign    Worried About Running Out of Food in the Last Year: Never true    Ran Out of Food in the Last Year: Never true  Transportation Needs: No Transportation Needs (06/06/2024)   PRAPARE - Scientist, Research (physical Sciences) (Medical): No    Lack of Transportation (Non-Medical): No  Physical Activity: Not on file  Stress: Not on file  Social Connections: Socially Integrated (06/06/2024)   Social Connection and Isolation Panel    Frequency of Communication with Friends and Family: More than three times a week    Frequency of Social Gatherings with Friends and Family: More than three times a week    Attends Religious Services: More than 4 times per year    Active Member of [employer] or Organizations: Yes    Attends Engineer, Structural: More than 4 times per year    Marital Status: Married  Catering Manager Violence: Not At Risk (06/06/2024)   Humiliation, Afraid, Rape, and Kick questionnaire    Fear of Current or Ex-Partner: No    Emotionally Abused: No    Physically Abused: No    Sexually Abused: No    FAMILY HISTORY: Family History  Problem Relation Age of Onset   Diabetes Mother    Peripheral vascular disease Mother    Stroke Father    Breast cancer Daughter     ALLERGIES:  has no known allergies.  MEDICATIONS:  Current Outpatient Medications  Medication Sig Dispense Refill   acetaminophen  (TYLENOL ) 500 MG tablet Take 500 mg by mouth every 6 (six) hours as needed for moderate pain (pain score 4-6).     amiodarone (PACERONE) 200 MG tablet Take 1 tablet (200 mg total) by mouth daily. Take amiodarone 200 mg p.o. twice daily for 3 weeks starting from 06/08/2024 till 06/29/2024.  Then start taking amiodarone 200 mg p.o. daily 90 tablet 1   apixaban  (ELIQUIS ) 5 MG TABS tablet Take 1 tablet (5 mg total) by mouth 2 (two) times daily. 180 tablet 3   hydrochlorothiazide (HYDRODIURIL) 12.5 MG tablet Take 12.5 mg by mouth.     losartan  (COZAAR ) 25 MG tablet Take 1 tablet (25 mg total) by mouth daily. 30 tablet 2   metoprolol  tartrate (LOPRESSOR ) 25 MG tablet Take 1 tablet (25 mg total) by mouth 2 (two) times daily. 60 tablet 11   omeprazole (PRILOSEC OTC) 20 MG tablet Take 20 mg by mouth  daily as needed (for heartburn).     rosuvastatin  (CRESTOR ) 5 MG tablet Take 1 tablet by mouth once daily 90 tablet 1   No current facility-administered medications for this visit.    REVIEW OF SYSTEMS:   Constitutional: Denies fevers, chills or abnormal night sweats Eyes: Denies blurriness of vision, double vision or watery eyes Ears, nose, mouth, throat, and face: Denies mucositis or sore throat Respiratory: Denies cough, dyspnea or wheezes Cardiovascular: Denies palpitation, chest discomfort or lower extremity swelling Gastrointestinal:  Denies nausea, heartburn or change in bowel habits Skin: Denies abnormal skin rashes Lymphatics: Denies new lymphadenopathy or easy bruising Neurological:Denies numbness, tingling or new weaknesses Behavioral/Psych: Mood is stable, no new changes  All other systems were reviewed with the patient and are negative.  PHYSICAL EXAMINATION: ECOG PERFORMANCE STATUS: 0 - Asymptomatic  Vitals:   07/19/24 1114  BP: (!) 160/76  There were no vitals filed for this visit.  GENERAL:alert, no distress and comfortable SKIN: skin color, texture, turgor are normal, no rashes or significant lesions EYES: normal, conjunctiva are pink and non-injected, sclera clear OROPHARYNX:no exudate, no erythema and lips, buccal mucosa, and tongue normal  NECK: supple, thyroid normal size, non-tender, without nodularity LYMPH:  no palpable lymphadenopathy in the cervical, axillary or inguinal LUNGS: clear to auscultation and percussion with normal breathing effort HEART: regular rate & rhythm and no murmurs and no lower extremity edema ABDOMEN:abdomen soft, non-tender and normal bowel sounds Musculoskeletal:no cyanosis of digits and no clubbing  PSYCH: alert & oriented x 3 with fluent speech NEURO: no focal motor/sensory deficits  Physical Exam MEASUREMENTS: Weight- 131. ABDOMEN: Liver not palpable  LABORATORY DATA:  I have reviewed the data as listed    Latest  Ref Rng & Units 06/12/2024    9:01 AM 06/11/2024    7:11 AM 06/10/2024    5:17 AM  CBC  WBC 4.0 - 10.5 K/uL 7.9  8.0  6.8   Hemoglobin 12.0 - 15.0 g/dL 87.7  87.5  87.6   Hematocrit 36.0 - 46.0 % 38.4  38.8  38.1   Platelets 150 - 400 K/uL 321  313  266     @cmpl @  RADIOGRAPHIC STUDIES: I have personally reviewed the radiological images as listed and agreed with the findings in the report. NM PET Image Initial (PI) Skull Base To Thigh Result Date: 07/11/2024 CLINICAL DATA:  Initial treatment strategy for cholangiocarcinoma. EXAM: NUCLEAR MEDICINE PET SKULL BASE TO THIGH TECHNIQUE: 7.3 mCi F-18 FDG was injected intravenously. Full-ring PET imaging was performed from the skull base to thigh after the radiotracer. CT data was obtained and used for attenuation correction and anatomic localization. Fasting blood glucose: 123 mg/dl COMPARISON:  MRI 89/10/7972 FINDINGS: Mediastinal blood pool activity: SUV max 2.8 Liver activity: SUV max NA NECK: No significant abnormal hypermetabolic activity in this region. Incidental CT findings: Chronic right maxillary sinus filling most of the maxillary sinus. Bilateral common carotid atheromatous vascular calcifications. CHEST: No significant abnormal hypermetabolic activity in this region. Incidental CT findings: Coronary, aortic arch, and branch vessel atherosclerotic vascular disease. Clustered pericardial lymph nodes measuring up to 0.8 cm in short axis on image 81 series 202 are not hypermetabolic. Reticulonodular opacities in the upper lobes favoring atypical infectious bronchiolitis. ABDOMEN/PELVIS: Central left hepatic lobe hypermetabolic mass measuring about 5.2 cm in long axis with maximum SUV 4.8, compatible with malignancy. Prominent left hepatic lobe biliary dilatation associated with this mass. Along the right margin of the left hepatic lobe a separate 1 cm focus of hypermetabolic activity is visible for example on fused image 82, with maximum SUV of  4.2, suspicious for a second focus of malignancy. Faint activity along the vicinity of the gallbladder fossa with maximum SUV 4.0. Focus of accentuated activity in the rectosigmoid junction region with maximum SUV 6.7, physiologic versus polyp. Incidental CT findings: Atherosclerosis is present, including aortoiliac atherosclerotic disease. Substantial atheromatous vascular calcification proximally in the SMA and celiac trunk. Photopenic benign right renal cysts warrant no further imaging workup. Sigmoid colon diverticulosis. SKELETON: No significant abnormal hypermetabolic activity in this region. Incidental CT findings: Dextroconvex lumbar scoliosis with rotary component. IMPRESSION: 1. Hypermetabolic central left hepatic lobe mass compatible with malignancy. There is a separate 1 cm focus of hypermetabolic activity along the right margin of the left hepatic lobe suspicious for a second focus of malignancy. 2. Faint activity along the vicinity of the gallbladder fossa,  nonspecific. 3. Focus of accentuated activity in the rectosigmoid junction region, physiologic versus polyp. 4. Reticulonodular opacities in the upper lobes favoring atypical infectious bronchiolitis. 5. Chronic right maxillary sinus filling. 6. Dextroconvex lumbar scoliosis with rotary component. 7.  Aortic Atherosclerosis (ICD10-I70.0). Electronically Signed   By: Ryan Salvage M.D.   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 -I personally reviewed her images with the patient and her family, and discussed the diagnosis, prognosis and treatment options with them in detail.   - Case as reviewed in our tumor board, unfortunately surgery is not an option due to the extensive disease and her age.   -Chemotherapy and local therapy (Y90 or radiation) are primary treatment options. Chemotherapy offers systemic  treatment, potentially shrinking tumors in the liver and lymph nodes. Y90 and radiation are liver directed therapy.  I explained the procedures to them in detail.  - Although she is 83 year old, she is in good health overall, I think it is okay to try gemcitabine, cisplatin and durvalumab every 2 weeks, or single agent chemo gemcitabine plus immunotherapy if she does not tolerate combination chemotherapy.  Benefit and and potential side effect of chemotherapy were discussed with patient and her family in great detail. -After lengthy discussion, patient wants to take some time to think about options, and get back to us  next week. - Will monitor for jaundice and abnormal liver function.   Plan - I personally reviewed her images, biopsy results, and lab and discussed the findings with patient and her family in details. - We discussed that her cancer is unlikely resectable, but I will check with pancreatobiliary surgeon at [facility] - I recommend systemic treatment with cisplatin, gemcitabine and immunotherapy durvalumab or Keytruda.  If she does not tolerate it well, we will drop cisplatin - I also discussed option of liver targeted therapy, including Y90 and radiation therapy -pt wants to think about these options and let me know her decision next week.     No orders of the defined types were placed in this encounter.   All questions were answered. The patient knows to call the clinic with any problems, questions or concerns. I spent 55 minutes counseling the patient face to face. The total time spent in the appointment was 60 minutes including review of chart and various tests results, discussions about plan of care and coordination of care plan.     Onita Mattock, MD 07/19/2024 12:41 PM

## 2024-07-19 NOTE — Progress Notes (Signed)
 PATIENT NAVIGATOR PROGRESS NOTE  Name: Gina Duncan Date: 07/19/2024 MRN: [id number]  DOB: 04-27-1941   Reason for visit:  Initial Med/Onc Appointment with Dr. Onita Mattock  Comments:   Patient was seen during her initial appointment with Dr. Mattock.  Patient was accompanied by her husband, daughter, and son.  Patient and her children were given my direct contact information.  Patient wanted to take some time to think about her treatment options before making a final decision.  Plan is for our office to reach out to patient on Monday 12/1 to check on patient's decision status.  Patient knows she can call office prior to 12/1 if she has made a decision.  Patient verbalized understanding.    Time spent counseling/coordinating care: > 60 minutes

## 2024-07-22 ENCOUNTER — Other Ambulatory Visit: Payer: Self-pay | Admitting: Hematology

## 2024-07-22 DIAGNOSIS — R160 Hepatomegaly, not elsewhere classified: Secondary | ICD-10-CM

## 2024-07-22 DIAGNOSIS — C221 Intrahepatic bile duct carcinoma: Secondary | ICD-10-CM

## 2024-07-22 MED ORDER — ONDANSETRON HCL 8 MG PO TABS: 8.0000 mg | ORAL_TABLET | Freq: Three times a day (TID) | ORAL | 1 refills | Status: AC | PRN

## 2024-07-22 MED ORDER — PROCHLORPERAZINE MALEATE 10 MG PO TABS: 10.0000 mg | ORAL_TABLET | Freq: Four times a day (QID) | ORAL | 1 refills | Status: AC | PRN

## 2024-07-22 MED ORDER — LIDOCAINE-PRILOCAINE 2.5-2.5 % EX CREA: TOPICAL_CREAM | CUTANEOUS | 3 refills | Status: AC

## 2024-07-22 NOTE — Progress Notes (Signed)
 START ON PATHWAY REGIMEN - Hepatobiliary     Cycles 1 through up to 8: A cycle is every 21 days:     Durvalumab      Gemcitabine      Cisplatin    Cycles 9 and beyond: A cycle is every 28 days:     Durvalumab   **Always confirm dose/schedule in your pharmacy ordering system**  Patient Characteristics: Cholangiocarcinoma, Unresectable/Metastatic, Systemic Therapy, First Line Hepatobiliary Disease Type: Cholangiocarcinoma Line of therapy: First Line Intent of Therapy: Non-Curative / Palliative Intent, Discussed with Patient

## 2024-07-22 NOTE — Progress Notes (Signed)
 PATIENT NAVIGATOR PROGRESS NOTE  Name: Gina Duncan Date: 07/22/2024 MRN: [id number]  DOB: 05/25/1941   Reason for visit:  Phone call follow-up  Comments:   Called and spoke to patient to update her on treatment options.  Dr. Lanny had discussed patient's case to evaluate for surgical eligibility.  Based on their assessment, patient is not considered a surgical candidate at this time. Recommendation is to proceed with initial chemotherapy and repeat imaging in 3 months to reassess for surgical eligibility. Patient was informed of these recommendations and agreed to proceed with chemotherapy.  Dr. Lanny was notified of patients response.  Orders for port placement were entered today.  Patient was advised that she would receive calls from our scheduling team to arrange for port placement, chemo education, and initial chemo session.  Patient was instructed to contact office with any questions or concerns.  Patient verbalized understanding and agreement to all.     Time spent counseling/coordinating care: > 60 minutes

## 2024-07-24 ENCOUNTER — Other Ambulatory Visit: Payer: Self-pay

## 2024-07-25 ENCOUNTER — Telehealth: Payer: Self-pay | Admitting: Hematology

## 2024-07-25 ENCOUNTER — Other Ambulatory Visit: Payer: Self-pay

## 2024-07-25 NOTE — Telephone Encounter (Signed)
 Called pt  to scheduled her appt and upcoming appt and she  is aware.

## 2024-08-01 ENCOUNTER — Other Ambulatory Visit: Payer: Self-pay | Admitting: Interventional Radiology

## 2024-08-01 ENCOUNTER — Telehealth: Payer: Self-pay | Admitting: *Deleted

## 2024-08-01 ENCOUNTER — Inpatient Hospital Stay: Attending: Hematology

## 2024-08-01 DIAGNOSIS — R946 Abnormal results of thyroid function studies: Secondary | ICD-10-CM | POA: Insufficient documentation

## 2024-08-01 DIAGNOSIS — C221 Intrahepatic bile duct carcinoma: Secondary | ICD-10-CM | POA: Insufficient documentation

## 2024-08-01 DIAGNOSIS — Z5111 Encounter for antineoplastic chemotherapy: Secondary | ICD-10-CM | POA: Insufficient documentation

## 2024-08-01 DIAGNOSIS — Z5112 Encounter for antineoplastic immunotherapy: Secondary | ICD-10-CM | POA: Insufficient documentation

## 2024-08-01 DIAGNOSIS — Z79620 Long term (current) use of immunosuppressive biologic: Secondary | ICD-10-CM | POA: Insufficient documentation

## 2024-08-01 NOTE — H&P (Signed)
 Chief Complaint: Newly diagnosed intrahepatic cholangiocarcinoma ; referred for port a cath placement to assist with treatment  Referring Provider(s): Feng,Y  Supervising Physician: Jennefer Rover  Patient Status: [facility] - Out-pt  History of Present Illness: Gina Duncan is an 83 y.o. 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 She is scheduled today for port a cath placement to assist with treatment.     *** Patient is Full Code  Past Medical History:  Diagnosis Date   CAD (coronary artery disease)    DES to LAD 2017   Endometrial cancer (HCC)    Hypertension    Radiation 06/01/15, 06/12/15, 06/15/15, 06/19/15, 06/21/15   proximal vagina 30 gray    Past Surgical History:  Procedure Laterality Date   ABDOMINAL HYSTERECTOMY     cardiac stents  2017   CARDIOVERSION N/A 04/21/2022   Procedure: CARDIOVERSION;  Surgeon: Shlomo Wilbert SAUNDERS, MD;  Location: [facility] ENDOSCOPY;  Service: Cardiovascular;  Laterality: N/A;   CHOLECYSTECTOMY     ROBOTIC ASSISTED TOTAL HYSTERECTOMY WITH BILATERAL SALPINGO OOPHERECTOMY  02/13/2015   by Dr. Arlee   TEE WITHOUT CARDIOVERSION N/A 04/21/2022   Procedure: TRANSESOPHAGEAL ECHOCARDIOGRAM (TEE);  Surgeon: Shlomo Wilbert SAUNDERS, MD;  Location: [facility] ENDOSCOPY;  Service: Cardiovascular;  Laterality: N/A;    Allergies: Patient has no known allergies.  Medications: Prior to Admission medications   Medication Sig Start Date End Date Taking? Authorizing Provider  acetaminophen  (TYLENOL ) 500 MG tablet Take 500 mg by mouth every 6 (six) hours as needed for moderate pain (pain score 4-6).    [provider]  amiodarone  (PACERONE ) 200 MG tablet Take 1 tablet (200 mg total) by mouth daily. Take amiodarone  200 mg p.o. twice daily for 3 weeks  starting from 06/08/2024 till 06/29/2024.  Then start taking amiodarone  200 mg p.o. daily 06/13/24 12/10/24  Drusilla Sabas RAMAN, MD  apixaban  (ELIQUIS ) 5 MG TABS tablet Take 1 tablet (5 mg total) by mouth 2 (two) times daily. 02/24/24   Lavona Agent, MD  hydrochlorothiazide (HYDRODIURIL) 12.5 MG tablet Take 12.5 mg by mouth. 06/06/24   [provider]  lidocaine -prilocaine  (EMLA ) cream Apply to affected area once 07/22/24   Lanny Callander, MD  losartan  (COZAAR ) 25 MG tablet Take 1 tablet (25 mg total) by mouth daily. 06/14/24   Drusilla Sabas RAMAN, MD  metoprolol  tartrate (LOPRESSOR ) 25 MG tablet Take 1 tablet (25 mg total) by mouth 2 (two) times daily. 10/29/23   Lavona Agent, MD  omeprazole (PRILOSEC OTC) 20 MG tablet Take 20 mg by mouth daily as needed (for heartburn).    [provider]  ondansetron  (ZOFRAN ) 8 MG tablet Take 1 tablet (8 mg total) by mouth every 8 (eight) hours as needed for nausea or vomiting. Start on the third day after cisplatin. 07/22/24   Lanny Callander, MD  prochlorperazine  (COMPAZINE ) 10 MG tablet Take 1 tablet (10 mg total) by mouth every 6 (six) hours as needed (Nausea or vomiting). 07/22/24   Lanny Callander, MD  rosuvastatin  (CRESTOR ) 5 MG tablet Take 1 tablet by mouth once daily 04/20/24   Lavona Agent, MD     Family History  Problem Relation Age of Onset   Diabetes Mother    Peripheral vascular disease Mother    Stroke Father    Breast cancer Daughter  Social History   Socioeconomic History   Marital status: Married    Spouse name: Not on file   Number of children: 2   Years of education: Not on file   Highest education level: Not on file  Occupational History   Not on file  Tobacco Use   Smoking status: Former    Types: Cigarettes   Smokeless tobacco: Former  Substance and Sexual Activity   Alcohol use: No    Alcohol/week: 0.0 standard drinks of alcohol   Drug use: No   Sexual activity: Not on file  Other Topics Concern   Not on file  Social  History Narrative   Married, 2 children and 8 grand and 5 great   Social Drivers of Corporate Investment Banker Strain: Not on file  Food Insecurity: No Food Insecurity (06/06/2024)   Hunger Vital Sign    Worried About Running Out of Food in the Last Year: Never true    Ran Out of Food in the Last Year: Never true  Transportation Needs: No Transportation Needs (06/06/2024)   PRAPARE - Administrator, Civil Service (Medical): No    Lack of Transportation (Non-Medical): No  Physical Activity: Not on file  Stress: Not on file  Social Connections: Socially Integrated (06/06/2024)   Social Connection and Isolation Panel    Frequency of Communication with Friends and Family: More than three times a week    Frequency of Social Gatherings with Friends and Family: More than three times a week    Attends Religious Services: More than 4 times per year    Active Member of [employer] or Organizations: Yes    Attends Engineer, Structural: More than 4 times per year    Marital Status: Married       Review of Systems  Vital Signs:   Advance Care Plan: no documents on file   Physical Exam  Imaging: NM PET Image Initial (PI) Skull Base To Thigh Result Date: 07/11/2024 CLINICAL DATA:  Initial treatment strategy for cholangiocarcinoma. EXAM: NUCLEAR MEDICINE PET SKULL BASE TO THIGH TECHNIQUE: 7.3 mCi F-18 FDG was injected intravenously. Full-ring PET imaging was performed from the skull base to thigh after the radiotracer. CT data was obtained and used for attenuation correction and anatomic localization. Fasting blood glucose: 123 mg/dl COMPARISON:  MRI 89/10/7972 FINDINGS: Mediastinal blood pool activity: SUV max 2.8 Liver activity: SUV max NA NECK: No significant abnormal hypermetabolic activity in this region. Incidental CT findings: Chronic right maxillary sinus filling most of the maxillary sinus. Bilateral common carotid atheromatous vascular calcifications. CHEST: No  significant abnormal hypermetabolic activity in this region. Incidental CT findings: Coronary, aortic arch, and branch vessel atherosclerotic vascular disease. Clustered pericardial lymph nodes measuring up to 0.8 cm in short axis on image 81 series 202 are not hypermetabolic. Reticulonodular opacities in the upper lobes favoring atypical infectious bronchiolitis. ABDOMEN/PELVIS: Central left hepatic lobe hypermetabolic mass measuring about 5.2 cm in long axis with maximum SUV 4.8, compatible with malignancy. Prominent left hepatic lobe biliary dilatation associated with this mass. Along the right margin of the left hepatic lobe a separate 1 cm focus of hypermetabolic activity is visible for example on fused image 82, with maximum SUV of 4.2, suspicious for a second focus of malignancy. Faint activity along the vicinity of the gallbladder fossa with maximum SUV 4.0. Focus of accentuated activity in the rectosigmoid junction region with maximum SUV 6.7, physiologic versus polyp. Incidental CT findings: Atherosclerosis is present, including aortoiliac  atherosclerotic disease. Substantial atheromatous vascular calcification proximally in the SMA and celiac trunk. Photopenic benign right renal cysts warrant no further imaging workup. Sigmoid colon diverticulosis. SKELETON: No significant abnormal hypermetabolic activity in this region. Incidental CT findings: Dextroconvex lumbar scoliosis with rotary component. IMPRESSION: 1. Hypermetabolic central left hepatic lobe mass compatible with malignancy. There is a separate 1 cm focus of hypermetabolic activity along the right margin of the left hepatic lobe suspicious for a second focus of malignancy. 2. Faint activity along the vicinity of the gallbladder fossa, nonspecific. 3. Focus of accentuated activity in the rectosigmoid junction region, physiologic versus polyp. 4. Reticulonodular opacities in the upper lobes favoring atypical infectious bronchiolitis. 5. Chronic  right maxillary sinus filling. 6. Dextroconvex lumbar scoliosis with rotary component. 7.  Aortic Atherosclerosis (ICD10-I70.0). Electronically Signed   By: Ryan Salvage M.D.   On: 07/11/2024 17:18    Labs:  CBC: Recent Labs    06/09/24 0019 06/10/24 0517 06/11/24 0711 06/12/24 0901  WBC 6.9 6.8 8.0 7.9  HGB 11.4* 12.3 12.4 12.2  HCT 35.5* 38.1 38.8 38.4  PLT 232 266 313 321    COAGS: Recent Labs    06/06/24 0831 06/08/24 0635 06/09/24 0019 06/09/24 0736 06/11/24 2051 06/12/24 0534 06/12/24 1457 06/12/24 2339 06/13/24 0649  INR 1.5*  --  1.3*  --   --   --   --   --  1.1  APTT  --    < > 89*   < > 69* 43* 77* 100*  --    < > = values in this interval not displayed.    BMP: Recent Labs    06/10/24 0517 06/11/24 0711 06/12/24 0901 06/13/24 0649  NA 136 137 142 138  K 4.5 4.0 3.5 3.7  CL 95* 92* 90* 87*  CO2 31 35* 34* 36*  GLUCOSE 106* 97 137* 104*  BUN 7* 11 13 16   CALCIUM  8.4* 8.6* 8.5* 8.6*  CREATININE 0.80 0.85 0.86 0.85  GFRNONAA >60 >60 >60 >60    LIVER FUNCTION TESTS: Recent Labs    06/06/24 0834 06/07/24 0451 06/09/24 0019 06/10/24 0517  BILITOT 2.6* 1.5* 0.5 0.9  AST 23 18 22 17   ALT 9 7 6 10   ALKPHOS 89 76 79 61  PROT 7.8 6.2* 6.1* 6.1*  ALBUMIN 4.0 3.1* 2.9* 2.3*    TUMOR MARKERS: No results for input(s): AFPTM, CEA, CA199, CHROMGRNA in the last 8760 hours.  Assessment and Plan: 83 y.o. 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 She is scheduled today for port a cath placement to assist with treatment. Risks and benefits of image guided port-a-catheter placement was discussed with the patient including, but not limited to bleeding, infection, pneumothorax, or fibrin sheath development and need for additional procedures.  All of the  patient's questions were answered, patient is agreeable to proceed. Consent signed and in chart.  She is known to IR team from liver mass biopsy on 06/13/24   Thank you for allowing our service to participate in [place]. [place] 's care.  Electronically Signed: D. Franky Rakers, PA-C   08/01/2024, 2:59 PM      I spent a total of  20 minutes   in face to face in clinical consultation, greater than 50% of which was counseling/coordinating care for port a cath placement

## 2024-08-01 NOTE — Telephone Encounter (Signed)
 Called pt & reminded not to take eliquis  tonight or tomorrow per Dr Lanny & will need some labs tomorrow while here.  Told to listen for call from Kristina for labs tomorrow.  Pt expressed understanding.

## 2024-08-02 ENCOUNTER — Other Ambulatory Visit: Payer: Self-pay | Admitting: Hematology

## 2024-08-02 ENCOUNTER — Encounter (HOSPITAL_COMMUNITY): Payer: Self-pay

## 2024-08-02 ENCOUNTER — Inpatient Hospital Stay (HOSPITAL_COMMUNITY)
Admission: RE | Admit: 2024-08-02 | Discharge: 2024-08-02 | Disposition: A | Source: Ambulatory Visit | Attending: Hematology

## 2024-08-02 ENCOUNTER — Telehealth: Payer: Self-pay | Admitting: Hematology

## 2024-08-02 ENCOUNTER — Ambulatory Visit (HOSPITAL_COMMUNITY)
Admission: RE | Admit: 2024-08-02 | Discharge: 2024-08-02 | Disposition: A | Source: Ambulatory Visit | Attending: Hematology

## 2024-08-02 ENCOUNTER — Other Ambulatory Visit: Payer: Self-pay

## 2024-08-02 DIAGNOSIS — Z8542 Personal history of malignant neoplasm of other parts of uterus: Secondary | ICD-10-CM | POA: Insufficient documentation

## 2024-08-02 DIAGNOSIS — Z87891 Personal history of nicotine dependence: Secondary | ICD-10-CM | POA: Insufficient documentation

## 2024-08-02 DIAGNOSIS — R160 Hepatomegaly, not elsewhere classified: Secondary | ICD-10-CM

## 2024-08-02 DIAGNOSIS — I480 Paroxysmal atrial fibrillation: Secondary | ICD-10-CM | POA: Insufficient documentation

## 2024-08-02 DIAGNOSIS — Z7901 Long term (current) use of anticoagulants: Secondary | ICD-10-CM | POA: Insufficient documentation

## 2024-08-02 DIAGNOSIS — F419 Anxiety disorder, unspecified: Secondary | ICD-10-CM | POA: Insufficient documentation

## 2024-08-02 DIAGNOSIS — I10 Essential (primary) hypertension: Secondary | ICD-10-CM | POA: Insufficient documentation

## 2024-08-02 DIAGNOSIS — Z79899 Other long term (current) drug therapy: Secondary | ICD-10-CM | POA: Insufficient documentation

## 2024-08-02 DIAGNOSIS — Z955 Presence of coronary angioplasty implant and graft: Secondary | ICD-10-CM | POA: Insufficient documentation

## 2024-08-02 DIAGNOSIS — C221 Intrahepatic bile duct carcinoma: Secondary | ICD-10-CM | POA: Insufficient documentation

## 2024-08-02 DIAGNOSIS — I2510 Atherosclerotic heart disease of native coronary artery without angina pectoris: Secondary | ICD-10-CM | POA: Insufficient documentation

## 2024-08-02 DIAGNOSIS — Z90710 Acquired absence of both cervix and uterus: Secondary | ICD-10-CM | POA: Insufficient documentation

## 2024-08-02 HISTORY — PX: IR IMAGING GUIDED PORT INSERTION: IMG5740

## 2024-08-02 MED ORDER — MIDAZOLAM HCL (PF) 2 MG/2ML IJ SOLN
INTRAMUSCULAR | Status: AC | PRN
  Administered 2024-08-02: 1 mg via INTRAVENOUS

## 2024-08-02 MED ORDER — MIDAZOLAM HCL 2 MG/2ML IJ SOLN
INTRAMUSCULAR | Status: AC
  Filled 2024-08-02: qty 2

## 2024-08-02 MED ORDER — HEPARIN SOD (PORK) LOCK FLUSH 100 UNIT/ML IV SOLN
INTRAVENOUS | Status: AC
  Filled 2024-08-02: qty 5

## 2024-08-02 MED ORDER — LIDOCAINE-EPINEPHRINE 1 %-1:100000 IJ SOLN
20.0000 mL | Freq: Once | INTRAMUSCULAR | Status: AC
  Administered 2024-08-02: 20 mL via INTRADERMAL

## 2024-08-02 MED ORDER — LIDOCAINE-EPINEPHRINE 1 %-1:100000 IJ SOLN
INTRAMUSCULAR | Status: AC
  Filled 2024-08-02: qty 1

## 2024-08-02 MED ORDER — HEPARIN SOD (PORK) LOCK FLUSH 100 UNIT/ML IV SOLN
500.0000 [IU] | Freq: Once | INTRAVENOUS | Status: AC
  Administered 2024-08-02: 500 [IU] via INTRAVENOUS

## 2024-08-02 MED ORDER — FENTANYL CITRATE (PF) 100 MCG/2ML IJ SOLN
INTRAMUSCULAR | Status: AC
  Filled 2024-08-02: qty 2

## 2024-08-02 MED ORDER — SODIUM CHLORIDE 0.9 % IV SOLN
INTRAVENOUS | Status: DC
  Administered 2024-08-02: 20 mL/h via INTRAVENOUS

## 2024-08-02 MED ORDER — FENTANYL CITRATE (PF) 100 MCG/2ML IJ SOLN
INTRAMUSCULAR | Status: AC | PRN
  Administered 2024-08-02 (×2): 50 ug via INTRAVENOUS

## 2024-08-02 NOTE — Discharge Instructions (Signed)
 Please refrain from taking a shower/bath today You may take a shower tomorrow and remove the dressing Do not apply any lotions or creams at the port insertion site until further notice Drink plenty of fluid to keep yourself hydrated

## 2024-08-02 NOTE — Procedures (Signed)
 Interventional Radiology Procedure Note  Procedure: Single Lumen Power Port Placement    Access:  Right internal jugular vein  Findings: Catheter tip positioned at cavoatrial junction. Port is ready for immediate use.   Complications: None  EBL: < 10 mL  Recommendations:  - Ok to shower in 24 hours - Do not submerge for 7 days - Routine line care    Ester Sides, MD

## 2024-08-02 NOTE — Telephone Encounter (Signed)
 Called the patient daughter and confirm time and date. Mail a letter

## 2024-08-02 NOTE — Progress Notes (Signed)
 Pharmacist Chemotherapy Monitoring - Initial Assessment    Anticipated start date: 08/09/24   The following has been reviewed per standard work regarding the patient's treatment regimen: The patient's diagnosis, treatment plan and drug doses, and organ/hematologic function Lab orders and baseline tests specific to treatment regimen  The treatment plan start date, drug sequencing, and pre-medications Prior authorization status  Patient's documented medication list, including drug-drug interaction screen and prescriptions for anti-emetics and supportive care specific to the treatment regimen The drug concentrations, fluid compatibility, administration routes, and timing of the medications to be used The patient's access for treatment and lifetime cumulative dose history, if applicable  The patient's medication allergies and previous infusion related reactions, if applicable   Changes made to treatment plan:  treatment plan date  Follow up needed:  Port placement scheduled for 12/2  Gina Duncan, RPH, 08/02/2024  11:00 AM

## 2024-08-03 ENCOUNTER — Other Ambulatory Visit: Payer: Self-pay | Admitting: Hematology

## 2024-08-03 ENCOUNTER — Other Ambulatory Visit: Payer: Self-pay

## 2024-08-03 ENCOUNTER — Other Ambulatory Visit: Payer: Self-pay | Admitting: Oncology

## 2024-08-03 DIAGNOSIS — C221 Intrahepatic bile duct carcinoma: Secondary | ICD-10-CM

## 2024-08-03 NOTE — Progress Notes (Signed)
 PATIENT NAVIGATOR PROGRESS NOTE  Name: Gina Duncan Date: 08/03/2024 MRN: [id number]  DOB: 15-Mar-1941  Lab appointment scheduled at [facility] location for 12/4 per patient's request.  CBC and CMP orders were modified for [facility] location.     Time spent counseling/coordinating care: 15-30 minutes

## 2024-08-04 ENCOUNTER — Inpatient Hospital Stay

## 2024-08-04 DIAGNOSIS — Z5111 Encounter for antineoplastic chemotherapy: Secondary | ICD-10-CM | POA: Diagnosis present

## 2024-08-04 DIAGNOSIS — Z79620 Long term (current) use of immunosuppressive biologic: Secondary | ICD-10-CM | POA: Diagnosis not present

## 2024-08-04 DIAGNOSIS — R946 Abnormal results of thyroid function studies: Secondary | ICD-10-CM | POA: Diagnosis not present

## 2024-08-04 DIAGNOSIS — C221 Intrahepatic bile duct carcinoma: Secondary | ICD-10-CM

## 2024-08-04 DIAGNOSIS — Z5112 Encounter for antineoplastic immunotherapy: Secondary | ICD-10-CM | POA: Diagnosis not present

## 2024-08-04 LAB — COMPREHENSIVE METABOLIC PANEL WITH GFR
ALT: 8 U/L (ref 0–44)
AST: 28 U/L (ref 15–41)
Albumin: 4.2 g/dL (ref 3.5–5.0)
Alkaline Phosphatase: 85 U/L (ref 38–126)
Anion gap: 13 (ref 5–15)
BUN: 23 mg/dL (ref 8–23)
CO2: 29 mmol/L (ref 22–32)
Calcium: 9.2 mg/dL (ref 8.9–10.3)
Chloride: 101 mmol/L (ref 98–111)
Creatinine, Ser: 1.1 mg/dL — ABNORMAL HIGH (ref 0.44–1.00)
GFR, Estimated: 51 mL/min — ABNORMAL LOW (ref >60)
Glucose, Bld: 97 mg/dL (ref 70–99)
Potassium: 3.9 mmol/L (ref 3.5–5.1)
Sodium: 142 mmol/L (ref 135–145)
Total Bilirubin: 1.4 mg/dL — ABNORMAL HIGH (ref 0.0–1.2)
Total Protein: 7.6 g/dL (ref 6.5–8.1)

## 2024-08-04 LAB — CBC WITH DIFFERENTIAL/PLATELET
Abs Immature Granulocytes: 0.03 K/uL (ref 0.00–0.07)
Basophils Absolute: 0.1 K/uL (ref 0.0–0.1)
Basophils Relative: 1 %
Eosinophils Absolute: 0 K/uL (ref 0.0–0.5)
Eosinophils Relative: 0 %
HCT: 44.3 % (ref 36.0–46.0)
Hemoglobin: 13.7 g/dL (ref 12.0–15.0)
Immature Granulocytes: 0 %
Lymphocytes Relative: 22 %
Lymphs Abs: 1.8 K/uL (ref 0.7–4.0)
MCH: 30.7 pg (ref 26.0–34.0)
MCHC: 30.9 g/dL (ref 30.0–36.0)
MCV: 99.3 fL (ref 80.0–100.0)
Monocytes Absolute: 0.8 K/uL (ref 0.1–1.0)
Monocytes Relative: 10 %
Neutro Abs: 5.4 K/uL (ref 1.7–7.7)
Neutrophils Relative %: 67 %
Platelets: 248 K/uL (ref 150–400)
RBC: 4.46 MIL/uL (ref 3.87–5.11)
RDW: 13.7 % (ref 11.5–15.5)
WBC: 8.1 K/uL (ref 4.0–10.5)
nRBC: 0 % (ref 0.0–0.2)

## 2024-08-04 LAB — TSH: TSH: 8.29 u[IU]/mL — ABNORMAL HIGH (ref 0.350–4.500)

## 2024-08-04 LAB — T4, FREE: Free T4: 0.97 ng/dL (ref 0.61–1.12)

## 2024-08-04 LAB — MAGNESIUM: Magnesium: 1.7 mg/dL (ref 1.7–2.4)

## 2024-08-05 ENCOUNTER — Encounter: Payer: Self-pay | Admitting: Cardiology

## 2024-08-05 MED ORDER — AMIODARONE HCL 200 MG PO TABS: 200.0000 mg | ORAL_TABLET | Freq: Every day | ORAL | 0 refills | Status: DC

## 2024-08-08 ENCOUNTER — Inpatient Hospital Stay

## 2024-08-08 NOTE — Assessment & Plan Note (Signed)
 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 -Dr. Lanny reviewed images with the patient and her family, and discussed the diagnosis, prognosis and treatment options with them in detail.   - Case was reviewed in our tumor board, unfortunately surgery is not an option due to the extensive disease and her age.   -Chemotherapy and local therapy (Y90 or radiation) are primary treatment options. Chemotherapy offers systemic treatment, potentially shrinking tumors in the liver and lymph nodes. Y90 and radiation are liver directed therapy.  The procedures were explained to them in detail.  - Although she is 83 year old, she is in good health overall. It is believed she will be okay to try gemcitabine , cisplatin  and durvalumab  every 2 weeks, or single agent chemo gemcitabine  plus immunotherapy if she does not tolerate combination chemotherapy.  Benefit and and potential side effect of chemotherapy were discussed with patient and her family in great detail. -After lengthy discussion, patient wants to take some time to think about options, and get back to us  next week. - Will monitor for jaundice and abnormal liver function. - 08/09/2024 -patient presents for cycle 1 day 1 chemotherapy cisplatin  and gemcitabine , and immunotherapy durvalumab .

## 2024-08-08 NOTE — Progress Notes (Unsigned)
 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 -Dr. Lanny reviewed images with the patient and her family, and discussed the diagnosis, prognosis and treatment options with them in detail.   - Case was reviewed in our tumor board, unfortunately surgery is not an option due to the extensive disease and her age.   -Chemotherapy and local therapy (Y90 or radiation) are primary treatment options. Chemotherapy offers systemic treatment, potentially shrinking tumors in the liver and lymph nodes. Y90 and radiation are liver directed therapy.  The procedures were explained to them in detail.  - Although she is 83 year old, she is in good health overall. It is believed she will be okay to try gemcitabine , cisplatin  and durvalumab  every 2 weeks, or single agent chemo gemcitabine  plus immunotherapy if she does not tolerate combination chemotherapy.  Benefit and and potential side effect of chemotherapy were discussed with patient and her family in great detail. -After lengthy discussion, patient wants to take some time to think about options, and get back to us  next week. - Will monitor for jaundice and abnormal liver function. - 08/09/2024 -patient presents for cycle 1 day 1 chemotherapy cisplatin  and gemcitabine , and immunotherapy durvalumab . She is feeling well. Labs are stable. Proceed with treatment today and subsequent treatments as scheduled.      Intrahepatic cholangiocarcinoma  Patient presents for Cycle 1  day 1 chemotherapy cisplatin , gemcitabine , and immunotherapy duvralumab. She continues to be asymptomatic. Labs are good, overall. She does have slight elevation of renal functions. We discussed aggressive oral hydration. We discussed staying ahead of side effects such as nausea, vomiting, diarrhea, and/or constipation. Fatigue is a very common side effect. She should rest if she feels tired, however, she does need to be active, at least for short periods of time, every day. Recommended she use prescribed and OTC medications at the onset of symptoms to help prevent them from becoming severe. She understands that she may contact the clinic for symptoms which are not relieved with home medications. All questions were answered.   Plan Labs from 08/04/2024 were reviewed.  -cbc was unremarkable.  -slight elevation of renal functions. Encouraged aggressive oral hydration  -mild elevation of t.bili at 1.4. This is within treatment parameters. Other liver functions unremarkable.  -magnesium  normal at 1.7. -TSH mildly elevated. Free T4 normal.  Labs and patient presentation appropriate for treatment.  Proceed with Cycle 1 day 1 chemotherapy cisplatin  and gemcitabine  and immunotherapy durvalumab  today.  Strict instructions to contact the clinic with concerns or severe side effects.  Labs/flush, follow up, and subsequent treatment as scheduled.   The patient understands the plans discussed today and is in agreement with them.  She knows to contact our office if she develops concerns prior to her next appointment.  I provided 15 minutes of face-to-face time during this encounter and > 50% was spent counseling as documented under my assessment and plan.    Powell FORBES Lessen, NP  [facility] - A DEPT [facility] [address] [place]  [place] [address] Dept: [phone] Dept Fax: [phone]   No orders of the defined types were placed in  this encounter.     CHIEF COMPLAINT:  CC: intrahepatic cholangiocarcinoma   Current Treatment: Gemcitabine , cisplatin , and durvalumab  every 2 weeks  INTERVAL HISTORY:  Gina Duncan is here today for repeat clinical assessment.  She last saw Dr. Lanny on 07/19/2024.  Today, she presents for cycle 1 day 1 chemotherapy gemcitabine  and cisplatin , and immunotherapy durvalumab . Labs are good, overall. She does have slight elevation of renal functions. We discussed aggressive oral hydration. We discussed staying ahead of side effects such as nausea, vomiting, diarrhea, and/or constipation. Fatigue is a very common side effect. She denies chest pain, chest pressure, or shortness of breath. She denies headaches or visual disturbances. She denies abdominal pain, nausea, vomiting, or changes in bowel or bladder habits.   She denies fevers or chills. She denies pain. Her appetite is good. Her weight has increased 2 pounds over last week.  I have reviewed the past medical history, past surgical history, social history and family history with the patient and they are unchanged from previous note.  ALLERGIES:  has no known allergies.  MEDICATIONS:  Current Outpatient Medications  Medication Sig Dispense Refill   acetaminophen  (TYLENOL ) 500 MG tablet Take 500 mg by mouth every 6 (six) hours as needed for moderate pain (pain score 4-6).     amiodarone  (PACERONE ) 200 MG tablet Take 1 tablet (200 mg total) by mouth daily. Take amiodarone  200 mg p.o. twice daily for 3 weeks starting from 06/08/2024 till 06/29/2024.  Then start taking amiodarone  200 mg p.o. daily 90 tablet 0   apixaban  (ELIQUIS ) 5 MG TABS tablet Take 1 tablet (5 mg total) by mouth 2 (two) times daily. 180 tablet 3   hydrochlorothiazide (HYDRODIURIL) 12.5 MG tablet Take 12.5 mg by mouth.     lidocaine -prilocaine  (EMLA ) cream Apply to affected area once 30 g 3   losartan  (COZAAR ) 25 MG tablet Take 1 tablet (25 mg total) by mouth daily. 30 tablet 2    metoprolol  tartrate (LOPRESSOR ) 25 MG tablet Take 1 tablet (25 mg total) by mouth 2 (two) times daily. 60 tablet 11   omeprazole (PRILOSEC OTC) 20 MG tablet Take 20 mg by mouth daily as needed (for heartburn).     ondansetron  (ZOFRAN ) 8 MG tablet Take 1 tablet (8 mg total) by mouth every 8 (eight) hours as needed for nausea or vomiting. Start on the third day after cisplatin . 30 tablet 1   prochlorperazine  (COMPAZINE ) 10 MG tablet Take 1 tablet (10 mg total) by mouth every 6 (six) hours as needed (Nausea or vomiting). 30 tablet 1   rosuvastatin  (CRESTOR ) 5 MG tablet Take 1 tablet by mouth once daily 90 tablet 1   No current facility-administered medications for this visit.   Facility-Administered Medications Ordered in Other Visits  Medication Dose Route Frequency Provider Last Rate Last Admin   0.9 %  sodium chloride  infusion   Intravenous Continuous Lanny Callander, MD 10 mL/hr at 08/09/24 0800 New Bag at 08/09/24 0800   aprepitant  (CINVANTI ) injection 130 mg  130 mg Intravenous Once Lanny Callander, MD       CISplatin  (PLATINOL ) 42 mg in sodium chloride  0.9 % 250 mL chemo infusion  25 mg/m2 (Treatment Plan Recorded) Intravenous Once Lanny Callander, MD       durvalumab  (IMFINZI ) 1,500 mg in sodium chloride  0.9 % 100 mL chemo infusion  1,500 mg Intravenous Once Lanny Callander, MD  gemcitabine  (GEMZAR ) 1,672 mg in sodium chloride  0.9 % 250 mL chemo infusion  1,000 mg/m2 (Treatment Plan Recorded) Intravenous Once Lanny Callander, MD       palonosetron  (ALOXI ) injection 0.25 mg  0.25 mg Intravenous Once Lanny Callander, MD        HISTORY OF PRESENT ILLNESS:   Oncology History  Intrahepatic cholangiocarcinoma (HCC)  07/19/2024 Initial Diagnosis   Intrahepatic cholangiocarcinoma (HCC)   08/09/2024 -  Chemotherapy   Patient is on Treatment Plan : BILIARY TRACT Cisplatin  + Gemcitabine  D1,8 + Durvalumab  (1500) D1 q21d / Durvalumab  (1500) q28d         REVIEW OF SYSTEMS:   Constitutional: Denies fevers, chills or abnormal  weight loss Eyes: Denies blurriness of vision Ears, nose, mouth, throat, and face: Denies mucositis or sore throat Respiratory: Denies cough, dyspnea or wheezes Cardiovascular: Denies palpitation, chest discomfort or lower extremity swelling Gastrointestinal:  Denies nausea, heartburn or change in bowel habits Skin: Denies abnormal skin rashes Lymphatics: Denies new lymphadenopathy or easy bruising Neurological:Denies numbness, tingling or new weaknesses Behavioral/Psych: Mood is stable, no new changes  All other systems were reviewed with the patient and are negative.   VITALS:   Today's Vitals   08/09/24 0954  BP: 133/60  Pulse: 60  Resp: 16  SpO2: 95%  Weight: 132 lb (59.9 kg)   Body mass index is 21.97 kg/m.   Wt Readings from Last 3 Encounters:  08/09/24 132 lb (59.9 kg)  08/09/24 132 lb (59.9 kg)  08/02/24 130 lb (59 kg)    Body mass index is 21.97 kg/m.  Performance status (ECOG): 1 - Symptomatic but completely ambulatory  PHYSICAL EXAM:   GENERAL:alert, no distress and comfortable SKIN: skin color, texture, turgor are normal, no rashes or significant lesions. She has mild bruising around port site. Insertion last week. There is no redness, warmth, or evidence of infection. No problems with blood return or port access this morning. EYES: normal, Conjunctiva are pink and non-injected, sclera clear OROPHARYNX:no exudate, no erythema and lips, buccal mucosa, and tongue normal  NECK: supple, thyroid normal size, non-tender, without nodularity LYMPH:  no palpable lymphadenopathy in the cervical, axillary or inguinal LUNGS: clear to auscultation and percussion with normal breathing effort HEART: regular rate & rhythm and no murmurs and no lower extremity edema ABDOMEN:abdomen soft, non-tender and normal bowel sounds Musculoskeletal:no cyanosis of digits and no clubbing  NEURO: alert & oriented x 3 with fluent speech, no focal motor/sensory deficits  LABORATORY DATA:   I have reviewed the data as listed    Component Value Date/Time   NA 142 08/04/2024 1149   NA 142 04/29/2022 1111   K 3.9 08/04/2024 1149   CL 101 08/04/2024 1149   CO2 29 08/04/2024 1149   GLUCOSE 97 08/04/2024 1149   BUN 23 08/04/2024 1149   BUN 17 04/29/2022 1111   CREATININE 1.10 (H) 08/04/2024 1149   CALCIUM  9.2 08/04/2024 1149   PROT 7.6 08/04/2024 1149   ALBUMIN 4.2 08/04/2024 1149   AST 28 08/04/2024 1149   ALT 8 08/04/2024 1149   ALKPHOS 85 08/04/2024 1149   BILITOT 1.4 (H) 08/04/2024 1149   GFRNONAA 51 (L) 08/04/2024 1149     Lab Results  Component Value Date   WBC 8.1 08/04/2024   NEUTROABS 5.4 08/04/2024   HGB 13.7 08/04/2024   HCT 44.3 08/04/2024   MCV 99.3 08/04/2024   PLT 248 08/04/2024    RADIOGRAPHIC STUDIES: IR IMAGING GUIDED PORT INSERTION Result Date: 08/02/2024  INDICATION: 83 year old female with history of cholangiocarcinoma requiring central venous access for chemotherapy administration. EXAM: IMPLANTED PORT A CATH PLACEMENT WITH ULTRASOUND AND FLUOROSCOPIC GUIDANCE COMPARISON:  None Available. MEDICATIONS: None. ANESTHESIA/SEDATION: Moderate (conscious) sedation was employed during this procedure. A total of Versed  1 mg and Fentanyl  100 mcg was administered intravenously. Moderate Sedation Time: 20 minutes. The patient's level of consciousness and vital signs were monitored continuously by radiology nursing throughout the procedure under my direct supervision. CONTRAST:  None FLUOROSCOPY TIME:  0 mGy reference air kerma COMPLICATIONS: None immediate. PROCEDURE: The procedure, risks, benefits, and alternatives were explained to the patient. Questions regarding the procedure were encouraged and answered. The patient understands and consents to the procedure. The right neck and chest were prepped with chlorhexidine  in a sterile fashion, and a sterile drape was applied covering the operative field. Maximum barrier sterile technique with sterile gowns and  gloves were used for the procedure. A timeout was performed prior to the initiation of the procedure. Ultrasound was used to examine the jugular vein which was compressible and free of internal echoes. A skin marker was used to demarcate the planned venotomy and port pocket incision sites. Local anesthesia was provided to these sites and the subcutaneous tunnel track with 1% lidocaine  with 1:100,000 epinephrine . A small incision was created at the jugular access site and blunt dissection was performed of the subcutaneous tissues. Under ultrasound guidance, the jugular vein was accessed with a 21 ga micropuncture needle and an 0.018 wire was inserted to the superior vena cava. Real-time ultrasound guidance was utilized for vascular access including the acquisition of a permanent ultrasound image documenting patency of the accessed vessel. A 5 Fr micopuncture set was then used, through which a 0.035 Rosen wire was passed under fluoroscopic guidance into the inferior vena cava. An 8 Fr dilator was then placed over the wire. A subcutaneous port pocket was then created along the upper chest wall utilizing a combination of sharp and blunt dissection. The pocket was irrigated with sterile saline, packed with gauze, and observed for hemorrhage. A single lumen clear view power injectable port was chosen for placement. The 8 Fr catheter was tunneled from the port pocket site to the venotomy incision. The port was placed in the pocket. The external catheter was trimmed to appropriate length. The dilator was exchanged for an 8 Fr peel-away sheath under fluoroscopic guidance. The catheter was then placed through the sheath and the sheath was removed. Final catheter positioning was confirmed and documented with a fluoroscopic spot radiograph. The port was accessed with a Huber needle, aspirated, and flushed with heparinized saline. The deep dermal layer of the port pocket incision was closed with interrupted 3-0 Vicryl suture.  The skin was opposed with a running subcuticular 4-0 Monocryl suture. Dermabond was then placed over the port pocket and neck incisions. The patient tolerated the procedure well without immediate post procedural complication. FINDINGS: After catheter placement, the tip lies within the superior cavoatrial junction. The catheter aspirates and flushes normally and is ready for immediate use. IMPRESSION: Successful placement of a power injectable Port-A-Cath via the right internal jugular vein. The catheter is ready for immediate use. Ester Sides, MD [facility] Electronically Signed   By: Ester Sides M.D.   On: 08/02/2024 15:07

## 2024-08-09 ENCOUNTER — Inpatient Hospital Stay: Admitting: Nurse Practitioner

## 2024-08-09 ENCOUNTER — Encounter: Payer: Self-pay | Admitting: Hematology

## 2024-08-09 ENCOUNTER — Inpatient Hospital Stay

## 2024-08-09 ENCOUNTER — Encounter: Payer: Self-pay | Admitting: Nurse Practitioner

## 2024-08-09 VITALS — BP 133/60 | HR 60 | Resp 16 | Wt 132.0 lb

## 2024-08-09 VITALS — BP 146/60 | HR 64 | Temp 98.0°F | Resp 18 | Wt 132.0 lb

## 2024-08-09 DIAGNOSIS — C221 Intrahepatic bile duct carcinoma: Secondary | ICD-10-CM | POA: Diagnosis not present

## 2024-08-09 DIAGNOSIS — Z5111 Encounter for antineoplastic chemotherapy: Secondary | ICD-10-CM | POA: Diagnosis not present

## 2024-08-09 MED ORDER — SODIUM CHLORIDE 0.9 % IV SOLN
25.0000 mg/m2 | Freq: Once | INTRAVENOUS | Status: AC
  Administered 2024-08-09: 42 mg via INTRAVENOUS
  Filled 2024-08-09: qty 42

## 2024-08-09 MED ORDER — PALONOSETRON HCL INJECTION 0.25 MG/5ML
0.2500 mg | Freq: Once | INTRAVENOUS | Status: AC
  Administered 2024-08-09: 0.25 mg via INTRAVENOUS
  Filled 2024-08-09: qty 5

## 2024-08-09 MED ORDER — SODIUM CHLORIDE 0.9 % IV SOLN
1500.0000 mg | Freq: Once | INTRAVENOUS | Status: AC
  Administered 2024-08-09: 1500 mg via INTRAVENOUS
  Filled 2024-08-09: qty 30

## 2024-08-09 MED ORDER — SODIUM CHLORIDE 0.9 % IV SOLN: INTRAVENOUS | Status: DC

## 2024-08-09 MED ORDER — SODIUM CHLORIDE 0.9 % IV SOLN
1000.0000 mg/m2 | Freq: Once | INTRAVENOUS | Status: AC
  Administered 2024-08-09: 1672 mg via INTRAVENOUS
  Filled 2024-08-09: qty 43.97

## 2024-08-09 MED ORDER — POTASSIUM CHLORIDE IN NACL 20-0.9 MEQ/L-% IV SOLN
Freq: Once | INTRAVENOUS | Status: AC
  Filled 2024-08-09: qty 1000

## 2024-08-09 MED ORDER — MAGNESIUM SULFATE 2 GM/50ML IV SOLN
2.0000 g | Freq: Once | INTRAVENOUS | Status: AC
  Administered 2024-08-09: 2 g via INTRAVENOUS
  Filled 2024-08-09: qty 50

## 2024-08-09 MED ORDER — APREPITANT 130 MG/18ML IV EMUL
130.0000 mg | Freq: Once | INTRAVENOUS | Status: AC
  Administered 2024-08-09: 130 mg via INTRAVENOUS
  Filled 2024-08-09: qty 18

## 2024-08-09 NOTE — Progress Notes (Signed)
 CrCl  ~38 mL/min. Okay to proceed with full dose cisplatin  today per Dr. Lonn.  Harlene Nasuti, PharmD Oncology Infusion Pharmacist 08/09/2024 10:39 AM

## 2024-08-09 NOTE — Progress Notes (Signed)
 Patient's daughter called after receiving my card per my request.  Introduced myself as Dance Movement Psychotherapist and to ask if pt had any financial questions or concerns regarding treatment. She states they were told chemo was very expensive and they could apply for a grant to help with expenses. Advised the best step to take would be finding out from insurance what her deductible and OOP amounts are and how much she has met thus far. This would provide a better understanding of expected cost amount and when they pay at 100%. She verbalized understanding.  Screened for [employer] to assist with personal expenses while going through treatment. Patient does not qualify to apply. Advised to contact me should any criteria responses change.  Advised [employer] is our third party that handle copay assistance and they will reach out directly if she qualifies for any assistance through them.  She has my card for any additional financial questions or concerns.

## 2024-08-09 NOTE — Patient Instructions (Signed)
 [facility] - A DEPT OF [facility]  Discharge Instructions: Thank you for choosing [facility] to provide your oncology and hematology care.   If you have a lab appointment with the Cancer Center, please go directly to the Cancer Center and check in at the registration area.   Wear comfortable clothing and clothing appropriate for easy access to any Portacath or PICC line.   We strive to give you quality time with your provider. You may need to reschedule your appointment if you arrive late (15 or more minutes).  Arriving late affects you and other patients whose appointments are after yours.  Also, if you miss three or more appointments without notifying the office, you may be dismissed from the clinic at the provider's discretion.      For prescription refill requests, have your pharmacy contact our office and allow 72 hours for refills to be completed.    Today you received the following chemotherapy and/or immunotherapy agents: Durvalumab  (Imfinzi ), Gemcitabine  (Gemzar ) and Cisplatin  (Platinol )     To help prevent nausea and vomiting after your treatment, we encourage you to take your nausea medication as directed.  BELOW ARE SYMPTOMS THAT SHOULD BE REPORTED IMMEDIATELY: *FEVER GREATER THAN 100.4 F (38 C) OR HIGHER *CHILLS OR SWEATING *NAUSEA AND VOMITING THAT IS NOT CONTROLLED WITH YOUR NAUSEA MEDICATION *UNUSUAL SHORTNESS OF BREATH *UNUSUAL BRUISING OR BLEEDING *URINARY PROBLEMS (pain or burning when urinating, or frequent urination) *BOWEL PROBLEMS (unusual diarrhea, constipation, pain near the anus) TENDERNESS IN MOUTH AND THROAT WITH OR WITHOUT PRESENCE OF ULCERS (sore throat, sores in mouth, or a toothache) UNUSUAL RASH, SWELLING OR PAIN  UNUSUAL VAGINAL DISCHARGE OR ITCHING   Items with * indicate a potential emergency and should be followed up as soon as possible or go to the Emergency Department if any problems should  occur.  Please show the CHEMOTHERAPY ALERT CARD or IMMUNOTHERAPY ALERT CARD at check-in to the Emergency Department and triage nurse.  Should you have questions after your visit or need to cancel or reschedule your appointment, please contact [facility] - A DEPT OF [facility]  Dept: [phone]  and follow the prompts.  Office hours are 8:00 a.m. to 4:30 p.m. Monday - Friday. Please note that voicemails left after 4:00 p.m. may not be returned until the following business day.  We are closed weekends and major holidays. You have access to a nurse at all times for urgent questions. Please call the main number to the clinic Dept: [phone] and follow the prompts.   For any non-urgent questions, you may also contact your provider using MyChart. We now offer e-Visits for anyone 42 and older to request care online for non-urgent symptoms. For details visit [email or web address].   Also download the MyChart app! Go to the app store, search MyChart, open the app, select [place], and log in with your MyChart username and password.  Durvalumab  Injection What is this medication? DURVALUMAB  (dur VAL ue mab) treats some types of cancer. It works by helping your immune system slow or stop the spread of cancer cells. It is a monoclonal antibody. This medicine may be used for other purposes; ask your health care provider or pharmacist if you have questions. COMMON BRAND NAME(S): IMFINZI  What should I tell my care team before I take this medication? They need to know if you have any of these conditions: Allogeneic stem cell transplant (uses  someone else's stem cells) Autoimmune diseases, such as Crohn disease, ulcerative colitis, lupus History of chest radiation Nervous system problems, such as Guillain-Barre syndrome, myasthenia gravis Organ transplant An unusual or allergic reaction to durvalumab , other medications, foods, dyes, or preservatives Pregnant or  trying to get pregnant Breast-feeding How should I use this medication? This medication is infused into a vein. It is given by your care team in a hospital or clinic setting. A special MedGuide will be given to you before each treatment. Be sure to read this information carefully each time. Talk to your care team about the use of this medication in children. Special care may be needed. Overdosage: If you think you have taken too much of this medicine contact a poison control center or emergency room at once. NOTE: This medicine is only for you. Do not share this medicine with others. What if I miss a dose? Keep appointments for follow-up doses. It is important not to miss your dose. Call your care team if you are unable to keep an appointment. What may interact with this medication? Interactions have not been studied. This list may not describe all possible interactions. Give your health care provider a list of all the medicines, herbs, non-prescription drugs, or dietary supplements you use. Also tell them if you smoke, drink alcohol, or use illegal drugs. Some items may interact with your medicine. What should I watch for while using this medication? Your condition will be monitored carefully while you are receiving this medication. You may need blood work while taking this medication. This medication may cause serious skin reactions. They can happen weeks to months after starting the medication. Contact your care team right away if you notice fevers or flu-like symptoms with a rash. The rash may be red or purple and then turn into blisters or peeling of the skin. You may also notice a red rash with swelling of the face, lips, or lymph nodes in your neck or under your arms. Tell your care team right away if you have any change in your eyesight. Talk to your care team if you may be pregnant. Serious birth defects can occur if you take this medication during pregnancy and for 3 months after the last  dose. You will need a negative pregnancy test before starting this medication. Contraception is recommended while taking this medication and for 3 months after the last dose. Your care team can help you find the option that works for you. Do not breastfeed while taking this medication and for 3 months after the last dose. What side effects may I notice from receiving this medication? Side effects that you should report to your care team as soon as possible: Allergic reactions--skin rash, itching, hives, swelling of the face, lips, tongue, or throat Dry cough, shortness of breath or trouble breathing Eye pain, redness, irritation, or discharge with blurry or decreased vision Heart muscle inflammation--unusual weakness or fatigue, shortness of breath, chest pain, fast or irregular heartbeat, dizziness, swelling of the ankles, feet, or hands Hormone gland problems--headache, sensitivity to light, unusual weakness or fatigue, dizziness, fast or irregular heartbeat, increased sensitivity to cold or heat, excessive sweating, constipation, hair loss, increased thirst or amount of urine, tremors or shaking, irritability Infusion reactions--chest pain, shortness of breath or trouble breathing, feeling faint or lightheaded Kidney injury (glomerulonephritis)--decrease in the amount of urine, red or dark brown urine, foamy or bubbly urine, swelling of the ankles, hands, or feet Liver injury--right upper belly pain, loss of appetite, nausea,  light-colored stool, dark yellow or brown urine, yellowing skin or eyes, unusual weakness or fatigue Pain, tingling, or numbness in the hands or feet, muscle weakness, change in vision, confusion or trouble speaking, loss of balance or coordination, trouble walking, seizures Rash, fever, and swollen lymph nodes Redness, blistering, peeling, or loosening of the skin, including inside the mouth Sudden or severe stomach pain, bloody diarrhea, fever, nausea, vomiting Side effects  that usually do not require medical attention (report these to your care team if they continue or are bothersome): Bone, joint, or muscle pain Diarrhea Fatigue Loss of appetite Nausea Skin rash This list may not describe all possible side effects. Call your doctor for medical advice about side effects. You may report side effects to FDA at 1-800-FDA-1088. Where should I keep my medication? This medication is given in a hospital or clinic. It will not be stored at home. NOTE: This sheet is a summary. It may not cover all possible information. If you have questions about this medicine, talk to your doctor, pharmacist, or health care provider.  2024 Elsevier/Gold Standard (2021-12-31 00:00:00)  Gemcitabine  Injection What is this medication? GEMCITABINE  (jem SYE ta been) treats some types of cancer. It works by slowing down the growth of cancer cells. This medicine may be used for other purposes; ask your health care provider or pharmacist if you have questions. COMMON BRAND NAME(S): Gemzar , Infugem  What should I tell my care team before I take this medication? They need to know if you have any of these conditions: Blood disorders Infection Kidney disease Liver disease Lung or breathing disease, such as asthma or COPD Recent or ongoing radiation therapy An unusual or allergic reaction to gemcitabine , other medications, foods, dyes, or preservatives If you or your partner are pregnant or trying to get pregnant Breast-feeding How should I use this medication? This medication is injected into a vein. It is given by your care team in a hospital or clinic setting. Talk to your care team about the use of this medication in children. Special care may be needed. Overdosage: If you think you have taken too much of this medicine contact a poison control center or emergency room at once. NOTE: This medicine is only for you. Do not share this medicine with others. What if I miss a dose? Keep  appointments for follow-up doses. It is important not to miss your dose. Call your care team if you are unable to keep an appointment. What may interact with this medication? Interactions have not been studied. This list may not describe all possible interactions. Give your health care provider a list of all the medicines, herbs, non-prescription drugs, or dietary supplements you use. Also tell them if you smoke, drink alcohol, or use illegal drugs. Some items may interact with your medicine. What should I watch for while using this medication? Your condition will be monitored carefully while you are receiving this medication. This medication may make you feel generally unwell. This is not uncommon, as chemotherapy can affect healthy cells as well as cancer cells. Report any side effects. Continue your course of treatment even though you feel ill unless your care team tells you to stop. In some cases, you may be given additional medications to help with side effects. Follow all directions for their use. This medication may increase your risk of getting an infection. Call your care team for advice if you get a fever, chills, sore throat, or other symptoms of a cold or flu. Do not treat yourself.  Try to avoid being around people who are sick. This medication may increase your risk to bruise or bleed. Call your care team if you notice any unusual bleeding. Be careful brushing or flossing your teeth or using a toothpick because you may get an infection or bleed more easily. If you have any dental work done, tell your dentist you are receiving this medication. Avoid taking medications that contain aspirin , acetaminophen , ibuprofen, naproxen, or ketoprofen unless instructed by your care team. These medications may hide a fever. Talk to your care team if you or your partner wish to become pregnant or think you might be pregnant. This medication can cause serious birth defects if taken during pregnancy and for 6  months after the last dose. A negative pregnancy test is required before starting this medication. A reliable form of contraception is recommended while taking this medication and for 6 months after the last dose. Talk to your care team about effective forms of contraception. Do not father a child while taking this medication and for 3 months after the last dose. Use a condom while having sex during this time period. Do not breastfeed while taking this medication and for at least 1 week after the last dose. This medication may cause infertility. Talk to your care team if you are concerned about your fertility. What side effects may I notice from receiving this medication? Side effects that you should report to your care team as soon as possible: Allergic reactions--skin rash, itching, hives, swelling of the face, lips, tongue, or throat Capillary leak syndrome--stomach or muscle pain, unusual weakness or fatigue, feeling faint or lightheaded, decrease in the amount of urine, swelling of the ankles, hands, or feet, trouble breathing Infection--fever, chills, cough, sore throat, wounds that don't heal, pain or trouble when passing urine, general feeling of discomfort or being unwell Liver injury--right upper belly pain, loss of appetite, nausea, light-colored stool, dark yellow or brown urine, yellowing skin or eyes, unusual weakness or fatigue Low red blood cell level--unusual weakness or fatigue, dizziness, headache, trouble breathing Lung injury--shortness of breath or trouble breathing, cough, spitting up blood, chest pain, fever Stomach pain, bloody diarrhea, pale skin, unusual weakness or fatigue, decrease in the amount of urine, which may be signs of hemolytic uremic syndrome Sudden and severe headache, confusion, change in vision, seizures, which may be signs of posterior reversible encephalopathy syndrome (PRES) Unusual bruising or bleeding Side effects that usually do not require medical  attention (report to your care team if they continue or are bothersome): Diarrhea Drowsiness Hair loss Nausea Pain, redness, or swelling with sores inside the mouth or throat Vomiting This list may not describe all possible side effects. Call your doctor for medical advice about side effects. You may report side effects to FDA at 1-800-FDA-1088. Where should I keep my medication? This medication is given in a hospital or clinic. It will not be stored at home. NOTE: This sheet is a summary. It may not cover all possible information. If you have questions about this medicine, talk to your doctor, pharmacist, or health care provider.  2024 Elsevier/Gold Standard (2021-12-24 00:00:00)  Cisplatin  Injection What is this medication? CISPLATIN  (SIS pla tin) treats some types of cancer. It works by slowing down the growth of cancer cells. This medicine may be used for other purposes; ask your health care provider or pharmacist if you have questions. COMMON BRAND NAME(S): Platinol , Platinol  -AQ What should I tell my care team before I take this medication? They need  to know if you have any of these conditions: Eye disease, vision problems Hearing problems Kidney disease Low blood counts, such as low white cells, platelets, or red blood cells Tingling of the fingers or toes, or other nerve disorder An unusual or allergic reaction to cisplatin , carboplatin, oxaliplatin, other medications, foods, dyes, or preservatives If you or your partner are pregnant or trying to get pregnant Breast-feeding How should I use this medication? This medication is injected into a vein. It is given by your care team in a hospital or clinic setting. Talk to your care team about the use of this medication in children. Special care may be needed. Overdosage: If you think you have taken too much of this medicine contact a poison control center or emergency room at once. NOTE: This medicine is only for you. Do not share  this medicine with others. What if I miss a dose? Keep appointments for follow-up doses. It is important not to miss your dose. Call your care team if you are unable to keep an appointment. What may interact with this medication? Do not take this medication with any of the following: Live virus vaccines This medication may also interact with the following: Certain antibiotics, such as amikacin, gentamicin, neomycin, polymyxin B, streptomycin, tobramycin, vancomycin Foscarnet This list may not describe all possible interactions. Give your health care provider a list of all the medicines, herbs, non-prescription drugs, or dietary supplements you use. Also tell them if you smoke, drink alcohol, or use illegal drugs. Some items may interact with your medicine. What should I watch for while using this medication? Your condition will be monitored carefully while you are receiving this medication. You may need blood work done while taking this medication. This medication may make you feel generally unwell. This is not uncommon, as chemotherapy can affect healthy cells as well as cancer cells. Report any side effects. Continue your course of treatment even though you feel ill unless your care team tells you to stop. This medication may increase your risk of getting an infection. Call your care team for advice if you get a fever, chills, sore throat, or other symptoms of a cold or flu. Do not treat yourself. Try to avoid being around people who are sick. Avoid taking medications that contain aspirin , acetaminophen , ibuprofen, naproxen, or ketoprofen unless instructed by your care team. These medications may hide a fever. This medication may increase your risk to bruise or bleed. Call your care team if you notice any unusual bleeding. Be careful brushing or flossing your teeth or using a toothpick because you may get an infection or bleed more easily. If you have any dental work done, tell your dentist you are  receiving this medication. Drink fluids as directed while you are taking this medication. This will help protect your kidneys. Call your care team if you get diarrhea. Do not treat yourself. Talk to your care team if you or your partner wish to become pregnant or think you might be pregnant. This medication can cause serious birth defects if taken during pregnancy and for 14 months after the last dose. A negative pregnancy test is required before starting this medication. A reliable form of contraception is recommended while taking this medication and for 14 months after the last dose. Talk to your care team about effective forms of contraception. Do not father a child while taking this medication and for 11 months after the last dose. Use a condom during sex during this time period. Do  not breast-feed while taking this medication. This medication may cause infertility. Talk to your care team if you are concerned about your fertility. What side effects may I notice from receiving this medication? Side effects that you should report to your care team as soon as possible: Allergic reactions--skin rash, itching, hives, swelling of the face, lips, tongue, or throat Eye pain, change in vision, vision loss Hearing loss, ringing in ears Infection--fever, chills, cough, sore throat, wounds that don't heal, pain or trouble when passing urine, general feeling of discomfort or being unwell Kidney injury--decrease in the amount of urine, swelling of the ankles, hands, or feet Low red blood cell level--unusual weakness or fatigue, dizziness, headache, trouble breathing Painful swelling, warmth, or redness of the skin, blisters or sores at the infusion site Pain, tingling, or numbness in the hands or feet Unusual bruising or bleeding Side effects that usually do not require medical attention (report to your care team if they continue or are bothersome): Hair loss Nausea Vomiting This list may not describe all  possible side effects. Call your doctor for medical advice about side effects. You may report side effects to FDA at 1-800-FDA-1088. Where should I keep my medication? This medication is given in a hospital or clinic. It will not be stored at home. NOTE: This sheet is a summary. It may not cover all possible information. If you have questions about this medicine, talk to your doctor, pharmacist, or health care provider.  2024 Elsevier/Gold Standard (2021-12-20 00:00:00)

## 2024-08-09 NOTE — Progress Notes (Signed)
 PATIENT NAVIGATOR PROGRESS NOTE  Name: Gina Duncan Date: 08/09/2024 MRN: [id number]  DOB: [birth year]   Reason for visit:  Initial Chemo Infusion Appt  Comments:   Met with patient and her husband during her initial chemo infusion.  Informed patient that my role in her care is now complete and instructed patient to call the main cancer center number and follow the prompts to speak to Dr. Demetra nursing team going forward.  Patient and husband verbalized understanding.   Patient is established with a treatment plan and is actively engaged in care. Nurse Navigator services not currently indicated at this time. Will re-evaluate if needs change or if additional support is requested.   Time spent counseling/coordinating care: 30-45 minutes

## 2024-08-10 ENCOUNTER — Telehealth: Payer: Self-pay

## 2024-08-10 NOTE — Telephone Encounter (Signed)
-----   Message from Nurse Oza S sent at 08/09/2024  3:07 PM EST ----- Regarding: Dr. Lanny; First time Imfinzi /Gemzar /Cisplatin  F/U Call Patient received first time Imfinzi /Gemzar /Cisplatin  on 08/09/24. Tolerated well :-) May have to call her daughter Jerel [phone]) if not able to reach patient   Thank you!

## 2024-08-11 ENCOUNTER — Ambulatory Visit: Admitting: Nurse Practitioner

## 2024-08-15 ENCOUNTER — Inpatient Hospital Stay: Admitting: Hematology

## 2024-08-15 ENCOUNTER — Inpatient Hospital Stay

## 2024-08-16 ENCOUNTER — Inpatient Hospital Stay

## 2024-08-19 ENCOUNTER — Other Ambulatory Visit: Payer: Self-pay

## 2024-08-19 DIAGNOSIS — C221 Intrahepatic bile duct carcinoma: Secondary | ICD-10-CM

## 2024-08-22 ENCOUNTER — Inpatient Hospital Stay: Admitting: Nurse Practitioner

## 2024-08-22 ENCOUNTER — Inpatient Hospital Stay

## 2024-08-22 DIAGNOSIS — Z5111 Encounter for antineoplastic chemotherapy: Secondary | ICD-10-CM | POA: Diagnosis not present

## 2024-08-22 DIAGNOSIS — C221 Intrahepatic bile duct carcinoma: Secondary | ICD-10-CM

## 2024-08-22 LAB — CBC WITH DIFFERENTIAL/PLATELET
Abs Immature Granulocytes: 0.02 K/uL (ref 0.00–0.07)
Basophils Absolute: 0 K/uL (ref 0.0–0.1)
Basophils Relative: 0 %
Eosinophils Absolute: 0.1 K/uL (ref 0.0–0.5)
Eosinophils Relative: 1 %
HCT: 40.4 % (ref 36.0–46.0)
Hemoglobin: 13 g/dL (ref 12.0–15.0)
Immature Granulocytes: 0 %
Lymphocytes Relative: 28 %
Lymphs Abs: 1.6 K/uL (ref 0.7–4.0)
MCH: 31.4 pg (ref 26.0–34.0)
MCHC: 32.2 g/dL (ref 30.0–36.0)
MCV: 97.6 fL (ref 80.0–100.0)
Monocytes Absolute: 0.7 K/uL (ref 0.1–1.0)
Monocytes Relative: 12 %
Neutro Abs: 3.3 K/uL (ref 1.7–7.7)
Neutrophils Relative %: 59 %
Platelets: 210 K/uL (ref 150–400)
RBC: 4.14 MIL/uL (ref 3.87–5.11)
RDW: 13.9 % (ref 11.5–15.5)
WBC: 5.6 K/uL (ref 4.0–10.5)
nRBC: 0 % (ref 0.0–0.2)

## 2024-08-22 LAB — COMPREHENSIVE METABOLIC PANEL WITH GFR
ALT: 11 U/L (ref 0–44)
AST: 28 U/L (ref 15–41)
Albumin: 4.3 g/dL (ref 3.5–5.0)
Alkaline Phosphatase: 72 U/L (ref 38–126)
Anion gap: 13 (ref 5–15)
BUN: 19 mg/dL (ref 8–23)
CO2: 29 mmol/L (ref 22–32)
Calcium: 9.4 mg/dL (ref 8.9–10.3)
Chloride: 97 mmol/L — ABNORMAL LOW (ref 98–111)
Creatinine, Ser: 1.1 mg/dL — ABNORMAL HIGH (ref 0.44–1.00)
GFR, Estimated: 50 mL/min — ABNORMAL LOW
Glucose, Bld: 100 mg/dL — ABNORMAL HIGH (ref 70–99)
Potassium: 4.3 mmol/L (ref 3.5–5.1)
Sodium: 140 mmol/L (ref 135–145)
Total Bilirubin: 0.9 mg/dL (ref 0.0–1.2)
Total Protein: 7.7 g/dL (ref 6.5–8.1)

## 2024-08-22 LAB — MAGNESIUM: Magnesium: 1.7 mg/dL (ref 1.7–2.4)

## 2024-08-23 ENCOUNTER — Inpatient Hospital Stay: Admitting: Hematology

## 2024-08-23 ENCOUNTER — Inpatient Hospital Stay

## 2024-08-23 VITALS — BP 163/80 | HR 57 | Temp 97.6°F | Resp 16 | Wt 131.5 lb

## 2024-08-23 DIAGNOSIS — C221 Intrahepatic bile duct carcinoma: Secondary | ICD-10-CM

## 2024-08-23 DIAGNOSIS — Z5111 Encounter for antineoplastic chemotherapy: Secondary | ICD-10-CM | POA: Diagnosis not present

## 2024-08-23 MED ORDER — PALONOSETRON HCL INJECTION 0.25 MG/5ML
0.2500 mg | Freq: Once | INTRAVENOUS | Status: AC
  Administered 2024-08-23: 0.25 mg via INTRAVENOUS
  Filled 2024-08-23: qty 5

## 2024-08-23 MED ORDER — APREPITANT 130 MG/18ML IV EMUL
130.0000 mg | Freq: Once | INTRAVENOUS | Status: AC
  Administered 2024-08-23: 130 mg via INTRAVENOUS
  Filled 2024-08-23: qty 18

## 2024-08-23 MED ORDER — SODIUM CHLORIDE 0.9 % IV SOLN: INTRAVENOUS | Status: DC

## 2024-08-23 MED ORDER — DEXAMETHASONE SOD PHOSPHATE PF 10 MG/ML IJ SOLN
10.0000 mg | Freq: Once | INTRAMUSCULAR | Status: AC
  Administered 2024-08-23: 10 mg via INTRAVENOUS

## 2024-08-23 MED ORDER — POTASSIUM CHLORIDE IN NACL 20-0.9 MEQ/L-% IV SOLN
Freq: Once | INTRAVENOUS | Status: AC
  Filled 2024-08-23: qty 1000

## 2024-08-23 MED ORDER — SODIUM CHLORIDE 0.9 % IV SOLN
1000.0000 mg/m2 | Freq: Once | INTRAVENOUS | Status: AC
  Administered 2024-08-23: 1672 mg via INTRAVENOUS
  Filled 2024-08-23: qty 43.97

## 2024-08-23 MED ORDER — MAGNESIUM SULFATE 2 GM/50ML IV SOLN
2.0000 g | Freq: Once | INTRAVENOUS | Status: AC
  Administered 2024-08-23: 2 g via INTRAVENOUS
  Filled 2024-08-23: qty 50

## 2024-08-23 MED ORDER — SODIUM CHLORIDE 0.9 % IV SOLN
25.0000 mg/m2 | Freq: Once | INTRAVENOUS | Status: AC
  Administered 2024-08-23: 42 mg via INTRAVENOUS
  Filled 2024-08-23: qty 42

## 2024-08-23 NOTE — Assessment & Plan Note (Addendum)
 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  - Case reviewed in the tumor board, she is not a candidate for surgical resection due to the extensive disease -She started first-line chemotherapy cisplatin , gemcitabine  and durvalumab  on August 09, 2024, every 2 weeks.

## 2024-08-23 NOTE — Progress Notes (Signed)
 " [facility]   [phone] Fax:([phone]   Clinic Follow up Note   Patient Care Team: [name], MD as PCP - General (Internal Medicine) [name] (General Practice) [name], MD as Medical Oncologist (Medical Oncology) [name], RN as Oncology Nurse Navigator (Medical Oncology)  Date of Service:  08/23/2024  CHIEF COMPLAINT: f/u of cholangiocarcinoma  CURRENT THERAPY:  First line chemotherapy cisplatin , gemcitabine  every 2 weeks and durvalumab  every 4 weeks  Oncology History   Intrahepatic cholangiocarcinoma (HCC) [id number] -Diagnosed in Oct 2025 after hospital admission, CT/MRI/PET showed 3.8 x 5.1 cm segment 4A hepatic mass, with additional 1cm hypermetabolic lesion in left lobe, and 1cm right Cardiophrenic Angle adenopathy, again suspicious for nodal metastasis.  - Case reviewed in the tumor board, she is not a candidate for surgical resection due to the extensive disease -She started first-line chemotherapy cisplatin , gemcitabine  and durvalumab  on August 09, 2024, every 2 weeks.  Assessment & Plan Unresectable intrahepatic cholangiocarcinoma She is undergoing first-line chemotherapy with good initial tolerance and no significant adverse effects after the first cycle. She has maintained weight, reports no new gastrointestinal or neurologic symptoms, and her surgical site is well-healed. The chemotherapy regimen was modified to every other week due to age and travel distance. Ongoing monitoring for cumulative fatigue and chemotherapy-related toxicities is indicated. - Continued first-line chemotherapy every other week due to age and travel considerations. - Encouraged high-protein, high-calorie diet to support nutritional status. - Planned interval CT imaging after 2-3 months (after cycle 5 or 6) to assess response to chemotherapy. - Scheduled follow-up in two weeks with nurse practitioner and in four weeks with  me   Chronic kidney disease She has chronic kidney disease with mildly elevated creatinine, likely impacted by chemotherapy and age. She receives intravenous fluids with chemotherapy and increased oral hydration to support renal function. - Encouraged oral hydration with at least 64 ounces (two 32-ounce thermoses) of water daily. - Continued intravenous fluids with chemotherapy per protocol.  Plan - Doing well, lab reviewed, cycle 2 chemotherapy today - Follow-up in 2 weeks, before next cycle chemo and durvalumab    SUMMARY OF ONCOLOGIC HISTORY: Oncology History  Intrahepatic cholangiocarcinoma (HCC)  06/13/2024 Cancer Staging   Staging form: Intrahepatic Bile Duct, AJCC 8th Edition - Clinical stage from 06/13/2024: Stage IIIB (cT1b, cN1, cM0) - Signed by Lanny Callander, MD on 08/23/2024   07/19/2024 Initial Diagnosis   Intrahepatic cholangiocarcinoma (HCC)   08/09/2024 -  Chemotherapy   Patient is on Treatment Plan : BILIARY TRACT Cisplatin  + Gemcitabine  D1,8 + Durvalumab  (1500) D1 q21d / Durvalumab  (1500) q28d        Discussed the use of AI scribe software for clinical note transcription with the patient, who gave verbal consent to proceed.  History of Present Illness Gina Duncan is an 83 year old female with intrahepatic cholangiocarcinoma who presents for follow-up after initial chemotherapy.  She received her first infusion of first-line chemotherapy yesterday and tolerated it well without nausea, vomiting, peripheral neuropathy, or edema. Energy returned by today. She has not needed antiemetics. Bowel habits are regular without constipation, weight is stable to slightly increased, and she notes only minimal abdominal discomfort.  She has chronic kidney disease with recent mildly elevated creatinine. She is increasing oral fluids and receives intravenous fluids with chemotherapy to support renal function. She is encouraged to follow a high-protein, high-calorie diet during  treatment.     All other systems were reviewed with the patient and  are negative.  MEDICAL HISTORY:  Past Medical History:  Diagnosis Date   CAD (coronary artery disease)    DES to LAD 2017   Endometrial cancer (HCC)    Hypertension    Radiation 06/01/15, 06/12/15, 06/15/15, 06/19/15, 06/21/15   proximal vagina 30 gray    SURGICAL HISTORY: Past Surgical History:  Procedure Laterality Date   ABDOMINAL HYSTERECTOMY     cardiac stents  2017   CARDIOVERSION N/A 04/21/2022   Procedure: CARDIOVERSION;  Surgeon: Shlomo Wilbert SAUNDERS, MD;  Location: [facility];  Service: Cardiovascular;  Laterality: N/A;   CHOLECYSTECTOMY     IR IMAGING GUIDED PORT INSERTION  08/02/2024   ROBOTIC ASSISTED TOTAL HYSTERECTOMY WITH BILATERAL SALPINGO OOPHERECTOMY  02/13/2015   by Dr. Arlee   TEE WITHOUT CARDIOVERSION N/A 04/21/2022   Procedure: TRANSESOPHAGEAL ECHOCARDIOGRAM (TEE);  Surgeon: Shlomo Wilbert SAUNDERS, MD;  Location: [facility];  Service: Cardiovascular;  Laterality: N/A;    I have reviewed the social history and family history with the patient and they are unchanged from previous note.  ALLERGIES:  has no known allergies.  MEDICATIONS:  Current Outpatient Medications  Medication Sig Dispense Refill   acetaminophen  (TYLENOL ) 500 MG tablet Take 500 mg by mouth every 6 (six) hours as needed for moderate pain (pain score 4-6).     amiodarone  (PACERONE ) 200 MG tablet Take 1 tablet (200 mg total) by mouth daily. Take amiodarone  200 mg p.o. twice daily for 3 weeks starting from 06/08/2024 till 06/29/2024.  Then start taking amiodarone  200 mg p.o. daily 90 tablet 0   apixaban  (ELIQUIS ) 5 MG TABS tablet Take 1 tablet (5 mg total) by mouth 2 (two) times daily. 180 tablet 3   hydrochlorothiazide (HYDRODIURIL) 12.5 MG tablet Take 12.5 mg by mouth.     lidocaine -prilocaine  (EMLA ) cream Apply to affected area once 30 g 3   losartan  (COZAAR ) 25 MG tablet Take 1 tablet (25 mg total) by mouth daily. 30 tablet 2    metoprolol  tartrate (LOPRESSOR ) 25 MG tablet Take 1 tablet (25 mg total) by mouth 2 (two) times daily. 60 tablet 11   omeprazole (PRILOSEC OTC) 20 MG tablet Take 20 mg by mouth daily as needed (for heartburn).     ondansetron  (ZOFRAN ) 8 MG tablet Take 1 tablet (8 mg total) by mouth every 8 (eight) hours as needed for nausea or vomiting. Start on the third day after cisplatin . 30 tablet 1   prochlorperazine  (COMPAZINE ) 10 MG tablet Take 1 tablet (10 mg total) by mouth every 6 (six) hours as needed (Nausea or vomiting). 30 tablet 1   rosuvastatin  (CRESTOR ) 5 MG tablet Take 1 tablet by mouth once daily 90 tablet 1   No current facility-administered medications for this visit.    PHYSICAL EXAMINATION: ECOG PERFORMANCE STATUS: 1 - Symptomatic but completely ambulatory  There were no vitals filed for this visit. Wt Readings from Last 3 Encounters:  08/23/24 131 lb 8 oz (59.6 kg)  08/09/24 132 lb (59.9 kg)  08/09/24 132 lb (59.9 kg)     GENERAL:alert, no distress and comfortable SKIN: skin color, texture, turgor are normal, no rashes or significant lesions EYES: normal, Conjunctiva are pink and non-injected, sclera clear NECK: supple, thyroid normal size, non-tender, without nodularity LYMPH:  no palpable lymphadenopathy in the cervical, axillary  LUNGS: clear to auscultation and percussion with normal breathing effort HEART: regular rate & rhythm and no murmurs and no lower extremity edema ABDOMEN:abdomen soft, non-tender and normal bowel sounds Musculoskeletal:no cyanosis of digits and  no clubbing  NEURO: alert & oriented x 3 with fluent speech, no focal motor/sensory deficits  Physical Exam    LABORATORY DATA:  I have reviewed the data as listed    Latest Ref Rng & Units 08/22/2024   11:34 AM 08/04/2024   11:49 AM 06/12/2024    9:01 AM  CBC  WBC 4.0 - 10.5 K/uL 5.6  8.1  7.9   Hemoglobin 12.0 - 15.0 g/dL 86.9  86.2  87.7   Hematocrit 36.0 - 46.0 % 40.4  44.3  38.4    Platelets 150 - 400 K/uL 210  248  321         Latest Ref Rng & Units 08/22/2024   11:34 AM 08/04/2024   11:49 AM 06/13/2024    6:49 AM  CMP  Glucose 70 - 99 mg/dL 899  97  895   BUN 8 - 23 mg/dL 19  23  16    Creatinine 0.44 - 1.00 mg/dL 8.89  8.89  9.14   Sodium 135 - 145 mmol/L 140  142  138   Potassium 3.5 - 5.1 mmol/L 4.3  3.9  3.7   Chloride 98 - 111 mmol/L 97  101  87   CO2 22 - 32 mmol/L 29  29  36   Calcium  8.9 - 10.3 mg/dL 9.4  9.2  8.6   Total Protein 6.5 - 8.1 g/dL 7.7  7.6    Total Bilirubin 0.0 - 1.2 mg/dL 0.9  1.4    Alkaline Phos 38 - 126 U/L 72  85    AST 15 - 41 U/L 28  28    ALT 0 - 44 U/L 11  8        RADIOGRAPHIC STUDIES: I have personally reviewed the radiological images as listed and agreed with the findings in the report. No results found.    Orders Placed This Encounter  Procedures   CBC with Differential (Cancer Center Only)    Standing Status:   Future    Expected Date:   10/04/2024    Expiration Date:   10/04/2025   CMP (Cancer Center only)    Standing Status:   Future    Expected Date:   10/04/2024    Expiration Date:   10/04/2025   T4    Standing Status:   Future    Expected Date:   10/04/2024    Expiration Date:   10/04/2025   TSH    Standing Status:   Future    Expected Date:   10/04/2024    Expiration Date:   10/04/2025   Magnesium     Standing Status:   Future    Expected Date:   10/04/2024    Expiration Date:   10/04/2025   CBC with Differential (Cancer Center Only)    Standing Status:   Future    Expected Date:   10/18/2024    Expiration Date:   10/18/2025   CMP (Cancer Center only)    Standing Status:   Future    Expected Date:   10/18/2024    Expiration Date:   10/18/2025   Magnesium     Standing Status:   Future    Expected Date:   10/18/2024    Expiration Date:   10/18/2025   All questions were answered. The patient knows to call the clinic with any problems, questions or concerns. No barriers to learning was detected. The total time  spent in the appointment was 25 minutes, including review of chart and  various tests results, discussions about plan of care and coordination of care plan     Onita Mattock, MD 08/23/2024     "

## 2024-08-23 NOTE — Patient Instructions (Signed)
 [facility] - A DEPT OF [facility]  Discharge Instructions: Thank you for choosing [facility] to provide your oncology and hematology care.   If you have a lab appointment with the Cancer Center, please go directly to the Cancer Center and check in at the registration area.   Wear comfortable clothing and clothing appropriate for easy access to any Portacath or PICC line.   We strive to give you quality time with your provider. You may need to reschedule your appointment if you arrive late (15 or more minutes).  Arriving late affects you and other patients whose appointments are after yours.  Also, if you miss three or more appointments without notifying the office, you may be dismissed from the clinic at the providers discretion.      For prescription refill requests, have your pharmacy contact our office and allow 72 hours for refills to be completed.    Today you received the following chemotherapy and/or immunotherapy agents Gemzar  (Gemcitabine ), Cisplatin       To help prevent nausea and vomiting after your treatment, we encourage you to take your nausea medication as directed.  BELOW ARE SYMPTOMS THAT SHOULD BE REPORTED IMMEDIATELY: *FEVER GREATER THAN 100.4 F (38 C) OR HIGHER *CHILLS OR SWEATING *NAUSEA AND VOMITING THAT IS NOT CONTROLLED WITH YOUR NAUSEA MEDICATION *UNUSUAL SHORTNESS OF BREATH *UNUSUAL BRUISING OR BLEEDING *URINARY PROBLEMS (pain or burning when urinating, or frequent urination) *BOWEL PROBLEMS (unusual diarrhea, constipation, pain near the anus) TENDERNESS IN MOUTH AND THROAT WITH OR WITHOUT PRESENCE OF ULCERS (sore throat, sores in mouth, or a toothache) UNUSUAL RASH, SWELLING OR PAIN  UNUSUAL VAGINAL DISCHARGE OR ITCHING   Items with * indicate a potential emergency and should be followed up as soon as possible or go to the Emergency Department if any problems should occur.  Please show the CHEMOTHERAPY ALERT  CARD or IMMUNOTHERAPY ALERT CARD at check-in to the Emergency Department and triage nurse.  Should you have questions after your visit or need to cancel or reschedule your appointment, please contact [facility] - [facility] DE[facility]  Dept: [phone]  and follow the prompts.  Office hours are 8:00 a.m. to 4:30 p.m. Monday - Friday. Please note that voicemails left after 4:00 p.m. may not be returned until the following business day.  We are closed weekends and major holidays. You have access to a nurse at all times for urgent questions. Please call the main number to the clinic Dept: [phone] and follow the prompts.   For any non-urgent questions, you may also contact your provider using MyChart. We now offer e-Visits for anyone 84 and older to request care online for non-urgent symptoms. For details visit [email or web address].   Also download the MyChart app! Go to the app store, search MyChart, open the app, select [place], and log in with your MyChart username and password.

## 2024-08-24 ENCOUNTER — Other Ambulatory Visit: Payer: Self-pay

## 2024-08-25 ENCOUNTER — Other Ambulatory Visit: Payer: Self-pay

## 2024-08-29 ENCOUNTER — Inpatient Hospital Stay: Admitting: Nurse Practitioner

## 2024-08-29 ENCOUNTER — Encounter: Payer: Self-pay | Admitting: *Deleted

## 2024-08-29 ENCOUNTER — Inpatient Hospital Stay

## 2024-08-30 ENCOUNTER — Other Ambulatory Visit: Payer: Self-pay

## 2024-08-30 ENCOUNTER — Inpatient Hospital Stay

## 2024-09-02 NOTE — Addendum Note (Signed)
 Encounter addended by: Janice Lynwood BROCKS on: 09/02/2024 8:25 AM  Actions taken: Imaging Exam ended

## 2024-09-06 ENCOUNTER — Other Ambulatory Visit: Payer: Self-pay | Admitting: Nurse Practitioner

## 2024-09-06 NOTE — Assessment & Plan Note (Addendum)
 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  - Case reviewed in the tumor board, she is not a candidate for surgical resection due to the extensive disease -She started first-line chemotherapy cisplatin , gemcitabine  and durvalumab  on August 09, 2024, every 2 weeks. - Tolerating chemotherapy well with minimal side effects.  Proceed with cycle 2 cisplatin , gemcitabine , and durvalumab .  Continue with chemotherapy treatments as currently scheduled.

## 2024-09-06 NOTE — Progress Notes (Signed)
 " Patient Care Team: Pcp, No as PCP - General Gina Agent, MD as PCP - Cardiology (Cardiology) [name] (General Practice) [name], MD as Medical Oncologist (Medical Oncology) [name], RN as Oncology Nurse Navigator (Medical Oncology)  Clinic Day:  09/07/2024  Referring physician: Lanny Callander, MD  ASSESSMENT & PLAN:   Assessment & Plan: Intrahepatic cholangiocarcinoma [facility]) [id number] -Diagnosed in Oct 2025 after hospital admission, CT/MRI/PET showed 3.8 x 5.1 cm segment 4A hepatic mass, with additional 1cm hypermetabolic lesion in left lobe, and 1cm right Cardiophrenic Angle adenopathy, again suspicious for nodal metastasis.  - Case reviewed in the tumor board, she is not a candidate for surgical resection due to the extensive disease -She started first-line chemotherapy cisplatin , gemcitabine  and durvalumab  on August 09, 2024, every 2 weeks. - Tolerating chemotherapy well with minimal side effects.  Proceed with cycle 2 cisplatin , gemcitabine , and durvalumab .  Continue with chemotherapy treatments as currently scheduled.   Hypomagnesemia Chemotherapy-induced.  Magnesium  level is 1.5 today.  Patient receiving 2 g magnesium  intravenously during treatment today.  Monitor closely with every visit.  Intrahepatic cholangiocarcinoma Patient presenting for cycle 2 day 1 chemotherapy cisplatin , gemcitabine , and durvalumab .  Managing chemotherapy very well.  Having some mild constipation.  Taking MiraLAX .  She denies pelvic pain, nausea, vomiting, diarrhea.  She does have mild fatigue.  She reports no new symptoms or concerns.  Denies peripheral neuropathy.  He favors chemotherapy today as scheduled and continuing with current treatment plan. The patient understands the plans discussed today and is in agreement with them.  She knows to contact our office if she develops concerns prior to her next appointment.  Plan Patient seen in infusion today. Labs  reviewed. - CBC and CMP unremarkable. - Magnesium  1.5.  Patient received 2 g IV magnesium  during chemotherapy. Labs and patient presentation are appropriate for treatment today. Proceed with cycle 2 day 1 chemotherapy with cisplatin , gemcitabine , and durvalumab . Labs/flush, follow-up, and chemotherapy treatments as scheduled.  I provided 20 minutes of face-to-face time during this encounter and > 50% was spent counseling as documented under my assessment and plan.    Powell FORBES Lessen, NP  [facility] - A DEPT OF [facility] [address] [place] [address] Dept: [phone] Dept Fax: [phone]   No orders of the defined types were placed in this encounter.     CHIEF COMPLAINT:  CC: Intrahepatic cholangiocarcinoma  Current Treatment: First-line chemotherapy cisplatin , gemcitabine  every 2 weeks, and durvalumab  every 4 weeks  INTERVAL HISTORY:  Gina Duncan is here today for repeat clinical assessment.  She last saw Dr. Lanny on 08/23/2024.  She reports good management with therapy.  She denies peripheral neuropathy.  She does have mild fatigue.  Breast limited.  Able to complete pleat normal activities of daily living.  Sometimes requires little help.  Skin denies chest pain, chest pressure, or shortness of breath. She denies headaches or visual disturbances. She denies abdominal pain, nausea, vomiting, or changes in bowel or bladder habits.  Experience mild constipation.  She is taking MiraLAX  daily to manage this symptom.  She denies fevers or chills. She denies pain. Her appetite is good. Her weight has been stable.  I have reviewed the past medical history, past surgical history, social history and family history with the patient and they are unchanged from previous note.  ALLERGIES:  has no known allergies.  MEDICATIONS:  Current Outpatient Medications  Medication  Sig Dispense Refill   acetaminophen  (TYLENOL ) 500  MG tablet Take 500 mg by mouth every 6 (six) hours as needed for moderate pain (pain score 4-6).     apixaban  (ELIQUIS ) 5 MG TABS tablet Take 1 tablet (5 mg total) by mouth 2 (two) times daily. 180 tablet 3   hydrochlorothiazide (HYDRODIURIL) 12.5 MG tablet Take 12.5 mg by mouth.     lidocaine -prilocaine  (EMLA ) cream Apply to affected area once 30 g 3   losartan  (COZAAR ) 50 MG tablet Take 50 mg by mouth in the morning and at bedtime.     metoprolol  tartrate (LOPRESSOR ) 25 MG tablet Take 1 tablet (25 mg total) by mouth 2 (two) times daily. 60 tablet 11   omeprazole (PRILOSEC OTC) 20 MG tablet Take 20 mg by mouth daily as needed (for heartburn).     ondansetron  (ZOFRAN ) 8 MG tablet Take 1 tablet (8 mg total) by mouth every 8 (eight) hours as needed for nausea or vomiting. Start on the third day after cisplatin . 30 tablet 1   prochlorperazine  (COMPAZINE ) 10 MG tablet Take 1 tablet (10 mg total) by mouth every 6 (six) hours as needed (Nausea or vomiting). 30 tablet 1   rosuvastatin  (CRESTOR ) 5 MG tablet Take 1 tablet by mouth once daily 90 tablet 1   No current facility-administered medications for this visit.    HISTORY OF PRESENT ILLNESS:   Oncology History  Intrahepatic cholangiocarcinoma (HCC)  06/13/2024 Cancer Staging   Staging form: Intrahepatic Bile Duct, AJCC 8th Edition - Clinical stage from 06/13/2024: Stage IIIB (cT1b, cN1, cM0) - Signed by Lanny Callander, MD on 08/23/2024   07/19/2024 Initial Diagnosis   Intrahepatic cholangiocarcinoma (HCC)   08/09/2024 -  Chemotherapy   Patient is on Treatment Plan : BILIARY TRACT Cisplatin  + Gemcitabine  D1,8 + Durvalumab  (1500) D1 q21d / Durvalumab  (1500) q28d         REVIEW OF SYSTEMS:   Constitutional: Denies fevers, chills or abnormal weight loss. Mild fatigue.  Eyes: Denies blurriness of vision Ears, nose, mouth, throat, and face: Denies mucositis or sore throat Respiratory: Denies cough, dyspnea or wheezes Cardiovascular: Denies  palpitation, chest discomfort or lower extremity swelling Gastrointestinal:  Denies nausea, heartburn or change in bowel habits. Mild constipation.   Skin: Denies abnormal skin rashes Lymphatics: Denies new lymphadenopathy or easy bruising Neurological:Denies numbness, tingling or new weaknesses Behavioral/Psych: Mood is stable, no new changes  All other systems were reviewed with the patient and are negative.   VITALS:   Today's Vitals   09/07/24 0745  BP: (!) 172/64  Pulse: 64  Resp: 17  Temp: 97.6 F (36.4 C)  SpO2: 97%  Weight: 135 lb 4 oz (61.3 kg)   Body mass index is 22.51 kg/m.   Wt Readings from Last 3 Encounters:  09/20/24 134 lb 12.8 oz (61.1 kg)  09/14/24 136 lb 6.4 oz (61.9 kg)  09/07/24 135 lb 4 oz (61.3 kg)    Body mass index is 22.51 kg/m.  Performance status (ECOG): 2 - Symptomatic, <50% confined to bed  PHYSICAL EXAM:   GENERAL:alert, no distress and comfortable SKIN: skin color, texture, turgor are normal, no rashes or significant lesions EYES: normal, Conjunctiva are pink and non-injected, sclera clear OROPHARYNX:no exudate, no erythema and lips, buccal mucosa, and tongue normal  NECK: supple, thyroid  normal size, non-tender, without nodularity LYMPH:  no palpable lymphadenopathy in the cervical, axillary or inguinal LUNGS: clear to auscultation and percussion with normal breathing effort HEART: regular rate & rhythm and  no murmurs and no lower extremity edema ABDOMEN:abdomen soft, non-tender and normal bowel sounds Musculoskeletal:no cyanosis of digits and no clubbing  NEURO: alert & oriented x 3 with fluent speech, no focal motor/sensory deficits  LABORATORY DATA:  I have reviewed the data as listed    Component Value Date/Time   NA 140 09/20/2024 0927   NA 142 04/29/2022 1111   K 4.4 09/20/2024 0927   CL 99 09/20/2024 0927   CO2 32 09/20/2024 0927   GLUCOSE 107 (H) 09/20/2024 0927   BUN 18 09/20/2024 0927   BUN 17 04/29/2022 1111    CREATININE 1.08 (H) 09/20/2024 0927   CALCIUM  9.4 09/20/2024 0927   PROT 7.4 09/20/2024 0927   ALBUMIN 4.3 09/20/2024 0927   AST 29 09/20/2024 0927   ALT 9 09/20/2024 0927   ALKPHOS 78 09/20/2024 0927   BILITOT 0.7 09/20/2024 0927   GFRNONAA 51 (L) 09/20/2024 0927    Lab Results  Component Value Date   WBC 6.4 09/20/2024   NEUTROABS 4.0 09/20/2024   HGB 11.4 (L) 09/20/2024   HCT 34.6 (L) 09/20/2024   MCV 96.1 09/20/2024   PLT 156 09/20/2024    "

## 2024-09-07 ENCOUNTER — Inpatient Hospital Stay: Admitting: Nurse Practitioner

## 2024-09-07 ENCOUNTER — Inpatient Hospital Stay

## 2024-09-07 ENCOUNTER — Inpatient Hospital Stay: Attending: Hematology

## 2024-09-07 VITALS — BP 172/64 | HR 64 | Temp 97.6°F | Resp 17 | Wt 135.2 lb

## 2024-09-07 VITALS — BP 172/64 | HR 64 | Temp 97.6°F | Resp 14 | Ht 65.0 in | Wt 135.2 lb

## 2024-09-07 DIAGNOSIS — Z5112 Encounter for antineoplastic immunotherapy: Secondary | ICD-10-CM | POA: Insufficient documentation

## 2024-09-07 DIAGNOSIS — D6481 Anemia due to antineoplastic chemotherapy: Secondary | ICD-10-CM | POA: Diagnosis not present

## 2024-09-07 DIAGNOSIS — C221 Intrahepatic bile duct carcinoma: Secondary | ICD-10-CM | POA: Diagnosis not present

## 2024-09-07 DIAGNOSIS — T451X5A Adverse effect of antineoplastic and immunosuppressive drugs, initial encounter: Secondary | ICD-10-CM | POA: Insufficient documentation

## 2024-09-07 DIAGNOSIS — Z5111 Encounter for antineoplastic chemotherapy: Secondary | ICD-10-CM | POA: Diagnosis present

## 2024-09-07 LAB — CMP (CANCER CENTER ONLY)
ALT: 11 U/L (ref 0–44)
AST: 29 U/L (ref 15–41)
Albumin: 4.3 g/dL (ref 3.5–5.0)
Alkaline Phosphatase: 75 U/L (ref 38–126)
Anion gap: 10 (ref 5–15)
BUN: 18 mg/dL (ref 8–23)
CO2: 30 mmol/L (ref 22–32)
Calcium: 9.4 mg/dL (ref 8.9–10.3)
Chloride: 100 mmol/L (ref 98–111)
Creatinine: 1.02 mg/dL — ABNORMAL HIGH (ref 0.44–1.00)
GFR, Estimated: 55 mL/min — ABNORMAL LOW
Glucose, Bld: 97 mg/dL (ref 70–99)
Potassium: 4.5 mmol/L (ref 3.5–5.1)
Sodium: 141 mmol/L (ref 135–145)
Total Bilirubin: 0.9 mg/dL (ref 0.0–1.2)
Total Protein: 7.5 g/dL (ref 6.5–8.1)

## 2024-09-07 LAB — CBC WITH DIFFERENTIAL (CANCER CENTER ONLY)
Abs Immature Granulocytes: 0.02 K/uL (ref 0.00–0.07)
Basophils Absolute: 0.1 K/uL (ref 0.0–0.1)
Basophils Relative: 1 %
Eosinophils Absolute: 0 K/uL (ref 0.0–0.5)
Eosinophils Relative: 0 %
HCT: 36.4 % (ref 36.0–46.0)
Hemoglobin: 12.1 g/dL (ref 12.0–15.0)
Immature Granulocytes: 0 %
Lymphocytes Relative: 18 %
Lymphs Abs: 1.2 K/uL (ref 0.7–4.0)
MCH: 31.8 pg (ref 26.0–34.0)
MCHC: 33.2 g/dL (ref 30.0–36.0)
MCV: 95.5 fL (ref 80.0–100.0)
Monocytes Absolute: 0.7 K/uL (ref 0.1–1.0)
Monocytes Relative: 10 %
Neutro Abs: 4.6 K/uL (ref 1.7–7.7)
Neutrophils Relative %: 71 %
Platelet Count: 219 K/uL (ref 150–400)
RBC: 3.81 MIL/uL — ABNORMAL LOW (ref 3.87–5.11)
RDW: 15 % (ref 11.5–15.5)
WBC Count: 6.5 K/uL (ref 4.0–10.5)
nRBC: 0 % (ref 0.0–0.2)

## 2024-09-07 LAB — MAGNESIUM: Magnesium: 1.5 mg/dL — ABNORMAL LOW (ref 1.7–2.4)

## 2024-09-07 MED ORDER — SODIUM CHLORIDE 0.9 % IV SOLN
25.0000 mg/m2 | Freq: Once | INTRAVENOUS | Status: AC
  Administered 2024-09-07: 42 mg via INTRAVENOUS
  Filled 2024-09-07: qty 42

## 2024-09-07 MED ORDER — PALONOSETRON HCL INJECTION 0.25 MG/5ML
0.2500 mg | Freq: Once | INTRAVENOUS | Status: AC
  Administered 2024-09-07: 0.25 mg via INTRAVENOUS
  Filled 2024-09-07: qty 5

## 2024-09-07 MED ORDER — MAGNESIUM SULFATE 2 GM/50ML IV SOLN
2.0000 g | Freq: Once | INTRAVENOUS | Status: AC
  Administered 2024-09-07: 2 g via INTRAVENOUS
  Filled 2024-09-07: qty 50

## 2024-09-07 MED ORDER — SODIUM CHLORIDE 0.9 % IV SOLN
1500.0000 mg | Freq: Once | INTRAVENOUS | Status: AC
  Administered 2024-09-07: 1500 mg via INTRAVENOUS
  Filled 2024-09-07: qty 30

## 2024-09-07 MED ORDER — POTASSIUM CHLORIDE IN NACL 20-0.9 MEQ/L-% IV SOLN
Freq: Once | INTRAVENOUS | Status: AC
  Filled 2024-09-07: qty 1000

## 2024-09-07 MED ORDER — SODIUM CHLORIDE 0.9 % IV SOLN
1000.0000 mg/m2 | Freq: Once | INTRAVENOUS | Status: AC
  Administered 2024-09-07: 1672 mg via INTRAVENOUS
  Filled 2024-09-07: qty 43.97

## 2024-09-07 MED ORDER — APREPITANT 130 MG/18ML IV EMUL
130.0000 mg | Freq: Once | INTRAVENOUS | Status: AC
  Administered 2024-09-07: 130 mg via INTRAVENOUS
  Filled 2024-09-07: qty 18

## 2024-09-07 MED ORDER — SODIUM CHLORIDE 0.9 % IV SOLN: INTRAVENOUS | Status: DC

## 2024-09-07 NOTE — Patient Instructions (Signed)
 [facility] - A DEPT OF [place]  Discharge Instructions: Thank you for choosing [facility] to provide your oncology and hematology care.   If you have a lab appointment with the Cancer Center, please go directly to the Cancer Center and check in at the registration area.   Wear comfortable clothing and clothing appropriate for easy access to any Portacath or PICC line.   We strive to give you quality time with your provider. You may need to reschedule your appointment if you arrive late (15 or more minutes).  Arriving late affects you and other patients whose appointments are after yours.  Also, if you miss three or more appointments without notifying the office, you may be dismissed from the clinic at the providers discretion.      For prescription refill requests, have your pharmacy contact our office and allow 72 hours for refills to be completed.    Today you received the following chemotherapy and/or immunotherapy agents: Imfinzi . Gemzar , & Cisplatin       To help prevent nausea and vomiting after your treatment, we encourage you to take your nausea medication as directed.  BELOW ARE SYMPTOMS THAT SHOULD BE REPORTED IMMEDIATELY: *FEVER GREATER THAN 100.4 F (38 C) OR HIGHER *CHILLS OR SWEATING *NAUSEA AND VOMITING THAT IS NOT CONTROLLED WITH YOUR NAUSEA MEDICATION *UNUSUAL SHORTNESS OF BREATH *UNUSUAL BRUISING OR BLEEDING *URINARY PROBLEMS (pain or burning when urinating, or frequent urination) *BOWEL PROBLEMS (unusual diarrhea, constipation, pain near the anus) TENDERNESS IN MOUTH AND THROAT WITH OR WITHOUT PRESENCE OF ULCERS (sore throat, sores in mouth, or a toothache) UNUSUAL RASH, SWELLING OR PAIN  UNUSUAL VAGINAL DISCHARGE OR ITCHING   Items with * indicate a potential emergency and should be followed up as soon as possible or go to the Emergency Department if any problems should occur.  Please show the CHEMOTHERAPY ALERT  CARD or IMMUNOTHERAPY ALERT CARD at check-in to the Emergency Department and triage nurse.  Should you have questions after your visit or need to cancel or reschedule your appointment, please contact [facility] - A DEPT OF [facility]  Dept: [phone]  and follow the prompts.  Office hours are 8:00 a.m. to 4:30 p.m. Monday - Friday. Please note that voicemails left after 4:00 p.m. may not be returned until the following business day.  We are closed weekends and major holidays. You have access to a nurse at all times for urgent questions. Please call the main number to the clinic Dept: [phone] and follow the prompts.   For any non-urgent questions, you may also contact your provider using MyChart. We now offer e-Visits for anyone 82 and older to request care online for non-urgent symptoms. For details visit [email or web address].   Also download the MyChart app! Go to the app store, search MyChart, open the app, select [place], and log in with your MyChart username and password.

## 2024-09-07 NOTE — Progress Notes (Signed)
 Per Powell NP OK to proceed with elevated BP today, Pt asymptomatic.

## 2024-09-12 NOTE — Progress Notes (Unsigned)
 " Cardiology Office Note:   Date:  09/14/2024  ID:  Gina Duncan, Gina Duncan 19-Aug-1941, MRN [id number] PCP: Freddrick Johns  [facility] Providers Cardiologist:  Lynwood Schilling, MD {  History of Present Illness:   Gina Duncan. Gina Duncan is a 84 y.o. female who presents for follow up of coronary artery disease and atrial fibrillation.  She was admitted from the emergency department on 04/18/2022 and discharged on 04/22/2022.  She underwent TEE/DCCV on 04/21/2022.  She was converted to sinus rhythm.  Her echocardiogram showed severely dilated left atria, mitral valve regurgitation with mild-moderate MAC and trivial mitral regurgitation.  She was also noted to have aortic valve stenosis.  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  [facility] Rot[facility] and DES stenting to her mid LAD in 2017.     Since I last saw her she was in the hospital in October with suspected pneumonia and sepsis and shock.  She had respiratory failure.  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  She is now getting therapy with cisplatin  plus gemcitabine  plus Durvalumab .  She did have paroxysmal atrial fibrillation and was treated with amiodarone  in the hospital.  Of note her ejection fraction was greater than 75%.  She had moderate tricuspid regurgitation.  Remarkably she has been doing quite well.  She tolerates chemotherapy.  She stays active at home.  She is doing a lot of household chores.  She denies any chest pressure, neck or arm discomfort.  She has had no palpitations, presyncope or syncope.  She has had no weight gain other than by eating a little better.  She has had no new edema.    Of note her blood pressure is elevated today but at home they take her routinely with a reliable cuff and it is well-controlled.  I see readings that are very normal recently.  She has had her dose of Cozaar  increased.  It had to be reduced  previously because of hypotension in the hospital.     ROS: As stated in the HPI and negative for all other systems.  Studies Reviewed:    EKG:   EKG Interpretation Date/Time:  Wednesday September 14 2024 15:52:27 EST Ventricular Rate:  81 PR Interval:  292 QRS Duration:  100 QT Interval:  414 QTC Calculation: 480 R Axis:   70  Text Interpretation: Sinus rhythm Incomplete right bundle branch block Poor anterior R wave progression When compared with ECG of 08-Jun-2024 06:45, No significant change since last tracing Confirmed by Schilling Rattan ([id number]) on 09/14/2024 4:19:23 PM    Risk Assessment/Calculations:    CHA2DS2-VASc Score = 6   This indicates a 9.7% annual risk of stroke. The patient's score is based upon: CHF History: 1 HTN History: 1 Diabetes History: 0 Stroke History: 0 Vascular Disease History: 1 Age Score: 2 Gender Score: 1    Physical Exam:   VS:  BP (!) 160/79 (BP Location: Left Arm, Patient Position: Sitting, Cuff Size: Normal)   Pulse 69   Resp (!) 96   Ht 5' 5 (1.651 m)   Wt 136 lb 6.4 oz (61.9 kg)   SpO2 (!) 89%   BMI 22.70 kg/m    Wt Readings from Last 3 Encounters:  09/14/24 136 lb 6.4 oz (61.9 kg)  09/07/24 135 lb 4 oz (61.3 kg)  08/23/24 131 lb 8 oz (59.6 kg)     GEN: Well nourished, well  developed in no acute distress NECK: No JVD; No carotid bruits CARDIAC: RRR,  soft left sternal boarder systolic, no diastolic murmurs, rubs, gallops RESPIRATORY:  Clear to auscultation without rales, wheezing or rhonchi  ABDOMEN: Soft, non-tender, non-distended EXTREMITIES:  No edema; No deformity   ASSESSMENT AND PLAN:   Atrial fibrillation    The  patient is had no symptomatic recurrence.  I think with her current ongoing cancer I would like her to be off the amiodarone .  She remains on anticoagulation..   Acute systolic CHF: Seems to be euvolemic.  She did have volume overload in the hospital but currently does not need a change in therapy or  increased diuretics.   Hyperlipidemia:  LDL was 21 although this was a while ago.  I will consider repeat in the future though with her ongoing struggles awaiting to if she is through chemotherapy and recovered.   Hypertension:   Her blood pressure is very elevated but came down quickly when repeated in the office.  It is controlled at home and there are good readings recently posted to chemo.  For now she will continue the meds as listed and keep a blood pressure diary.    CAD:   She has no new symptoms.  No change in therapy.    Follow up with me in 6 months.  Signed, Lynwood Schilling, MD   "

## 2024-09-13 ENCOUNTER — Inpatient Hospital Stay

## 2024-09-13 ENCOUNTER — Inpatient Hospital Stay: Admitting: Hematology

## 2024-09-14 ENCOUNTER — Ambulatory Visit: Admitting: Cardiology

## 2024-09-14 ENCOUNTER — Encounter: Payer: Self-pay | Admitting: Cardiology

## 2024-09-14 VITALS — BP 160/79 | HR 69 | Resp 96 | Ht 65.0 in | Wt 136.4 lb

## 2024-09-14 DIAGNOSIS — I10 Essential (primary) hypertension: Secondary | ICD-10-CM | POA: Diagnosis not present

## 2024-09-14 DIAGNOSIS — I5021 Acute systolic (congestive) heart failure: Secondary | ICD-10-CM

## 2024-09-14 DIAGNOSIS — I480 Paroxysmal atrial fibrillation: Secondary | ICD-10-CM

## 2024-09-14 DIAGNOSIS — I2510 Atherosclerotic heart disease of native coronary artery without angina pectoris: Secondary | ICD-10-CM | POA: Diagnosis not present

## 2024-09-14 DIAGNOSIS — E785 Hyperlipidemia, unspecified: Secondary | ICD-10-CM | POA: Diagnosis not present

## 2024-09-14 NOTE — Patient Instructions (Addendum)
 Medication Instructions:  Your physician has recommended you make the following change in your medication:  Stop amiodarone  Continue all other medications as prescribed  Labwork: none  Testing/Procedures: none  Follow-Up: Your physician recommends that you schedule a follow-up appointment in: 6 months  Any Other Special Instructions Will Be Listed Below (If Applicable).  If you need a refill on your cardiac medications before your next appointment, please call your pharmacy.

## 2024-09-19 ENCOUNTER — Other Ambulatory Visit: Payer: Self-pay

## 2024-09-19 MED ORDER — METOPROLOL TARTRATE 25 MG PO TABS: 25.0000 mg | ORAL_TABLET | Freq: Two times a day (BID) | ORAL | 11 refills | Status: AC

## 2024-09-20 ENCOUNTER — Inpatient Hospital Stay

## 2024-09-20 ENCOUNTER — Inpatient Hospital Stay: Admitting: Hematology

## 2024-09-20 VITALS — BP 204/86 | HR 68 | Temp 97.4°F | Resp 17 | Ht 65.0 in | Wt 134.8 lb

## 2024-09-20 VITALS — BP 149/60 | HR 60

## 2024-09-20 DIAGNOSIS — C221 Intrahepatic bile duct carcinoma: Secondary | ICD-10-CM

## 2024-09-20 DIAGNOSIS — Z5111 Encounter for antineoplastic chemotherapy: Secondary | ICD-10-CM | POA: Diagnosis not present

## 2024-09-20 LAB — CMP (CANCER CENTER ONLY)
ALT: 9 U/L (ref 0–44)
AST: 29 U/L (ref 15–41)
Albumin: 4.3 g/dL (ref 3.5–5.0)
Alkaline Phosphatase: 78 U/L (ref 38–126)
Anion gap: 9 (ref 5–15)
BUN: 18 mg/dL (ref 8–23)
CO2: 32 mmol/L (ref 22–32)
Calcium: 9.4 mg/dL (ref 8.9–10.3)
Chloride: 99 mmol/L (ref 98–111)
Creatinine: 1.08 mg/dL — ABNORMAL HIGH (ref 0.44–1.00)
GFR, Estimated: 51 mL/min — ABNORMAL LOW
Glucose, Bld: 107 mg/dL — ABNORMAL HIGH (ref 70–99)
Potassium: 4.4 mmol/L (ref 3.5–5.1)
Sodium: 140 mmol/L (ref 135–145)
Total Bilirubin: 0.7 mg/dL (ref 0.0–1.2)
Total Protein: 7.4 g/dL (ref 6.5–8.1)

## 2024-09-20 LAB — CBC WITH DIFFERENTIAL (CANCER CENTER ONLY)
Abs Immature Granulocytes: 0.03 K/uL (ref 0.00–0.07)
Basophils Absolute: 0.1 K/uL (ref 0.0–0.1)
Basophils Relative: 1 %
Eosinophils Absolute: 0 K/uL (ref 0.0–0.5)
Eosinophils Relative: 0 %
HCT: 34.6 % — ABNORMAL LOW (ref 36.0–46.0)
Hemoglobin: 11.4 g/dL — ABNORMAL LOW (ref 12.0–15.0)
Immature Granulocytes: 1 %
Lymphocytes Relative: 22 %
Lymphs Abs: 1.4 K/uL (ref 0.7–4.0)
MCH: 31.7 pg (ref 26.0–34.0)
MCHC: 32.9 g/dL (ref 30.0–36.0)
MCV: 96.1 fL (ref 80.0–100.0)
Monocytes Absolute: 0.9 K/uL (ref 0.1–1.0)
Monocytes Relative: 15 %
Neutro Abs: 4 K/uL (ref 1.7–7.7)
Neutrophils Relative %: 61 %
Platelet Count: 156 K/uL (ref 150–400)
RBC: 3.6 MIL/uL — ABNORMAL LOW (ref 3.87–5.11)
RDW: 15.2 % (ref 11.5–15.5)
WBC Count: 6.4 K/uL (ref 4.0–10.5)
nRBC: 0 % (ref 0.0–0.2)

## 2024-09-20 LAB — MAGNESIUM: Magnesium: 1.7 mg/dL (ref 1.7–2.4)

## 2024-09-20 MED ORDER — MAGNESIUM SULFATE 2 GM/50ML IV SOLN
2.0000 g | Freq: Once | INTRAVENOUS | Status: AC
  Administered 2024-09-20: 2 g via INTRAVENOUS
  Filled 2024-09-20: qty 50

## 2024-09-20 MED ORDER — APREPITANT 130 MG/18ML IV EMUL
130.0000 mg | Freq: Once | INTRAVENOUS | Status: AC
  Administered 2024-09-20: 130 mg via INTRAVENOUS
  Filled 2024-09-20 (×2): qty 18

## 2024-09-20 MED ORDER — SODIUM CHLORIDE 0.9 % IV SOLN: INTRAVENOUS | Status: DC

## 2024-09-20 MED ORDER — POTASSIUM CHLORIDE IN NACL 20-0.9 MEQ/L-% IV SOLN
Freq: Once | INTRAVENOUS | Status: AC
  Filled 2024-09-20: qty 1000

## 2024-09-20 MED ORDER — DEXAMETHASONE SOD PHOSPHATE PF 10 MG/ML IJ SOLN
10.0000 mg | Freq: Once | INTRAMUSCULAR | Status: AC
  Administered 2024-09-20: 10 mg via INTRAVENOUS
  Filled 2024-09-20 (×2): qty 1

## 2024-09-20 MED ORDER — PALONOSETRON HCL INJECTION 0.25 MG/5ML
0.2500 mg | Freq: Once | INTRAVENOUS | Status: AC
  Administered 2024-09-20: 0.25 mg via INTRAVENOUS
  Filled 2024-09-20 (×2): qty 5

## 2024-09-20 MED ORDER — SODIUM CHLORIDE 0.9 % IV SOLN
1000.0000 mg/m2 | Freq: Once | INTRAVENOUS | Status: AC
  Administered 2024-09-20: 1672 mg via INTRAVENOUS
  Filled 2024-09-20: qty 43.97

## 2024-09-20 MED ORDER — SODIUM CHLORIDE 0.9 % IV SOLN
25.0000 mg/m2 | Freq: Once | INTRAVENOUS | Status: AC
  Administered 2024-09-20: 42 mg via INTRAVENOUS
  Filled 2024-09-20: qty 42

## 2024-09-20 NOTE — Progress Notes (Signed)
 " [facility]   [phone] [phone]   Clinic Follow up Note   Patient Care Team: Pcp, No as PCP - General Lavona Agent, MD as PCP - Cardiology (Cardiology) [name] (General Practice) [name], MD as Medical Oncologist (Medical Oncology) [name], RN as Oncology Nurse Navigator (Medical Oncology)  Date of Service:  09/20/2024  CHIEF COMPLAINT: f/u of cholangiocarcinoma  CURRENT THERAPY:  First-line chemotherapy cisplatin , gemcitabine  and durvalumab   Oncology History   Intrahepatic cholangiocarcinoma (HCC) cT1bN1M0, MSS, TMB 2, BAP1 mutation (+) and MTAP deletion (+) -Diagnosed in Oct 2025 after hospital admission, CT/MRI/PET showed 3.8 x 5.1 cm segment 4A hepatic mass, with additional 1cm hypermetabolic lesion in left lobe, and 1cm right Cardiophrenic Angle adenopathy, again suspicious for nodal metastasis.  - Case reviewed in the tumor board, she is not a candidate for surgical resection due to the extensive disease -She started first-line chemotherapy cisplatin , gemcitabine  and durvalumab  on August 09, 2024, every 2 weeks.  Assessment & Plan Unresectable intrahepatic cholangiocarcinoma Undergoing active chemotherapy for intrahepatic cholangiocarcinoma with good tolerance and no significant gastrointestinal or other acute complications. Currently receiving the fourth cycle of chemotherapy, with a total of six months planned before transitioning to maintenance immunotherapy. Ongoing clinical and laboratory monitoring to assess response and toxicity. - Administered fourth cycle of chemotherapy. - Ordered CT scan to assess treatment response in approximately three weeks (around February 10). - Plan to continue chemotherapy for six months, then transition to immunotherapy. - Scheduled follow-up in two weeks.  Chemotherapy-induced anemia Mild anemia secondary to chemotherapy, with hemoglobin >11 g/dL. Asymptomatic  and no transfusion indicated. Anemia expected as a side effect of chemotherapy. - Advised to maintain a balanced diet including protein and vegetables. - Recommended multivitamin supplementation. - Monitor hemoglobin; transfuse only if hemoglobin <8 g/dL.  Hypomagnesemia Previously documented hypomagnesemia. Oral magnesium  supplementation had been discontinued by primary care, but she has it available at home. Asymptomatic. - Advised to resume oral magnesium  supplementation.  Plan - Patient is tolerating chemotherapy well, lab reviewed, adequate for treatment will proceed with cycle 4 today - Plan to repeat CT scan in 3 weeks - Up in 2 weeks before next cycle chemo   SUMMARY OF ONCOLOGIC HISTORY: Oncology History  Intrahepatic cholangiocarcinoma (HCC)  06/13/2024 Cancer Staging   Staging form: Intrahepatic Bile Duct, AJCC 8th Edition - Clinical stage from 06/13/2024: Stage IIIB (cT1b, cN1, cM0) - Signed by Lanny Callander, MD on 08/23/2024   07/19/2024 Initial Diagnosis   Intrahepatic cholangiocarcinoma (HCC)   08/09/2024 -  Chemotherapy   Patient is on Treatment Plan : BILIARY TRACT Cisplatin  + Gemcitabine  D1,8 + Durvalumab  (1500) D1 q21d / Durvalumab  (1500) q28d        Discussed the use of AI scribe software for clinical note transcription with the patient, who gave verbal consent to proceed.  History of Present Illness Gina Duncan is an 84 year old female with intrahepatic cholangiocarcinoma who presents for chemotherapy follow-up and infusion.  She tolerated the last infusion well without significant fatigue, gastrointestinal symptoms, or need for antiemetics. She denies abdominal pain, bloating, nausea, or poor appetite.  She had a brief episode of constipation that resolved with polyethylene glycol, and bowel habits are now normal. She is monitoring for recurrence, aware of the potential contribution of antiemetics.  She denies abnormal bleeding, bruising, or significant  fatigue and has not needed transfusion.  Her blood pressure is elevated today, but she reports taking antihypertensives as prescribed and denies  headache or chest pain. She previously had hypomagnesemia and has oral magnesium  available if needed. She reports no new symptoms or medication needs.     All other systems were reviewed with the patient and are negative.  MEDICAL HISTORY:  Past Medical History:  Diagnosis Date   CAD (coronary artery disease)    DES to LAD 2017   Endometrial cancer (HCC)    Hypertension    Radiation 06/01/15, 06/12/15, 06/15/15, 06/19/15, 06/21/15   proximal vagina 30 gray    SURGICAL HISTORY: Past Surgical History:  Procedure Laterality Date   ABDOMINAL HYSTERECTOMY     cardiac stents  2017   CARDIOVERSION N/A 04/21/2022   Procedure: CARDIOVERSION;  Surgeon: Shlomo Wilbert SAUNDERS, MD;  Location: [facility] ENDOSCOPY;  Service: Cardiovascular;  Laterality: N/A;   CHOLECYSTECTOMY     IR IMAGING GUIDED PORT INSERTION  08/02/2024   ROBOTIC ASSISTED TOTAL HYSTERECTOMY WITH BILATERAL SALPINGO OOPHERECTOMY  02/13/2015   by Dr. Arlee   TEE WITHOUT CARDIOVERSION N/A 04/21/2022   Procedure: TRANSESOPHAGEAL ECHOCARDIOGRAM (TEE);  Surgeon: Shlomo Wilbert SAUNDERS, MD;  Location: [facility] ENDOSCOPY;  Service: Cardiovascular;  Laterality: N/A;    I have reviewed the social history and family history with the patient and they are unchanged from previous note.  ALLERGIES:  has no known allergies.  MEDICATIONS:  Current Outpatient Medications  Medication Sig Dispense Refill   acetaminophen  (TYLENOL ) 500 MG tablet Take 500 mg by mouth every 6 (six) hours as needed for moderate pain (pain score 4-6).     apixaban  (ELIQUIS ) 5 MG TABS tablet Take 1 tablet (5 mg total) by mouth 2 (two) times daily. 180 tablet 3   hydrochlorothiazide (HYDRODIURIL) 12.5 MG tablet Take 12.5 mg by mouth.     lidocaine -prilocaine  (EMLA ) cream Apply to affected area once 30 g 3   losartan  (COZAAR ) 50 MG tablet Take  50 mg by mouth in the morning and at bedtime.     metoprolol  tartrate (LOPRESSOR ) 25 MG tablet Take 1 tablet (25 mg total) by mouth 2 (two) times daily. 60 tablet 11   omeprazole (PRILOSEC OTC) 20 MG tablet Take 20 mg by mouth daily as needed (for heartburn).     ondansetron  (ZOFRAN ) 8 MG tablet Take 1 tablet (8 mg total) by mouth every 8 (eight) hours as needed for nausea or vomiting. Start on the third day after cisplatin . 30 tablet 1   prochlorperazine  (COMPAZINE ) 10 MG tablet Take 1 tablet (10 mg total) by mouth every 6 (six) hours as needed (Nausea or vomiting). 30 tablet 1   rosuvastatin  (CRESTOR ) 5 MG tablet Take 1 tablet by mouth once daily 90 tablet 1   No current facility-administered medications for this visit.   Facility-Administered Medications Ordered in Other Visits  Medication Dose Route Frequency Provider Last Rate Last Admin   0.9 %  sodium chloride  infusion   Intravenous Continuous Lanny Callander, MD   Stopped at 09/20/24 1445    PHYSICAL EXAMINATION: ECOG PERFORMANCE STATUS: 1 - Symptomatic but completely ambulatory  Vitals:   09/20/24 0900 09/20/24 0956  BP: (!) 204/77 (!) 204/86  Pulse: 68   Resp: 17   Temp: (!) 97.4 F (36.3 C)   SpO2: 97%    Wt Readings from Last 3 Encounters:  09/20/24 134 lb 12.8 oz (61.1 kg)  09/14/24 136 lb 6.4 oz (61.9 kg)  09/07/24 135 lb 4 oz (61.3 kg)     GENERAL:alert, no distress and comfortable SKIN: skin color, texture, turgor are normal, no rashes or  significant lesions EYES: normal, Conjunctiva are pink and non-injected, sclera clear NECK: supple, thyroid  normal size, non-tender, without nodularity LYMPH:  no palpable lymphadenopathy in the cervical, axillary  LUNGS: clear to auscultation and percussion with normal breathing effort HEART: regular rate & rhythm and no murmurs and no lower extremity edema ABDOMEN:abdomen soft, non-tender and normal bowel sounds Musculoskeletal:no cyanosis of digits and no clubbing  NEURO: alert  & oriented x 3 with fluent speech, no focal motor/sensory deficits  Physical Exam    LABORATORY DATA:  I have reviewed the data as listed    Latest Ref Rng & Units 09/20/2024    9:27 AM 09/07/2024    7:34 AM 08/22/2024   11:34 AM  CBC  WBC 4.0 - 10.5 K/uL 6.4  6.5  5.6   Hemoglobin 12.0 - 15.0 g/dL 88.5  87.8  86.9   Hematocrit 36.0 - 46.0 % 34.6  36.4  40.4   Platelets 150 - 400 K/uL 156  219  210         Latest Ref Rng & Units 09/20/2024    9:27 AM 09/07/2024    7:34 AM 08/22/2024   11:34 AM  CMP  Glucose 70 - 99 mg/dL 892  97  899   BUN 8 - 23 mg/dL 18  18  19    Creatinine 0.44 - 1.00 mg/dL 8.91  8.97  8.89   Sodium 135 - 145 mmol/L 140  141  140   Potassium 3.5 - 5.1 mmol/L 4.4  4.5  4.3   Chloride 98 - 111 mmol/L 99  100  97   CO2 22 - 32 mmol/L 32  30  29   Calcium  8.9 - 10.3 mg/dL 9.4  9.4  9.4   Total Protein 6.5 - 8.1 g/dL 7.4  7.5  7.7   Total Bilirubin 0.0 - 1.2 mg/dL 0.7  0.9  0.9   Alkaline Phos 38 - 126 U/L 78  75  72   AST 15 - 41 U/L 29  29  28    ALT 0 - 44 U/L 9  11  11        RADIOGRAPHIC STUDIES: I have personally reviewed the radiological images as listed and agreed with the findings in the report. No results found.    Orders Placed This Encounter  Procedures   CT ABDOMEN PELVIS W CONTRAST    Standing Status:   Future    Expected Date:   10/11/2024    Expiration Date:   09/20/2025    If indicated for the ordered procedure, I authorize the administration of contrast media per Radiology protocol:   Yes    Does the patient have a contrast media/X-ray dye allergy?:   No    Preferred imaging location?:   [facility]    If indicated for the ordered procedure, I authorize the administration of oral contrast media per Radiology protocol:   Yes   CBC with Differential (Cancer Center Only)    Standing Status:   Future    Expected Date:   11/01/2024    Expiration Date:   11/01/2025   CMP (Cancer Center only)    Standing Status:   Future     Expected Date:   11/01/2024    Expiration Date:   11/01/2025   Magnesium     Standing Status:   Future    Expected Date:   11/01/2024    Expiration Date:   11/01/2025   CBC with Differential (Cancer Center Only)  Standing Status:   Future    Expected Date:   11/15/2024    Expiration Date:   11/15/2025   CMP (Cancer Center only)    Standing Status:   Future    Expected Date:   11/15/2024    Expiration Date:   11/15/2025   Magnesium     Standing Status:   Future    Expected Date:   11/15/2024    Expiration Date:   11/15/2025   All questions were answered. The patient knows to call the clinic with any problems, questions or concerns. No barriers to learning was detected. The total time spent in the appointment was 25 minutes, including review of chart and various tests results, discussions about plan of care and coordination of care plan     Onita Mattock, MD 09/20/2024     "

## 2024-09-20 NOTE — Assessment & Plan Note (Addendum)
 cT1bN1M0, MSS, TMB 2, BAP1 mutation (+) and MTAP deletion (+) -Diagnosed in Oct 2025 after hospital admission, CT/MRI/PET showed 3.8 x 5.1 cm segment 4A hepatic mass, with additional 1cm hypermetabolic lesion in left lobe, and 1cm right Cardiophrenic Angle adenopathy, again suspicious for nodal metastasis.  - Case reviewed in the tumor board, she is not a candidate for surgical resection due to the extensive disease -She started first-line chemotherapy cisplatin , gemcitabine  and durvalumab  on August 09, 2024, every 2 weeks.

## 2024-09-20 NOTE — Patient Instructions (Signed)
 [facility] - A DEPT OF [place]  Discharge Instructions: Thank you for choosing [facility] to provide your oncology and hematology care.   If you have a lab appointment with the Cancer Center, please go directly to the Cancer Center and check in at the registration area.   Wear comfortable clothing and clothing appropriate for easy access to any Portacath or PICC line.   We strive to give you quality time with your provider. You may need to reschedule your appointment if you arrive late (15 or more minutes).  Arriving late affects you and other patients whose appointments are after yours.  Also, if you miss three or more appointments without notifying the office, you may be dismissed from the clinic at the provider's discretion.      For prescription refill requests, have your pharmacy contact our office and allow 72 hours for refills to be completed.    Today you received the following chemotherapy and/or immunotherapy agents gemzar  and cisplatin       To help prevent nausea and vomiting after your treatment, we encourage you to take your nausea medication as directed.  BELOW ARE SYMPTOMS THAT SHOULD BE REPORTED IMMEDIATELY: *FEVER GREATER THAN 100.4 F (38 C) OR HIGHER *CHILLS OR SWEATING *NAUSEA AND VOMITING THAT IS NOT CONTROLLED WITH YOUR NAUSEA MEDICATION *UNUSUAL SHORTNESS OF BREATH *UNUSUAL BRUISING OR BLEEDING *URINARY PROBLEMS (pain or burning when urinating, or frequent urination) *BOWEL PROBLEMS (unusual diarrhea, constipation, pain near the anus) TENDERNESS IN MOUTH AND THROAT WITH OR WITHOUT PRESENCE OF ULCERS (sore throat, sores in mouth, or a toothache) UNUSUAL RASH, SWELLING OR PAIN  UNUSUAL VAGINAL DISCHARGE OR ITCHING   Items with * indicate a potential emergency and should be followed up as soon as possible or go to the Emergency Department if any problems should occur.  Please show the CHEMOTHERAPY ALERT CARD or  IMMUNOTHERAPY ALERT CARD at check-in to the Emergency Department and triage nurse.  Should you have questions after your visit or need to cancel or reschedule your appointment, please contact [facility] DE[facility]  Dept: [phone]  and follow the prompts.  Office hours are 8:00 a.m. to 4:30 p.m. Monday - Friday. Please note that voicemails left after 4:00 p.m. may not be returned until the following business day.  We are closed weekends and major holidays. You have access to a nurse at all times for urgent questions. Please call the main number to the clinic Dept: [phone] and follow the prompts.   For any non-urgent questions, you may also contact your provider using MyChart. We now offer e-Visits for anyone 17 and older to request care online for non-urgent symptoms. For details visit [email or web address].   Also download the MyChart app! Go to the app store, search "MyChart", open the app, select [place], and log in with your MyChart username and password.

## 2024-09-22 ENCOUNTER — Other Ambulatory Visit: Payer: Self-pay

## 2024-09-23 ENCOUNTER — Other Ambulatory Visit: Payer: Self-pay

## 2024-09-23 ENCOUNTER — Telehealth: Payer: Self-pay

## 2024-09-23 NOTE — Telephone Encounter (Signed)
 Pt and daughter called stating that the pt is experiencing swelling in her left foot up to the shaft of the leg.  Pt stated it started yesterday after having chemotherapy earlier this week.  Pt confirmed taking Eliquis  for hx of A-Fib.  Pt denied pain in the extremity and stated that the foot is cool to touch.  Pt denied redness in the foot but stated the foot was pale in color.  Pt and daughter denied pitting edema in the foot.  Pt stated she wore her compression hoses yesterday while out at [facility] but removed them when she returned home.  Pt stated she was doing chores around the house until bedtime; therefore, the feet were not elevated nor were compression hoses worn during this time.  Pt gets Gemcitabine  + Cistplatin confirmed that pt is hydrating appropriately.  Pt stated her urine is yellow in color and she's drinking around 3 to 4 bottles of water containing Liquid IV daily.  D/t the pt's hx of CHF recommend pt decrease the amount of Liquid IV to prevent hypertension.  Recommended pt drinking Pedialyte which has less sodium content.  Instructed the pt to wear the compression hoses while doing chores w/in the home and out as well as elevate her feet above the heart while sitting.  Pt and daughter agreed to continue to monitor the pt's foot and BP.  Stated if the pt's status changes, please take the pt to the closes ED/ER for further evaluation.  Notified Dr Lanny and Team of the pt's call.

## 2024-09-25 ENCOUNTER — Encounter: Payer: Self-pay | Admitting: Hematology

## 2024-09-25 ENCOUNTER — Encounter: Payer: Self-pay | Admitting: Nurse Practitioner

## 2024-10-02 NOTE — Progress Notes (Unsigned)
 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  - Case reviewed in the tumor board, she is not a candidate for surgical resection due to the extensive disease -She started first-line chemotherapy cisplatin , gemcitabine  and durvalumab  on August 09, 2024, every 2 weeks. - Tolerating chemotherapy well with minimal side effects.  -Proceed with cycle 3 day 1  cisplatin , gemcitabine , and durvalumab .  Continue with chemotherapy treatments as currently scheduled. -scheduled for Ct abdomen and pelvis on 10/11/2024.     Chemotherapy related fatigue Patient reports minimal fatigue. She states that she is still able to cook meals and clean house. Notes that she does get a bit sleepy at times. She states that she rests when needed. After that, she is able to get back up and continue her activities. Fatigue not interfering with normal activities.   Intrahepatic cholangiocarcinoma  Patient tolerating chemotherapy very well. Minimal side effects from chemo. She presents for Cycle 3 day 1 chemotherapy gemcitabine , cisplatin , and durvalumab  today. She is scheduled for restaging CT of abdomen and pelvis 10/11/2024.   Cytopenias related to chemotherapy  Mild anemia with Hgb 11.2 and Hct 34.8. no requirement for blood transfusion or iron infusion. Platelet count also mildly reduced at 147. These are  expected changes related to her chemotherapy. Will continue to monitor prior to every treatment and make adjustments as indicated.   Plan  Labs reviewed.  -mild anemia and thrombocytopenia.  -CMP unremarkable.  -magnesium  level normal.  -awaiting results for thyroid  panel.  CT abdomen and pelvis scheduled for 10/11/2024.  Labs and presentation are appropriate for treatment today.  Proceed with Cycle 3 day 1 chemotherapy cisplatin , gemcitabine , and durvalumab .  Labs/flush, follow up, and subsequent treatments as scheduled.   The patient understands the plans discussed today and is in agreement with them.  She knows to contact our office if she develops concerns prior to her next appointment.  I provided 25 minutes of face-to-face time during this encounter and > 50% was spent counseling as documented under my assessment and plan.    Powell FORBES Lessen, NP  [facility] - A DEPT OF [facility] [address] [place] [address] Dept: [phone] Dept Fax: [phone]   No orders of the defined types were placed in this encounter.     CHIEF COMPLAINT:  CC: Intrahepatic cholangiocarcinoma  Current Treatment: First-line chemotherapy cisplatin , and durvalumab   INTERVAL HISTORY:  Grover is here today for repeat clinical assessment.  She last saw Dr.Feng on 09/20/2024.  Today, she presents for Cycle 3 day 1 chemotherapy with cisplatin , gemcitabine , and durvalumab . She feels as though she is tolerating chemotherapy very well. She has minimal fatigue. Continues to participate in normal activities. Blood pressure is very high. She states that it is always very high and then gradually goes down throughout treatment. She denies chest pain, chest pressure, or shortness of breath. She denies headaches or  visual disturbances. She denies abdominal pain, nausea, vomiting, or changes in bowel or bladder habits. She denies unusual  bleeding such as blood in the stool, hematuria, hematemesis, or epistaxis.  She denies fevers or chills. She denies pain. Her appetite is good. Her weight has been stable.  I have reviewed the past medical history, past surgical history, social history and family history with the patient and they are unchanged from previous note.  ALLERGIES:  has no known allergies.  MEDICATIONS:  Current Outpatient Medications  Medication Sig Dispense Refill   acetaminophen  (TYLENOL ) 500 MG tablet Take 500 mg by mouth every 6 (six) hours as needed for moderate pain (pain score 4-6).     apixaban  (ELIQUIS ) 5 MG TABS tablet Take 1 tablet (5 mg total) by mouth 2 (two) times daily. 180 tablet 3   hydrochlorothiazide (HYDRODIURIL) 12.5 MG tablet Take 12.5 mg by mouth.     lidocaine -prilocaine  (EMLA ) cream Apply to affected area once 30 g 3   losartan  (COZAAR ) 50 MG tablet Take 50 mg by mouth in the morning and at bedtime.     metoprolol  tartrate (LOPRESSOR ) 25 MG tablet Take 1 tablet (25 mg total) by mouth 2 (two) times daily. 60 tablet 11   omeprazole (PRILOSEC OTC) 20 MG tablet Take 20 mg by mouth daily as needed (for heartburn).     ondansetron  (ZOFRAN ) 8 MG tablet Take 1 tablet (8 mg total) by mouth every 8 (eight) hours as needed for nausea or vomiting. Start on the third day after cisplatin . 30 tablet 1   prochlorperazine  (COMPAZINE ) 10 MG tablet Take 1 tablet (10 mg total) by mouth every 6 (six) hours as needed (Nausea or vomiting). 30 tablet 1   rosuvastatin  (CRESTOR ) 5 MG tablet Take 1 tablet by mouth once daily 90 tablet 1   No current facility-administered medications for this visit.    HISTORY OF PRESENT ILLNESS:   Oncology History  Intrahepatic cholangiocarcinoma (HCC)  06/13/2024 Cancer Staging   Staging form: Intrahepatic Bile Duct, AJCC 8th Edition - Clinical stage from 06/13/2024: Stage IIIB (cT1b, cN1, cM0) - Signed by Lanny Callander, MD on 08/23/2024   07/19/2024 Initial Diagnosis    Intrahepatic cholangiocarcinoma (HCC)   08/09/2024 -  Chemotherapy   Patient is on Treatment Plan : BILIARY TRACT Cisplatin  + Gemcitabine  D1,8 + Durvalumab  (1500) D1 q21d / Durvalumab  (1500) q28d         REVIEW OF SYSTEMS:   Constitutional: Denies fevers, chills or abnormal weight loss Eyes: Denies blurriness of vision Ears, nose, mouth, throat, and face: Denies mucositis or sore throat Respiratory: Denies cough, dyspnea or wheezes Cardiovascular: Denies palpitation, chest discomfort or lower extremity swelling. Blood pressure very elevated at start of visit today.  Gastrointestinal:  Denies nausea, heartburn or change in bowel habits Skin: Denies abnormal skin rashes Lymphatics: Denies new lymphadenopathy or easy bruising Neurological:Denies numbness, tingling or new weaknesses Behavioral/Psych: Mood is stable, no new changes  All other systems were reviewed with the patient and are negative.   VITALS:   Today's Vitals   10/04/24 0833 10/04/24 0834 10/04/24 0835  BP: (!) 206/84 (!) 200/72 (!) 186/78  Pulse: 65    Resp: 17    Temp: (!) 96.8 F (36 C)    SpO2: 98%    Weight: 134 lb (60.8 kg)    PainSc:   0-No pain   Body mass index is 22.3 kg/m.   Wt Readings from Last 3 Encounters:  10/04/24 134 lb (60.8 kg)  09/20/24  134 lb 12.8 oz (61.1 kg)  09/14/24 136 lb 6.4 oz (61.9 kg)    Body mass index is 22.3 kg/m.  Performance status (ECOG): 1 - Symptomatic but completely ambulatory  PHYSICAL EXAM:   GENERAL:alert, no distress and comfortable SKIN: skin color, texture, turgor are normal, no rashes or significant lesions EYES: normal, Conjunctiva are pink and non-injected, sclera clear OROPHARYNX:no exudate, no erythema and lips, buccal mucosa, and tongue normal  NECK: supple, thyroid  normal size, non-tender, without nodularity LYMPH:  no palpable lymphadenopathy in the cervical, axillary or inguinal LUNGS: clear to auscultation and percussion with normal breathing  effort HEART: regular rate & rhythm and no murmurs and no lower extremity edema ABDOMEN:abdomen soft, non-tender and normal bowel sounds Musculoskeletal:no cyanosis of digits and no clubbing  NEURO: alert & oriented x 3 with fluent speech, no focal motor/sensory deficits  LABORATORY DATA:  I have reviewed the data as listed    Component Value Date/Time   NA 140 10/04/2024 0753   NA 142 04/29/2022 1111   K 4.5 10/04/2024 0753   CL 99 10/04/2024 0753   CO2 30 10/04/2024 0753   GLUCOSE 102 (H) 10/04/2024 0753   BUN 20 10/04/2024 0753   BUN 17 04/29/2022 1111   CREATININE 1.00 10/04/2024 0753   CALCIUM  9.4 10/04/2024 0753   PROT 7.6 10/04/2024 0753   ALBUMIN 4.3 10/04/2024 0753   AST 28 10/04/2024 0753   ALT 11 10/04/2024 0753   ALKPHOS 84 10/04/2024 0753   BILITOT 0.8 10/04/2024 0753   GFRNONAA 56 (L) 10/04/2024 0753     Lab Results  Component Value Date   WBC 6.5 10/04/2024   NEUTROABS 4.4 10/04/2024   HGB 11.2 (L) 10/04/2024   HCT 34.8 (L) 10/04/2024   MCV 98.6 10/04/2024   PLT 147 (L) 10/04/2024    "

## 2024-10-04 ENCOUNTER — Inpatient Hospital Stay: Attending: Hematology | Admitting: Nurse Practitioner

## 2024-10-04 ENCOUNTER — Encounter: Payer: Self-pay | Admitting: Nurse Practitioner

## 2024-10-04 ENCOUNTER — Inpatient Hospital Stay: Attending: Hematology

## 2024-10-04 ENCOUNTER — Inpatient Hospital Stay

## 2024-10-04 VITALS — BP 140/99 | HR 75

## 2024-10-04 VITALS — BP 186/78 | HR 65 | Temp 96.8°F | Resp 17 | Wt 134.0 lb

## 2024-10-04 DIAGNOSIS — C221 Intrahepatic bile duct carcinoma: Secondary | ICD-10-CM

## 2024-10-04 LAB — CMP (CANCER CENTER ONLY)
ALT: 11 U/L (ref 0–44)
AST: 28 U/L (ref 15–41)
Albumin: 4.3 g/dL (ref 3.5–5.0)
Alkaline Phosphatase: 84 U/L (ref 38–126)
Anion gap: 10 (ref 5–15)
BUN: 20 mg/dL (ref 8–23)
CO2: 30 mmol/L (ref 22–32)
Calcium: 9.4 mg/dL (ref 8.9–10.3)
Chloride: 99 mmol/L (ref 98–111)
Creatinine: 1 mg/dL (ref 0.44–1.00)
GFR, Estimated: 56 mL/min — ABNORMAL LOW
Glucose, Bld: 102 mg/dL — ABNORMAL HIGH (ref 70–99)
Potassium: 4.5 mmol/L (ref 3.5–5.1)
Sodium: 140 mmol/L (ref 135–145)
Total Bilirubin: 0.8 mg/dL (ref 0.0–1.2)
Total Protein: 7.6 g/dL (ref 6.5–8.1)

## 2024-10-04 LAB — CBC WITH DIFFERENTIAL (CANCER CENTER ONLY)
Abs Immature Granulocytes: 0.01 10*3/uL (ref 0.00–0.07)
Basophils Absolute: 0.1 10*3/uL (ref 0.0–0.1)
Basophils Relative: 1 %
Eosinophils Absolute: 0 10*3/uL (ref 0.0–0.5)
Eosinophils Relative: 1 %
HCT: 34.8 % — ABNORMAL LOW (ref 36.0–46.0)
Hemoglobin: 11.2 g/dL — ABNORMAL LOW (ref 12.0–15.0)
Immature Granulocytes: 0 %
Lymphocytes Relative: 19 %
Lymphs Abs: 1.2 10*3/uL (ref 0.7–4.0)
MCH: 31.7 pg (ref 26.0–34.0)
MCHC: 32.2 g/dL (ref 30.0–36.0)
MCV: 98.6 fL (ref 80.0–100.0)
Monocytes Absolute: 0.8 10*3/uL (ref 0.1–1.0)
Monocytes Relative: 12 %
Neutro Abs: 4.4 10*3/uL (ref 1.7–7.7)
Neutrophils Relative %: 67 %
Platelet Count: 147 10*3/uL — ABNORMAL LOW (ref 150–400)
RBC: 3.53 MIL/uL — ABNORMAL LOW (ref 3.87–5.11)
RDW: 16.1 % — ABNORMAL HIGH (ref 11.5–15.5)
WBC Count: 6.5 10*3/uL (ref 4.0–10.5)
nRBC: 0 % (ref 0.0–0.2)

## 2024-10-04 LAB — TSH: TSH: 12.6 u[IU]/mL — ABNORMAL HIGH (ref 0.350–4.500)

## 2024-10-04 LAB — MAGNESIUM: Magnesium: 1.7 mg/dL (ref 1.7–2.4)

## 2024-10-04 MED ORDER — SODIUM CHLORIDE 0.9 % IV SOLN
25.0000 mg/m2 | Freq: Once | INTRAVENOUS | Status: AC
  Administered 2024-10-04: 42 mg via INTRAVENOUS
  Filled 2024-10-04: qty 42

## 2024-10-04 MED ORDER — SODIUM CHLORIDE 0.9 % IV SOLN
1000.0000 mg/m2 | Freq: Once | INTRAVENOUS | Status: AC
  Administered 2024-10-04: 1672 mg via INTRAVENOUS
  Filled 2024-10-04: qty 43.97

## 2024-10-04 MED ORDER — SODIUM CHLORIDE 0.9 % IV SOLN
1500.0000 mg | Freq: Once | INTRAVENOUS | Status: AC
  Administered 2024-10-04: 1500 mg via INTRAVENOUS
  Filled 2024-10-04: qty 30

## 2024-10-04 MED ORDER — PALONOSETRON HCL INJECTION 0.25 MG/5ML
0.2500 mg | Freq: Once | INTRAVENOUS | Status: AC
  Administered 2024-10-04: 0.25 mg via INTRAVENOUS
  Filled 2024-10-04: qty 5

## 2024-10-04 MED ORDER — MAGNESIUM SULFATE 2 GM/50ML IV SOLN
2.0000 g | Freq: Once | INTRAVENOUS | Status: AC
  Administered 2024-10-04: 2 g via INTRAVENOUS
  Filled 2024-10-04: qty 50

## 2024-10-04 MED ORDER — SODIUM CHLORIDE 0.9 % IV SOLN: INTRAVENOUS | Status: DC

## 2024-10-04 MED ORDER — POTASSIUM CHLORIDE IN NACL 20-0.9 MEQ/L-% IV SOLN
Freq: Once | INTRAVENOUS | Status: AC
  Filled 2024-10-04: qty 1000

## 2024-10-04 MED ORDER — APREPITANT 130 MG/18ML IV EMUL
130.0000 mg | Freq: Once | INTRAVENOUS | Status: AC
  Administered 2024-10-04: 130 mg via INTRAVENOUS
  Filled 2024-10-04: qty 18

## 2024-10-04 NOTE — Patient Instructions (Signed)
 [facility] - A DEPT OF [facility]  Discharge Instructions: Thank you for choosing [facility] to provide your oncology and hematology care.   If you have a lab appointment with the Cancer Center, please go directly to the Cancer Center and check in at the registration area.   Wear comfortable clothing and clothing appropriate for easy access to any Portacath or PICC line.   We strive to give you quality time with your provider. You may need to reschedule your appointment if you arrive late (15 or more minutes).  Arriving late affects you and other patients whose appointments are after yours.  Also, if you miss three or more appointments without notifying the office, you may be dismissed from the clinic at the provider's discretion.      For prescription refill requests, have your pharmacy contact our office and allow 72 hours for refills to be completed.    Today you received the following chemotherapy and/or immunotherapy agents: Imfinzi, Gemzar, Cisplatin      To help prevent nausea and vomiting after your treatment, we encourage you to take your nausea medication as directed.  BELOW ARE SYMPTOMS THAT SHOULD BE REPORTED IMMEDIATELY: *FEVER GREATER THAN 100.4 F (38 C) OR HIGHER *CHILLS OR SWEATING *NAUSEA AND VOMITING THAT IS NOT CONTROLLED WITH YOUR NAUSEA MEDICATION *UNUSUAL SHORTNESS OF BREATH *UNUSUAL BRUISING OR BLEEDING *URINARY PROBLEMS (pain or burning when urinating, or frequent urination) *BOWEL PROBLEMS (unusual diarrhea, constipation, pain near the anus) TENDERNESS IN MOUTH AND THROAT WITH OR WITHOUT PRESENCE OF ULCERS (sore throat, sores in mouth, or a toothache) UNUSUAL RASH, SWELLING OR PAIN  UNUSUAL VAGINAL DISCHARGE OR ITCHING   Items with * indicate a potential emergency and should be followed up as soon as possible or go to the Emergency Department if any problems should occur.  Please show the CHEMOTHERAPY ALERT CARD  or IMMUNOTHERAPY ALERT CARD at check-in to the Emergency Department and triage nurse.  Should you have questions after your visit or need to cancel or reschedule your appointment, please contact [facility] - A DEPT [facility]  Dept: [phone]  and follow the prompts.  Office hours are 8:00 a.m. to 4:30 p.m. Monday - Friday. Please note that voicemails left after 4:00 p.m. may not be returned until the following business day.  We are closed weekends and major holidays. You have access to a nurse at all times for urgent questions. Please call the main number to the clinic Dept: [phone] and follow the prompts.   For any non-urgent questions, you may also contact your provider using MyChart. We now offer e-Visits for anyone 65 and older to request care online for non-urgent symptoms. For details visit [email or web address].   Also download the MyChart app! Go to the app store, search "MyChart", open the app, select [place], and log in with your MyChart username and password.

## 2024-10-05 LAB — T4: T4, Total: 10.8 ug/dL (ref 4.5–12.0)

## 2024-10-11 ENCOUNTER — Ambulatory Visit (HOSPITAL_COMMUNITY)

## 2024-10-18 ENCOUNTER — Inpatient Hospital Stay: Admitting: Hematology

## 2024-10-18 ENCOUNTER — Inpatient Hospital Stay

## 2024-11-01 ENCOUNTER — Inpatient Hospital Stay

## 2024-11-01 ENCOUNTER — Inpatient Hospital Stay: Admitting: Hematology

## 2024-11-02 ENCOUNTER — Inpatient Hospital Stay

## 2024-11-02 ENCOUNTER — Inpatient Hospital Stay: Attending: Hematology

## 2024-11-02 ENCOUNTER — Inpatient Hospital Stay: Admitting: Hematology

## 2024-11-15 ENCOUNTER — Inpatient Hospital Stay

## 2024-11-15 ENCOUNTER — Inpatient Hospital Stay: Admitting: Hematology
